# Patient Record
Sex: Female | Born: 1975 | Race: White | Hispanic: No | Marital: Single | Smoking: Current every day smoker
Health system: Southern US, Community
[De-identification: ages and names within clinical notes are randomized; demographics above are authoritative.]

## PROBLEM LIST (undated history)

## (undated) ENCOUNTER — Emergency Department (HOSPITAL_COMMUNITY): Admission: EM | Discharge status: Absent without leave | Payer: Medicaid Other

## (undated) DIAGNOSIS — F419 Anxiety disorder, unspecified: Secondary | ICD-10-CM

## (undated) DIAGNOSIS — R0600 Dyspnea, unspecified: Secondary | ICD-10-CM

## (undated) DIAGNOSIS — M479 Spondylosis, unspecified: Secondary | ICD-10-CM

## (undated) DIAGNOSIS — R51 Headache: Secondary | ICD-10-CM

## (undated) DIAGNOSIS — E782 Mixed hyperlipidemia: Secondary | ICD-10-CM

## (undated) DIAGNOSIS — G4733 Obstructive sleep apnea (adult) (pediatric): Secondary | ICD-10-CM

## (undated) DIAGNOSIS — D649 Anemia, unspecified: Secondary | ICD-10-CM

## (undated) DIAGNOSIS — F909 Attention-deficit hyperactivity disorder, unspecified type: Secondary | ICD-10-CM

## (undated) DIAGNOSIS — I10 Essential (primary) hypertension: Secondary | ICD-10-CM

## (undated) DIAGNOSIS — F1911 Other psychoactive substance abuse, in remission: Secondary | ICD-10-CM

## (undated) DIAGNOSIS — F4000 Agoraphobia, unspecified: Secondary | ICD-10-CM

## (undated) DIAGNOSIS — M797 Fibromyalgia: Secondary | ICD-10-CM

## (undated) DIAGNOSIS — E119 Type 2 diabetes mellitus without complications: Secondary | ICD-10-CM

## (undated) DIAGNOSIS — S060X9A Concussion with loss of consciousness of unspecified duration, initial encounter: Secondary | ICD-10-CM

## (undated) DIAGNOSIS — M1990 Unspecified osteoarthritis, unspecified site: Secondary | ICD-10-CM

## (undated) DIAGNOSIS — M51369 Other intervertebral disc degeneration, lumbar region without mention of lumbar back pain or lower extremity pain: Secondary | ICD-10-CM

## (undated) DIAGNOSIS — F32A Depression, unspecified: Secondary | ICD-10-CM

## (undated) DIAGNOSIS — M5136 Other intervertebral disc degeneration, lumbar region: Secondary | ICD-10-CM

## (undated) DIAGNOSIS — G4730 Sleep apnea, unspecified: Secondary | ICD-10-CM

## (undated) DIAGNOSIS — G629 Polyneuropathy, unspecified: Secondary | ICD-10-CM

## (undated) DIAGNOSIS — S060XAA Concussion with loss of consciousness status unknown, initial encounter: Secondary | ICD-10-CM

## (undated) DIAGNOSIS — F99 Mental disorder, not otherwise specified: Secondary | ICD-10-CM

## (undated) DIAGNOSIS — K219 Gastro-esophageal reflux disease without esophagitis: Secondary | ICD-10-CM

## (undated) DIAGNOSIS — J189 Pneumonia, unspecified organism: Secondary | ICD-10-CM

## (undated) DIAGNOSIS — J45909 Unspecified asthma, uncomplicated: Secondary | ICD-10-CM

## (undated) DIAGNOSIS — T7840XA Allergy, unspecified, initial encounter: Secondary | ICD-10-CM

## (undated) DIAGNOSIS — R6889 Other general symptoms and signs: Secondary | ICD-10-CM

## (undated) DIAGNOSIS — E785 Hyperlipidemia, unspecified: Secondary | ICD-10-CM

## (undated) DIAGNOSIS — J449 Chronic obstructive pulmonary disease, unspecified: Secondary | ICD-10-CM

## (undated) DIAGNOSIS — E559 Vitamin D deficiency, unspecified: Secondary | ICD-10-CM

## (undated) DIAGNOSIS — R7989 Other specified abnormal findings of blood chemistry: Secondary | ICD-10-CM

## (undated) DIAGNOSIS — G4739 Other sleep apnea: Secondary | ICD-10-CM

## (undated) DIAGNOSIS — G43909 Migraine, unspecified, not intractable, without status migrainosus: Secondary | ICD-10-CM

## (undated) DIAGNOSIS — F319 Bipolar disorder, unspecified: Secondary | ICD-10-CM

## (undated) DIAGNOSIS — M722 Plantar fascial fibromatosis: Secondary | ICD-10-CM

## (undated) HISTORY — DX: Unspecified osteoarthritis, unspecified site: M19.90

## (undated) HISTORY — PX: ESOPHAGEAL DILATION: SHX303

## (undated) HISTORY — DX: Mental disorder, not otherwise specified: F99

## (undated) HISTORY — DX: Depression, unspecified: F32.A

## (undated) HISTORY — DX: Attention-deficit hyperactivity disorder, unspecified type: F90.9

## (undated) HISTORY — DX: Agoraphobia, unspecified: F40.00

## (undated) HISTORY — PX: CYST REMOVAL HAND: SHX6279

## (undated) HISTORY — DX: Sleep apnea, unspecified: G47.30

## (undated) HISTORY — DX: Plantar fascial fibromatosis: M72.2

## (undated) HISTORY — DX: Obstructive sleep apnea (adult) (pediatric): G47.33

## (undated) HISTORY — DX: Mixed hyperlipidemia: E78.2

## (undated) HISTORY — PX: FOOT SURGERY: SHX648

## (undated) HISTORY — DX: Other psychoactive substance abuse, in remission: F19.11

## (undated) HISTORY — DX: Anxiety disorder, unspecified: F41.9

## (undated) HISTORY — DX: Essential (primary) hypertension: I10

## (undated) HISTORY — DX: Allergy, unspecified, initial encounter: T78.40XA

## (undated) HISTORY — DX: Vitamin D deficiency, unspecified: E55.9

## (undated) HISTORY — DX: Migraine, unspecified, not intractable, without status migrainosus: G43.909

## (undated) HISTORY — DX: Morbid (severe) obesity due to excess calories: E66.01

## (undated) HISTORY — DX: Hyperlipidemia, unspecified: E78.5

## (undated) HISTORY — DX: Chronic obstructive pulmonary disease, unspecified: J44.9

## (undated) HISTORY — PX: WISDOM TOOTH EXTRACTION: SHX21

## (undated) HISTORY — DX: Anemia, unspecified: D64.9

## (undated) HISTORY — DX: Other specified abnormal findings of blood chemistry: R79.89

---

## 2001-01-11 ENCOUNTER — Ambulatory Visit (HOSPITAL_COMMUNITY): Admission: RE | Admit: 2001-01-11 | Discharge: 2001-01-11 | Payer: Self-pay | Admitting: Pediatrics

## 2001-01-11 ENCOUNTER — Encounter: Payer: Self-pay | Admitting: Pediatrics

## 2001-03-11 ENCOUNTER — Encounter: Payer: Self-pay | Admitting: Pediatrics

## 2001-03-11 ENCOUNTER — Ambulatory Visit (HOSPITAL_COMMUNITY): Admission: RE | Admit: 2001-03-11 | Discharge: 2001-03-11 | Payer: Self-pay | Admitting: Pediatrics

## 2002-03-15 ENCOUNTER — Emergency Department (HOSPITAL_COMMUNITY): Admission: EM | Admit: 2002-03-15 | Discharge: 2002-03-15 | Payer: Self-pay | Admitting: *Deleted

## 2002-03-15 ENCOUNTER — Encounter: Payer: Self-pay | Admitting: Emergency Medicine

## 2002-12-10 ENCOUNTER — Emergency Department (HOSPITAL_COMMUNITY): Admission: EM | Admit: 2002-12-10 | Discharge: 2002-12-10 | Payer: Self-pay | Admitting: Emergency Medicine

## 2002-12-10 ENCOUNTER — Encounter: Payer: Self-pay | Admitting: Emergency Medicine

## 2002-12-12 ENCOUNTER — Ambulatory Visit (HOSPITAL_COMMUNITY): Admission: RE | Admit: 2002-12-12 | Discharge: 2002-12-12 | Payer: Self-pay | Admitting: Orthopaedic Surgery

## 2003-02-08 ENCOUNTER — Ambulatory Visit (HOSPITAL_COMMUNITY): Admission: RE | Admit: 2003-02-08 | Discharge: 2003-02-08 | Payer: Self-pay | Admitting: Pediatrics

## 2003-02-08 ENCOUNTER — Encounter: Payer: Self-pay | Admitting: Pediatrics

## 2003-02-26 ENCOUNTER — Ambulatory Visit: Admission: RE | Admit: 2003-02-26 | Discharge: 2003-02-26 | Payer: Self-pay | Admitting: Pediatrics

## 2004-01-18 ENCOUNTER — Ambulatory Visit (HOSPITAL_COMMUNITY): Admission: RE | Admit: 2004-01-18 | Discharge: 2004-01-18 | Payer: Self-pay | Admitting: Family Medicine

## 2004-03-21 ENCOUNTER — Ambulatory Visit (HOSPITAL_COMMUNITY): Admission: RE | Admit: 2004-03-21 | Discharge: 2004-03-21 | Payer: Self-pay | Admitting: Pediatrics

## 2004-04-25 ENCOUNTER — Ambulatory Visit (HOSPITAL_COMMUNITY): Admission: RE | Admit: 2004-04-25 | Discharge: 2004-04-25 | Payer: Self-pay | Admitting: Pulmonary Disease

## 2004-05-19 ENCOUNTER — Ambulatory Visit: Payer: Self-pay | Admitting: *Deleted

## 2004-05-20 ENCOUNTER — Ambulatory Visit (HOSPITAL_COMMUNITY): Admission: RE | Admit: 2004-05-20 | Discharge: 2004-05-20 | Payer: Self-pay | Admitting: Pulmonary Disease

## 2004-07-13 HISTORY — PX: ESOPHAGOGASTRODUODENOSCOPY: SHX1529

## 2004-09-29 ENCOUNTER — Ambulatory Visit (HOSPITAL_COMMUNITY): Admission: RE | Admit: 2004-09-29 | Discharge: 2004-09-29 | Payer: Self-pay | Admitting: Pediatrics

## 2004-12-17 ENCOUNTER — Ambulatory Visit: Payer: Self-pay | Admitting: Internal Medicine

## 2005-01-07 ENCOUNTER — Ambulatory Visit: Payer: Self-pay | Admitting: Internal Medicine

## 2005-01-07 ENCOUNTER — Ambulatory Visit (HOSPITAL_COMMUNITY): Admission: RE | Admit: 2005-01-07 | Discharge: 2005-01-07 | Payer: Self-pay | Admitting: Internal Medicine

## 2005-02-27 ENCOUNTER — Ambulatory Visit: Payer: Self-pay | Admitting: Internal Medicine

## 2005-09-08 ENCOUNTER — Encounter (HOSPITAL_COMMUNITY): Admission: RE | Admit: 2005-09-08 | Discharge: 2005-10-08 | Payer: Self-pay | Admitting: Pediatrics

## 2005-12-18 ENCOUNTER — Ambulatory Visit (HOSPITAL_COMMUNITY): Admission: RE | Admit: 2005-12-18 | Discharge: 2005-12-18 | Payer: Self-pay | Admitting: Pediatrics

## 2005-12-25 ENCOUNTER — Ambulatory Visit (HOSPITAL_COMMUNITY): Admission: RE | Admit: 2005-12-25 | Discharge: 2005-12-25 | Payer: Self-pay | Admitting: Pediatrics

## 2006-02-12 ENCOUNTER — Emergency Department (HOSPITAL_COMMUNITY): Admission: EM | Admit: 2006-02-12 | Discharge: 2006-02-12 | Payer: Self-pay | Admitting: Emergency Medicine

## 2006-10-07 ENCOUNTER — Emergency Department (HOSPITAL_COMMUNITY): Admission: EM | Admit: 2006-10-07 | Discharge: 2006-10-07 | Payer: Self-pay | Admitting: Emergency Medicine

## 2010-03-16 ENCOUNTER — Emergency Department (HOSPITAL_COMMUNITY): Admission: EM | Admit: 2010-03-16 | Discharge: 2010-03-16 | Payer: Self-pay | Admitting: Emergency Medicine

## 2010-09-25 LAB — URINALYSIS, ROUTINE W REFLEX MICROSCOPIC
Ketones, ur: NEGATIVE mg/dL
Leukocytes, UA: NEGATIVE
Protein, ur: NEGATIVE mg/dL
Specific Gravity, Urine: 1.02 (ref 1.005–1.030)
Urobilinogen, UA: 0.2 mg/dL (ref 0.0–1.0)

## 2010-09-25 LAB — DIFFERENTIAL
Basophils Absolute: 0 10*3/uL (ref 0.0–0.1)
Basophils Relative: 0 % (ref 0–1)
Eosinophils Absolute: 0.1 10*3/uL (ref 0.0–0.7)
Eosinophils Relative: 2 % (ref 0–5)
Lymphocytes Relative: 20 % (ref 12–46)
Monocytes Absolute: 0.5 10*3/uL (ref 0.1–1.0)
Neutro Abs: 4 10*3/uL (ref 1.7–7.7)
Neutrophils Relative %: 68 % (ref 43–77)

## 2010-09-25 LAB — CBC
Hemoglobin: 13.2 g/dL (ref 12.0–15.0)
MCHC: 34.3 g/dL (ref 30.0–36.0)
RBC: 4.25 MIL/uL (ref 3.87–5.11)

## 2010-09-25 LAB — PREGNANCY, URINE: Preg Test, Ur: POSITIVE

## 2010-09-25 LAB — BASIC METABOLIC PANEL
BUN: 8 mg/dL (ref 6–23)
CO2: 23 mEq/L (ref 19–32)
GFR calc Af Amer: >60 mL/min (ref >60)
GFR calc non Af Amer: >60 mL/min (ref >60)
Glucose, Bld: 110 mg/dL — ABNORMAL HIGH (ref 70–99)

## 2010-09-25 LAB — TYPE AND SCREEN: Antibody Screen: NEGATIVE

## 2010-11-28 NOTE — Op Note (Signed)
NAME:  [name], ALVERSON                          ACCOUNT NO.:  [id number]   MEDICAL RECORD NO.:  [id number]                   PATIENT TYPE:  AMB   LOCATION:  DAY                                  FACILITY:  [facility]   PHYSICIAN:  J. Darreld Mclean, M.D.              DATE OF BIRTH:  [birth year]   DATE OF PROCEDURE:  12/12/2002  DATE OF DISCHARGE:                                 OPERATIVE REPORT   PREOPERATIVE DIAGNOSIS:  Ganglion cyst, left hand long finger, volar base.   POSTOPERATIVE DIAGNOSIS:  Ganglion cyst, left hand long finger, volar base.   PROCEDURE:  Excision, ganglion cyst, off the flexor tendon to the left long  finger at the base.   ANESTHESIA:  Bier block.   TOURNIQUET TIME:  Please refer to anesthesia records for total tourniquet  time.   SURGEON:  J. Darreld Mclean, M.D.   ASSISTANT:  Candace Cruise, P.A.   DRAINS:  None.   CLINICAL HISTORY:  The patient is a 35 year old female with a history of  pain and tenderness, left long finger, because of a ganglion cyst the size  of a two small grape seeds.  It is painful when she squeezes or grabs  something.  No paresthesias.  No history of trauma.  It has not gotten any  better, and she desires to have excision.  Risks and imponderables of  excision of the procedure were explained, particularly that this thing could  recur over time.   DESCRIPTION OF PROCEDURE:  The patient was placed supine on the operating  room table and Bier block anesthesia was given.  We re-verified that we were  doing this patient and we were doing her left long finger.  A small incision  made directly over the lesion and the ganglion was identified.  The cyst was  exposed, the neurovascular bundle protected.  The cyst was right on top of  the tendon sheath and once this was brought forward, the vein was exposed.  The cyst came off very easily.  The neurovascular bundle was inspected with  no apparent injury.  The wound reapproximated using 3-0 nylon  interrupted  vertical mattress nylon.  A sterile dressing applied, bulky dressing  applied, tourniquet deflated in the usual fashion after Bier block.  The  patient tolerated the procedure well and went to recovery in good condition.  Appropriate analgesia given for pain.  Will see the patient in the office in  approximately 10 days.  If difficulty or any problems, contact me through  the office or the hospital beeper system.  She has the numbers.                                               Teola Bradley, M.D.    JWK/MEDQ  D:  12/12/2002  T:  12/12/2002  Job:  [id number]

## 2010-11-28 NOTE — Procedures (Signed)
[name], [name]                ACCOUNT NO.:  [id number]   MEDICAL RECORD NO.:  [id number]          PATIENT TYPE:  OUT   LOCATION:  RAD                           FACILITY:  [facility]   PHYSICIAN:  Edward L. Juanetta Gosling, M.D.DATE OF BIRTH:  09-03-1975   DATE OF PROCEDURE:  DATE OF DISCHARGE:  04/25/2004                              PULMONARY FUNCTION TEST   Spirometry is normal.     Edwa   ELH/MEDQ  D:  05/05/2004  T:  05/05/2004  Job:  [id number]   cc:   Jeoffrey Massed, MD  [address]  [place]  [place] [address]  Fax: [phone]

## 2010-11-28 NOTE — Op Note (Signed)
[name], [name]                ACCOUNT NO.:  [id number]   MEDICAL RECORD NO.:  [id number]          PATIENT TYPE:  AMB   LOCATION:  DAY                           FACILITY:  [facility]   PHYSICIAN:  Lionel December, M.D.    DATE OF BIRTH:  [birth year]   DATE OF PROCEDURE:  01/07/2005  DATE OF DISCHARGE:                                 OPERATIVE REPORT   PROCEDURE:  Esophagogastroduodenoscopy with esophageal dilation.   INDICATION:  [name] is a 35 year old Caucasian female with least a five-year  history of GERD, who is presently maintained on double-dose PPI.  Her  heartburn and regurgitation are well controlled, but she still has a feeling  of a lump in her throat. She also has intermittent dysphagia. She is  undergoing diagnostic/therapeutic EGD. The procedure risks were reviewed the  patient. Informed consent was obtained.   PREMEDICATION:  Cetacaine spray for pharyngeal topical anesthesia, Demerol  40 mg IV and Versed 12 mg IV.   FINDINGS:  The procedure was performed in the endoscopy suite. The patient's  vital signs and O2 saturation were monitored during the procedure and  remained stable. The patient was placed left lateral position. An Olympus  videoscope was passed via oropharynx without any difficulty into the  esophagus.   Esophagus:  The mucosa of the esophagus was normal throughout. The GE  junction was at 36 cm and hiatus at 38. She had a small sliding hiatal  hernia.   Stomach:  It was empty and distended very well with insufflation. Folds of  the proximal stomach were normal. Examination mucosa at the gastric body,  antrum, pyloric channel, as well as angularis, fundus and cardia was normal.   Duodenum:  Bulbar mucosa was normal. The scope was passed into the second  part of duodenum. The mucosa and folds were normal. The endoscope was  withdrawn.   The esophagus was dilated by passing a 56 [place] Maloney dilator to full  insertion. As the dilator was withdrawn, the  endoscope was passed again and  there was no mucosal injury noted to the esophagus. The endoscope was  withdrawn. The patient tolerated the procedure well.   FINAL DIAGNOSIS:  Small sliding hiatal hernia, otherwise normal  esophagogastroduodenoscopy.   The esophagus was dilated by passing a 56 [place] Maloney dilator.   RECOMMENDATIONS:  Antireflux measures reinforced. Will switch her to Xigris  40 mg b.i.d. She will pick up samples from the office. She will call the  office with a progress report. If she does not do any better with Xigris,  will try another PPI prior to considering pH study.       NR/MEDQ  D:  01/07/2005  T:  01/07/2005  Job:  [id number]   cc:   Jeoffrey Massed, MD  [address]  [place]  [place] [address]  Fax: [phone]

## 2010-11-28 NOTE — Procedures (Signed)
NAMECARRA, BRINDLEY                ACCOUNT NO.:  [id number]   MEDICAL RECORD NO.:  [id number]          PATIENT TYPE:  OUT   LOCATION:  RAD                           FACILITY:  [facility]   PHYSICIAN:  Vida Roller, M.D.   DATE OF BIRTH:  Apr 22, 1976   DATE OF PROCEDURE:  05/20/2004  DATE OF DISCHARGE:                                  ECHOCARDIOGRAM   Tape #[id number].  Tape count is [phone].   REASON FOR CONSULTATION:  This is a 35 year old woman with shortness of  breath and obesity   FINDINGS:  Technical quality of the study is difficult.   M-MODE TRACING:  Aorta - 30 mm.  Left atrium - 49 mm.  Septum - 12 mm.  Posterior wall - 12 mm.  Left ventricular diastolic dimension - 47 mm.  Left ventricular systolic dimension - 36 mm.   2-D AND DOPPLER IMAGING:  The left ventricle is normal size with mildly  depressed left ventricular systolic function estimated at 50-55% although  this is a difficult assessment due to the technical quality of the study.   The right ventricle appears to be mildly dilated with mildly depressed  systolic function.   Both atria are also dilated.   The aortic valve is morphologic unremarkable with no evidence of stenosis or  regurgitation.   The mitral valve is morphologic unremarkable with no stenosis or  regurgitation.   The tricuspid valve has no regurgitation.   The pulmonic valve is not well seen.   The inferior vena cava appears to be normal size.   There is no pericardial effusion.   The ascending aorta is not well seen.     Trey Paula   JH/MEDQ  D:  05/20/2004  T:  05/20/2004  Job:  [id number]

## 2010-11-28 NOTE — Consult Note (Signed)
[name]                ACCOUNT NO.:  [id number]   MEDICAL RECORD NO.:  [id number]           PATIENT TYPE:   LOCATION:                                 FACILITY:   PHYSICIAN:  Lionel December, M.D.    DATE OF BIRTH:  [birth year]   DATE OF CONSULTATION:  12/17/2004  DATE OF DISCHARGE:                                   CONSULTATION   CHIEF COMPLAINT:  Trouble swallowing, acid reflux.   REQUESTING PHYSICIAN:  Jeoffrey Massed, MD   HISTORY OF PRESENT ILLNESS:  Patient is a 35 year old Caucasian female with  history of chronic gastroesophageal reflux disease and chronic constipation  who presents today for further evaluation of dysphagia.  For the past three  months she has had a sensation of fullness in her throat.  She notes is more  and more throughout the day and especially at nighttime.  At times she feels  like it may be worse with postnasal drip.  When she swallows saliva she  feels like she has a lump in her throat.  When she eats sticky foods or  foods with nuts she really has the problem.  She denies any nausea or  vomiting, odynophagia.  Her heartburn symptoms are intermittently  controlled.  Six months ago her allergist increased her Prevacid to b.i.d.  Her nocturnal heartburn has been better.  She has chronic constipation,  generally has three bowel movements.  She takes Milk of Magnesia at least  two to three times a week with good results.  Denies melena or rectal  bleeding.  She does have abdominal cramps at times with bowel movements.  Denies any nausea or vomiting.  She never had an upper endoscopy.  She  reports having an ultrasound of her neck which was negative.   CURRENT MEDICATIONS:  1.  Prevacid 30 mg b.i.d.  2.  Advair b.i.d.  3.  Xanax 0.25 mg q.h.s.  4.  Remeron 30 mg q.h.s.  5.  Singulair 10 mg q.h.s.  6.  Allegra D b.i.d.  7.  Cranberry with vitamin C two b.i.d.  8.  Gingko daily.   ALLERGIES:  PENICILLIN causes rash.   PAST MEDICAL  HISTORY:  1.  Gastroesophageal reflux disease.  2.  Asthma.  3.  Allergies.  4.  Depression.  5.  Chronic constipation.  6.  Left hand ganglion cyst removal.   FAMILY HISTORY:  Negative for colorectal cancer.  Grandmother has history of  peptic ulcer disease and hiatal hernia and has had surgery for this.   SOCIAL HISTORY:  She is single.  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  She is a Physicist, medical in business administration.  She  occasionally smokes approximately four times a year.  She rarely consumes  alcohol.   REVIEW OF SYSTEMS:  See HPI for GI.  CONSTITUTIONAL:  Denies weight loss.  CARDIOPULMONARY:  She has had some difficulty with asthma during this last  month or two with her allergies, has some wheezing intermittently.  No chest  pain or productive cough.   PHYSICAL EXAMINATION:  VITAL SIGNS:  Weight 314, temperature 98.2, blood  pressure 124/70, pulse 72.  GENERAL:  Pleasant, morbidly obese Caucasian female in no acute distress.  SKIN:  Warm and dry, no jaundice.  HEENT:  Pupils are equal, round, and reactive to light.  Conjunctivae are  pink.  Sclerae nonicteric.  Oropharyngeal mucosa moist and pink.  No  lesions, erythema, or exudate.  No lymphadenopathy, thyromegaly.  CHEST:  Lungs are clear to auscultation.  CARDIAC:  Regular rate and rhythm.  Normal S1, S2.  No murmurs, rubs, or  gallops.  ABDOMEN:  Positive bowel sounds, obese, but symmetrical.  Soft, nontender.  No organomegaly or masses appreciated, but limited due to body habitus.  EXTREMITIES:  No edema.   IMPRESSION:  [name] is a 35 year old lady with chronic gastroesophageal  reflux disease of more than five years' duration who for the past three  months has had a feeling of fullness in her throat and difficulty swallowing  certain foods.  This may be globus hystericus; however, given history of  gastroesophageal reflux disease and new onset symptoms we do need to  consider  esophageal stricture, web, or ring.  She has never had an upper  endoscopy and needs to have one for surveillance purposes to rule out  complications such as Barrett's esophagus as well.  With regards to her  chronic constipation she is content with her current regimen of Milk of  Magnesia and is doing quite well.   PLAN:  1.  EGD plus/minus esophageal dilatation in the near future.  2.  She will continue Prevacid 30 mg b.i.d. for now.  3.  If she fails to have any response to esophageal dilatation and no      findings on EGD she may ultimately need to have ENT referral.       LL/MEDQ  D:  12/17/2004  T:  12/17/2004  Job:  [id number]

## 2010-11-28 NOTE — Procedures (Signed)
NAMESUSAN, [name]                ACCOUNT NO.:  [id number]   MEDICAL RECORD NO.:  [id number]          PATIENT TYPE:  OUT   LOCATION:  RESP                          FACILITY:  [facility]   PHYSICIAN:  Edward L. Juanetta Gosling, M.D.DATE OF BIRTH:  Aug 11, 1975   DATE OF PROCEDURE:  DATE OF DISCHARGE:                              PULMONARY FUNCTION TEST   1.  Spirometry shows no definite ventilatory defect with evidence of airflow      obstruction.  2.  Lung volumes are normal.  3.  DLCO is moderately reduced.  4.  Arterial blood gases are normal.      Edward L. Juanetta Gosling, M.D.  Electronically Signed     ELH/MEDQ  D:  12/26/2005  T:  12/26/2005  Job:  [id number]   cc:   Francoise Schaumann. Milford Cage DO, FAAP  Fax: [phone]

## 2012-01-09 ENCOUNTER — Other Ambulatory Visit: Payer: Self-pay | Admitting: Family Medicine

## 2012-12-09 ENCOUNTER — Encounter (HOSPITAL_COMMUNITY): Payer: Self-pay | Admitting: *Deleted

## 2012-12-09 ENCOUNTER — Emergency Department (HOSPITAL_COMMUNITY)
Admission: EM | Admit: 2012-12-09 | Discharge: 2012-12-09 | Disposition: A | Discharge status: Other Acute Inpatient Hospital | Payer: MEDICAID | Source: Home / Self Care

## 2012-12-09 ENCOUNTER — Inpatient Hospital Stay (HOSPITAL_COMMUNITY)
Admission: AD | Admit: 2012-12-09 | Discharge: 2012-12-13 | DRG: 885 | Disposition: A | Discharge status: Home / Self Care | Payer: MEDICAID | Source: Intra-hospital | Attending: Psychiatry | Admitting: Psychiatry

## 2012-12-09 DIAGNOSIS — F29 Unspecified psychosis not due to a substance or known physiological condition: Secondary | ICD-10-CM

## 2012-12-09 DIAGNOSIS — J45909 Unspecified asthma, uncomplicated: Secondary | ICD-10-CM | POA: Diagnosis present

## 2012-12-09 DIAGNOSIS — Z79899 Other long term (current) drug therapy: Secondary | ICD-10-CM

## 2012-12-09 DIAGNOSIS — E876 Hypokalemia: Secondary | ICD-10-CM | POA: Diagnosis present

## 2012-12-09 DIAGNOSIS — M797 Fibromyalgia: Secondary | ICD-10-CM

## 2012-12-09 DIAGNOSIS — F3160 Bipolar disorder, current episode mixed, unspecified: Principal | ICD-10-CM | POA: Diagnosis present

## 2012-12-09 DIAGNOSIS — R45851 Suicidal ideations: Secondary | ICD-10-CM

## 2012-12-09 DIAGNOSIS — F3162 Bipolar disorder, current episode mixed, moderate: Secondary | ICD-10-CM

## 2012-12-09 DIAGNOSIS — IMO0001 Reserved for inherently not codable concepts without codable children: Secondary | ICD-10-CM | POA: Diagnosis present

## 2012-12-09 HISTORY — DX: Unspecified asthma, uncomplicated: J45.909

## 2012-12-09 HISTORY — DX: Fibromyalgia: M79.7

## 2012-12-09 HISTORY — DX: Headache: R51

## 2012-12-09 LAB — RAPID URINE DRUG SCREEN, HOSP PERFORMED
Cocaine: NOT DETECTED
Opiates: POSITIVE — AB
Tetrahydrocannabinol: NOT DETECTED

## 2012-12-09 LAB — URINALYSIS, ROUTINE W REFLEX MICROSCOPIC
Leukocytes, UA: NEGATIVE
Nitrite: NEGATIVE
Specific Gravity, Urine: >1.03 — ABNORMAL HIGH (ref 1.005–1.030)
pH: 5.5 (ref 5.0–8.0)

## 2012-12-09 LAB — ETHANOL: Alcohol, Ethyl (B): <11 mg/dL (ref 0–11)

## 2012-12-09 LAB — COMPREHENSIVE METABOLIC PANEL
ALT: 12 U/L (ref 0–35)
AST: 17 U/L (ref 0–37)
Albumin: 4.4 g/dL (ref 3.5–5.2)
Alkaline Phosphatase: 92 U/L (ref 39–117)
Calcium: 9.2 mg/dL (ref 8.4–10.5)
GFR calc non Af Amer: 81 mL/min — ABNORMAL LOW (ref >90)
Glucose, Bld: 107 mg/dL — ABNORMAL HIGH (ref 70–99)
Sodium: 140 mEq/L (ref 135–145)

## 2012-12-09 LAB — URINE MICROSCOPIC-ADD ON

## 2012-12-09 LAB — CBC
HCT: 40.7 % (ref 36.0–46.0)
Hemoglobin: 14.3 g/dL (ref 12.0–15.0)
MCHC: 35.1 g/dL (ref 30.0–36.0)
MCV: 91.7 fL (ref 78.0–100.0)
RBC: 4.44 MIL/uL (ref 3.87–5.11)

## 2012-12-09 MED ORDER — LORAZEPAM 2 MG/ML IJ SOLN
1.0000 mg | Freq: Once | INTRAMUSCULAR | Status: AC
  Administered 2012-12-09: 1 mg via INTRAVENOUS
  Filled 2012-12-09: qty 1

## 2012-12-09 MED ORDER — SODIUM CHLORIDE 0.9 % IV SOLN
INTRAVENOUS | Status: DC
  Administered 2012-12-09: 03:00:00 via INTRAVENOUS

## 2012-12-09 MED ORDER — ZIPRASIDONE MESYLATE 20 MG IM SOLR
20.0000 mg | Freq: Once | INTRAMUSCULAR | Status: AC
  Administered 2012-12-09: 20 mg via INTRAMUSCULAR

## 2012-12-09 MED ORDER — DIPHENHYDRAMINE HCL 50 MG/ML IJ SOLN
25.0000 mg | Freq: Once | INTRAMUSCULAR | Status: AC
Start: 2012-12-09 — End: 2012-12-09
  Administered 2012-12-09: 25 mg via INTRAVENOUS
  Filled 2012-12-09: qty 1

## 2012-12-09 NOTE — ED Notes (Signed)
Pt sleeping at present, normal rise and fall of chest. Officer at bedside, pt/ shackle to right lower leg.

## 2012-12-09 NOTE — ED Notes (Addendum)
Pt here screaming that someone has taken her baby, she has been shot with a gun, she is going to hell, and just keeps talking on and on about how bad her life is. Brother states pt is schizophrenic but has not been diagnosed.

## 2012-12-09 NOTE — ED Notes (Signed)
[name] (ACT) will be coming to see the Pt.  Nurse aware.

## 2012-12-09 NOTE — ED Provider Notes (Signed)
History     CSN: [id number]  Arrival date & time 12/09/12  0024   First MD Initiated Contact with Patient 12/09/12 0037      Chief Complaint  Patient presents with  . V70.1    (Consider location/radiation/quality/duration/timing/severity/associated sxs/prior treatment) HPI Hx per EMS and patients brother - reportedly is prescribed psychiatric medications and he is unable to say if she has been taking them.  Tonight police were called for bizarre behavior at home. She has a 79 y/o daughter at home but began screaming that someone has taken her baby, that 32 men took her baby then shot her and that she is going to hell.  EMS called, provided ativan with no relief. On arrival to the ED, continues to have persistent outbursts, yelling, acting bizarre, unable to provide any reliable history or answer questions. Geodon ordered. Level 5 caveat applies.   History reviewed. No pertinent past medical history.  History reviewed. No pertinent past surgical history.  No family history on file.  History  Substance Use Topics  . Smoking status: Not on file  . Smokeless tobacco: Not on file  . Alcohol Use: Not on file    OB History   Grav Para Term Preterm Abortions TAB SAB Ect Mult Living                  Review of Systems  Unable to perform ROS level 5 caveat applies  Allergies  Review of patient's allergies indicates not on file.  Home Medications  No current outpatient prescriptions on file.  There were no vitals taken for this visit.  Physical Exam  Constitutional: She appears well-developed and well-nourished.  HENT:  Head: Normocephalic and atraumatic.  Eyes: EOM are normal. Pupils are equal, round, and reactive to light.  Neck: Neck supple.  Cardiovascular: Regular rhythm and intact distal pulses.   Pulmonary/Chest: Effort normal. No respiratory distress.  Musculoskeletal: Normal range of motion. She exhibits no edema.  Neurological:  Awake, ambulating, no gross  motor deficits   Skin: Skin is warm and dry.  Psychiatric:  Bizarre and uncooperative behavior    ED Course  Procedures (including critical care time)  Results for orders placed during the hospital encounter of 12/09/12  CBC      Result Value Range   WBC 9.9  4.0 - 10.5 K/uL   RBC 4.44  3.87 - 5.11 MIL/uL   Hemoglobin 14.3  12.0 - 15.0 g/dL   HCT 78.4  69.6 - 29.5 %   MCV 91.7  78.0 - 100.0 fL   MCH 32.2  26.0 - 34.0 pg   MCHC 35.1  30.0 - 36.0 g/dL   RDW 28.4  13.2 - 44.0 %   Platelets 206  150 - 400 K/uL  COMPREHENSIVE METABOLIC PANEL      Result Value Range   Sodium 140  135 - 145 mEq/L   Potassium 3.3 (*) 3.5 - 5.1 mEq/L   Chloride 104  96 - 112 mEq/L   CO2 24  19 - 32 mEq/L   Glucose, Bld 107 (*) 70 - 99 mg/dL   BUN 8  6 - 23 mg/dL   Creatinine, Ser 1.02  0.50 - 1.10 mg/dL   Calcium 9.2  8.4 - 72.5 mg/dL   Total Protein 7.5  6.0 - 8.3 g/dL   Albumin 4.4  3.5 - 5.2 g/dL   AST 17  0 - 37 U/L   ALT 12  0 - 35 U/L   Alkaline  Phosphatase 92  39 - 117 U/L   Total Bilirubin 0.4  0.3 - 1.2 mg/dL   GFR calc non Af Amer 81 (*) >90 mL/min   GFR calc Af Amer >90  >90 mL/min  URINALYSIS, ROUTINE W REFLEX MICROSCOPIC      Result Value Range   Color, Urine YELLOW  YELLOW   APPearance HAZY (*) CLEAR   Specific Gravity, Urine >1.030 (*) 1.005 - 1.030   pH 5.5  5.0 - 8.0   Glucose, UA NEGATIVE  NEGATIVE mg/dL   Hgb urine dipstick TRACE (*) NEGATIVE   Bilirubin Urine NEGATIVE  NEGATIVE   Ketones, ur TRACE (*) NEGATIVE mg/dL   Protein, ur 30 (*) NEGATIVE mg/dL   Urobilinogen, UA 0.2  0.0 - 1.0 mg/dL   Nitrite NEGATIVE  NEGATIVE   Leukocytes, UA NEGATIVE  NEGATIVE  URINE RAPID DRUG SCREEN (HOSP PERFORMED)      Result Value Range   Opiates POSITIVE (*) NONE DETECTED   Cocaine NONE DETECTED  NONE DETECTED   Benzodiazepines POSITIVE (*) NONE DETECTED   Amphetamines NONE DETECTED  NONE DETECTED   Tetrahydrocannabinol NONE DETECTED  NONE DETECTED   Barbiturates NONE DETECTED   NONE DETECTED  ETHANOL      Result Value Range   Alcohol, Ethyl (B) <11  0 - 11 mg/dL  ACETAMINOPHEN LEVEL      Result Value Range   Acetaminophen (Tylenol), Serum <15.0  10 - 30 ug/mL  SALICYLATE LEVEL      Result Value Range   Salicylate Lvl <2.0 (*) 2.8 - 20.0 mg/dL  URINE MICROSCOPIC-ADD ON      Result Value Range   Squamous Epithelial / LPF MANY (*) RARE   WBC, UA 0-2  <3 WBC/hpf   RBC / HPF 0-2  <3 RBC/hpf   Bacteria, UA MANY (*) RARE   Urine-Other AMORPHOUS URATES/PHOSPHATES      Date: 12/09/2012  Rate: 83  Rhythm: normal sinus rhythm  QRS Axis: normal  Intervals: PR prolonged  ST/T Wave abnormalities: nonspecific ST changes  Conduction Disutrbances:first-degree A-V block   Narrative Interpretation:   Old EKG Reviewed: none available  CRITICAL CARE Performed by: Chamika Cunanan Total critical care time: 30 Critical care time was exclusive of separately billable procedures and treating other patients. Critical care was necessary to treat or prevent imminent or life-threatening deterioration. Critical care was time spent personally by me on the following activities: development of treatment plan with patient and/or surrogate as well as nursing, discussions with consultants, evaluation of patient's response to treatment, examination of patient, obtaining history from patient or surrogate, ordering and performing treatments and interventions, ordering and review of laboratory studies, ordering and review of radiographic studies, pulse oximetry and re-evaluation of patient's condition.   Ativan in route, remains very psychotic in the ED, given geodon, ativan and benadryl on arrival. Serial attempted evaluations, bizarre behavior.  IVC  12:59 AM [employer] and security bedside, requiring more IV ativan  3:46 AM labs and UDS reviewed. Resting at this time. Old records reviewed - no apparent psych admits or psych history.  6:45 AM NAD, sheriff deputy bedside, remains in hand cuffs, IVF,  plan psych dispo  MDM  Acute psychosis Geodon, ativan, benadryl IVC ECG, labs, UA/ UDS Serial evaluations         Sunnie Nielsen, MD 12/09/12 [phone]

## 2012-12-09 NOTE — ED Notes (Signed)
Unable to obtain assessment on patient due to her being sleep from meds given to calm her down

## 2012-12-09 NOTE — ED Notes (Signed)
Pt calling rcsd names and started to spit in his face. Also making rude comments.

## 2012-12-09 NOTE — ED Notes (Signed)
Pt cont. To sleep w/ snoring resp, normal rise and fall of chest. Officer at bedside. Pt did not eat any of meal tray that was at bedside. Tray removed from exam room.
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
## 2012-12-09 NOTE — BH Assessment (Signed)
BHH Assessment Progress Note      Consulted with Dahlia Byes NP re admission for this patient that is currently at [facility]. She has accepted her for [id number] to Dr. Gloris Manchester service.

## 2012-12-09 NOTE — ED Notes (Signed)
Patient in room snoring. No distress noted or voiced

## 2012-12-09 NOTE — ED Notes (Signed)
[name] from act to see the pt,

## 2012-12-09 NOTE — ED Notes (Signed)
Meal tray placed at patients bedside, pt still remains w/ snoring resp. Will reheat tray when she awakens. Officer at bedside.

## 2012-12-10 ENCOUNTER — Encounter (HOSPITAL_COMMUNITY): Payer: Self-pay | Admitting: *Deleted

## 2012-12-10 DIAGNOSIS — F3160 Bipolar disorder, current episode mixed, unspecified: Principal | ICD-10-CM

## 2012-12-10 DIAGNOSIS — F332 Major depressive disorder, recurrent severe without psychotic features: Secondary | ICD-10-CM

## 2012-12-10 MED ORDER — ACETAMINOPHEN 325 MG PO TABS
650.0000 mg | ORAL_TABLET | Freq: Four times a day (QID) | ORAL | Status: DC | PRN
  Administered 2012-12-10: 650 mg via ORAL

## 2012-12-10 MED ORDER — RISPERIDONE 1 MG PO TABS
1.0000 mg | ORAL_TABLET | Freq: Every day | ORAL | Status: DC
  Administered 2012-12-10: 1 mg via ORAL
  Filled 2012-12-10 (×3): qty 1

## 2012-12-10 MED ORDER — LORAZEPAM 2 MG/ML IJ SOLN: 1.0000 mg | Freq: Four times a day (QID) | INTRAMUSCULAR | Status: DC | PRN

## 2012-12-10 MED ORDER — CIPROFLOXACIN HCL 500 MG PO TABS
500.0000 mg | ORAL_TABLET | Freq: Two times a day (BID) | ORAL | Status: AC
  Administered 2012-12-10 – 2012-12-12 (×6): 500 mg via ORAL
  Filled 2012-12-10 (×6): qty 1

## 2012-12-10 MED ORDER — ALUM & MAG HYDROXIDE-SIMETH 200-200-20 MG/5ML PO SUSP: 30.0000 mL | ORAL | Status: DC | PRN

## 2012-12-10 MED ORDER — LORAZEPAM 1 MG PO TABS
1.0000 mg | ORAL_TABLET | Freq: Four times a day (QID) | ORAL | Status: DC | PRN
  Administered 2012-12-10 – 2012-12-11 (×2): 1 mg via ORAL
  Filled 2012-12-10 (×2): qty 1

## 2012-12-10 MED ORDER — MAGNESIUM HYDROXIDE 400 MG/5ML PO SUSP: 30.0000 mL | Freq: Every day | ORAL | Status: DC | PRN

## 2012-12-10 MED ORDER — BUPROPION HCL ER (XL) 150 MG PO TB24
150.0000 mg | ORAL_TABLET | Freq: Every day | ORAL | Status: DC
  Administered 2012-12-10 – 2012-12-12 (×3): 150 mg via ORAL
  Filled 2012-12-10 (×4): qty 1

## 2012-12-10 MED ORDER — TRAZODONE HCL 50 MG PO TABS: 50.0000 mg | ORAL_TABLET | Freq: Every evening | ORAL | Status: DC | PRN

## 2012-12-10 NOTE — BHH Suicide Risk Assessment (Signed)
Suicide Risk Assessment  Admission Assessment     Nursing information obtained from:  Patient Demographic factors:  Adolescent or young adult;Caucasian;Gay, lesbian, or bisexual orientation Current Mental Status:  NA Loss Factors:  NA Historical Factors:  Family history of mental illness or substance abuse Risk Reduction Factors:  Responsible for children under 36 years of age  CLINICAL FACTORS:   Bipolar Disorder:   Mixed State Depression:   Anhedonia Comorbid alcohol abuse/dependence Impulsivity Currently Psychotic  COGNITIVE FEATURES THAT CONTRIBUTE TO RISK:  Closed-mindedness Loss of executive function Polarized thinking Thought constriction (tunnel vision)    SUICIDE RISK:   Moderate:  Frequent suicidal ideation with limited intensity, and duration, some specificity in terms of plans, no associated intent, good self-control, limited dysphoria/symptomatology, some risk factors present, and identifiable protective factors, including available and accessible social support.  PLAN OF CARE:  I certify that inpatient services furnished can reasonably be expected to improve the patient's condition.  Judith Cheese T. 12/10/2012, 10:52 AM

## 2012-12-10 NOTE — BHH Group Notes (Signed)
BHH Group Notes:  (Clinical Social Work)  12/10/2012  11:15-11:45AM  Summary of Progress/Problems:   The main focus of today's process group was for the patient to identify ways in which they have in the past sabotaged their own recovery and reasons they may have done this/what they received from doing it.  We then worked to identify a specific plan to avoid doing this when discharged from the hospital for this admission.  The patient expressed that she drinks alcohol to cover her feelings and to make her numb from those feelings.  She is afraid of losing people in her life so she pushes them away.  She stated she needs to stay on her medications to stay well in the future.  Type of Therapy:  Group Therapy - Process  Participation Level:  Active  Participation Quality:  Attentive, Sharing and Supportive  Affect:  Blunted and Depressed  Cognitive:  Appropriate and Oriented  Insight:  Engaged  Engagement in Therapy:  Engaged  Modes of Intervention:  Clarification, Education, Exploration, Discussion  Ambrose Mantle, LCSW 12/10/2012, 1:18 PM

## 2012-12-10 NOTE — Progress Notes (Signed)
Patient ID: [name], female   DOB: 02/04/1976, 37 y.o.   MRN: [id number] This is an involuntary admission of a 37 y.o. S/W/F with a Dx of Psychotic D/O NOS. The patient presents as guarded and is forwarding little information. She claims she has no recollection of the events that led to her admission last night. She stated that when she woke up this morning she was in the hospital with no memory of what led up to her admission. She volunteered very little information regarding medical, social or prior psychiatric history. She gave conflicting stories regarding prior and present substance abuse, but claims she has not had any etoh for over three weeks. Reports she drinks once or twice a week. Denies any prior psych history, but has old scars on her left arm that she reports are from self mutilating. Denies any suicidal/homicidal ideation. Verbally contracts for safety. Denies any A/V hallucinations. States that she lives with her 28 y.o. daughter, brother and her female partner. She identifies all of them as her support system. Reports that she has Fibromyalgia and has weakness in both of her legs and arms. Placed as a moderate fall risk. The fall risk plan was reviewed with her.

## 2012-12-10 NOTE — Progress Notes (Signed)
D: Patient denies SI/HI/AVH.  Patient affect and mood are depressed. .  Pt states that she slept poorly and her appetite is poor.  Pt states that her energy level is low and her ability to pay attention is poor.  Pt isolative.  She remained in her room and slept most of the day.  Pt stated "Things are beginning to become more clear."  Patient did NOT attend group. No distress noted. A: Support and encouragement offered. Scheduled medications given to pt. Q 15 min checks continued for patient safety. R: Patient receptive. Patient remains safe on the unit.

## 2012-12-10 NOTE — Tx Team (Signed)
Initial Interdisciplinary Treatment Plan  PATIENT STRENGTHS: (choose at least two) Average or above average intelligence Supportive family/friends  PATIENT STRESSORS: Marital or family conflict   PROBLEM LIST: Problem List/Patient Goals Date to be addressed Date deferred Reason deferred Estimated date of resolution  Psychotic episode 12/09/12                                                      DISCHARGE CRITERIA:  Ability to meet basic life and health needs Improved stabilization in mood, thinking, and/or behavior Need for constant or close observation no longer present Verbal commitment to aftercare and medication compliance  PRELIMINARY DISCHARGE PLAN: Outpatient therapy Return to previous living arrangement  PATIENT/FAMIILY INVOLVEMENT: This treatment plan has been presented to and reviewed with the patient, Judith, [name] 12/10/2012, 1:43 AM
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
## 2012-12-10 NOTE — Progress Notes (Signed)
Patient ID: [name], female   DOB: 11-05-1975, 37 y.o.   MRN: [id number] Psychoeducational Group Note  Date:  12/10/2012 Time:1000am  Group Topic/Focus:  Identifying Needs:   The focus of this group is to help patients identify their personal needs that have been historically problematic and identify healthy behaviors to address their needs.  Participation Level:  Active  Participation Quality:  Appropriate  Affect:  Appropriate  Cognitive:  Appropriate  Insight:  Supportive  Engagement in Group:  Supportive  Additional Comments:  Inventory and Psychoeducational group   Valente David 12/10/2012,10:21 AM

## 2012-12-11 MED ORDER — RISPERIDONE 2 MG PO TABS
2.0000 mg | ORAL_TABLET | Freq: Every day | ORAL | Status: DC
  Administered 2012-12-11 – 2012-12-12 (×2): 2 mg via ORAL
  Filled 2012-12-11 (×3): qty 1
  Filled 2012-12-11: qty 3

## 2012-12-11 NOTE — Progress Notes (Signed)
[facility] MD Progress Note  12/11/2012 11:00 AM [name]  MRN:  [id number] Subjective:   I did not slept last night.  Objective; Patient seen chart reviewed.  Patient complaining of insomnia.  She continued to endorse visual hallucination and passive suicidal thinking.  She's compliant with the medication.  Despite taking Risperdal 1 mg and trazodone 50 mg she did not have a good night sleep.  She continues to think about her family members.  She admitted hopeless and helpless.  She contracts for safety however she continued to endorse passive suicidal thinking.  She's taking antibiotic for her UTI.  Her symptoms are better from the past.  Diagnosis:   Axis I: Bipolar, mixed Axis II: Deferred Axis III:  Past Medical History  Diagnosis Date  . Asthma   . Fibromyalgia   . Headache(784.0)    Axis IV: other psychosocial or environmental problems and problems related to social environment  ADL's:  Intact  Sleep: Poor  Appetite:  Fair  Suicidal Ideation:  Plan:  none Intent:    none Means:  None Homicidal Ideation:  Plan:  None AEB (as evidenced by):  Psychiatric Specialty Exam: Review of Systems  Constitutional: Positive for malaise/fatigue.  Genitourinary: Positive for urgency.  Skin: Negative.   Neurological: Positive for headaches. Negative for dizziness, sensory change, speech change, focal weakness, seizures and loss of consciousness.  Psychiatric/Behavioral: Positive for depression and hallucinations. The patient has insomnia.     Blood pressure 119/87, pulse 80, temperature 98.4 F (36.9 C), temperature source Oral, resp. rate 20, height 5' 5.5" (1.664 m), weight 125.335 kg (276 lb 5 oz), last menstrual period 11/24/2012.Body mass index is 45.27 kg/(m^2).  General Appearance: Casual  Eye Contact::  Fair  Speech:  Normal Rate  Volume:  Decreased  Mood:  Anxious, Depressed and Hopeless  Affect:  Depressed and Restricted  Thought Process:  Circumstantial   Orientation:  Full (Time, Place, and Person)  Thought Content:  Rumination  Suicidal Thoughts:  Yes.  without intent/plan  Homicidal Thoughts:  No  Memory:  Alert and oriented x3  Judgement:  Impaired  Insight:  Shallow  Psychomotor Activity:  Decreased  Concentration:  Fair  Recall:  Fair  Akathisia:  No  Handed:  Right  AIMS (if indicated):     Assets:  Housing Social Support  Sleep:  Number of Hours: 6.75   Current Medications: Current Facility-Administered Medications  Medication Dose Route Frequency Provider Last Rate Last Dose  . acetaminophen (TYLENOL) tablet 650 mg  650 mg Oral Q6H PRN Earney Navy, NP   650 mg at 12/10/12 0804  . alum & mag hydroxide-simeth (MAALOX/MYLANTA) 200-200-20 MG/5ML suspension 30 mL  30 mL Oral Q4H PRN Earney Navy, NP      . buPROPion (WELLBUTRIN XL) 24 hr tablet 150 mg  150 mg Oral Daily Cleotis Nipper, MD   150 mg at 12/11/12 0804  . ciprofloxacin (CIPRO) tablet 500 mg  500 mg Oral BID Sanjuana Kava, NP   500 mg at 12/11/12 0805  . LORazepam (ATIVAN) tablet 1 mg  1 mg Oral Q6H PRN Earney Navy, NP   1 mg at 12/11/12 0805   Or  . LORazepam (ATIVAN) injection 1 mg  1 mg Intramuscular Q6H PRN Earney Navy, NP      . magnesium hydroxide (MILK OF MAGNESIA) suspension 30 mL  30 mL Oral Daily PRN Earney Navy, NP      . risperiDONE (RISPERDAL) tablet  1 mg  1 mg Oral QHS Cleotis Nipper, MD   1 mg at 12/10/12 2101  . traZODone (DESYREL) tablet 50 mg  50 mg Oral QHS PRN,MR X 1 Earney Navy, NP        Lab Results: No results found for this or any previous visit (from the past 48 hour(s)).  Physical Findings: AIMS: Facial and Oral Movements Muscles of Facial Expression: None, normal Lips and Perioral Area: None, normal Jaw: None, normal Tongue: None, normal,Extremity Movements Upper (arms, wrists, hands, fingers): None, normal Lower (legs, knees, ankles, toes): None, normal,  , Overall Severity Severity of  abnormal movements (highest score from questions above): None, normal Incapacitation due to abnormal movements: None, normal Patient's awareness of abnormal movements (rate only patient's report): No Awareness, Dental Status Current problems with teeth and/or dentures?: No Does patient usually wear dentures?: Yes (top dentures)  CIWA:  CIWA-Ar Total: 0 COWS:     Treatment Plan Summary: Daily contact with patient to assess and evaluate symptoms and progress in treatment Medication management I will discontinue trazodone.  Increase Risperdal to milligram at bedtime  Plan:  Medical Decision Making Problem Points:  Established problem, stable/improving (1), Established problem, worsening (2), New problem, with additional work-up planned (4), Review of last therapy session (1) and Review of psycho-social stressors (1) Data Points:  Review or order clinical lab tests (1) Review of medication regiment & side effects (2) Review of new medications or change in dosage (2)  I certify that inpatient services furnished can reasonably be expected to improve the patient's condition.   [name] T. 12/11/2012, 11:00 AM

## 2012-12-11 NOTE — Progress Notes (Signed)
Patient ID: [name], female   DOB: [birth year], 37 y.o.   MRN: [id number] Psychoeducational Group Note  Date:  12/11/2012 Time:  1000am  Group Topic/Focus:  Making Healthy Choices:   The focus of this group is to help patients identify negative/unhealthy choices they were using prior to admission and identify positive/healthier coping strategies to replace them upon discharge.  Participation Level:  Active  Participation Quality:  Appropriate  Affect:  Anxious  Cognitive:  Appropriate  Insight:  Supportive  Engagement in Group:  Supportive  Additional Comments:  Inventory and Psychoeducational group   Valente David 12/11/2012,10:15 AM

## 2012-12-11 NOTE — [facility] Group Notes (Signed)
[facility] Group Notes:  (Clinical Social Work)  12/11/2012   11:15-11:45AM  Summary of Progress/Problems:  The main focus of today's process group was to listen to a variety of genres of music and to identify that different types of music provoke different responses.  The patient then was able to identify personally what was soothing for them, as well as energizing.  Handouts were used to record feelings evoked, as well as how patient can personally use this knowledge in sleep habits, with depression, and with other symptoms.  The patient expressed complete understanding of the activity and how to use the gathered knowledge.  Type of Therapy:  Music Therapy   Participation Level:  Active  Participation Quality:  Appropriate, Attentive and Sharing  Affect:  Appropriate  Cognitive:  Alert, Appropriate and Oriented  Insight:  Engaged  Engagement in Therapy:  Engaged  Modes of Intervention:   Activity, Exploration  Ambrose Mantle, LCSW 12/11/2012, 1:19 PM

## 2012-12-11 NOTE — Progress Notes (Signed)
Patient ID: [name], female   DOB: 04-29-1976, 37 y.o.   MRN: [id number] D. The patient spent the evening in her room resting in bed. Minimal interaction with others. Denies a/v hallucinations. Denies suicidal ideation. A. Encouraged to attend evening group. Administered HS medication. Encouraged to increase fluids and given a pitcher of water. R. Did not attend evening group. Compliant with medication.

## 2012-12-11 NOTE — Tx Team (Signed)
  Interdisciplinary Treatment Plan Update   Date Reviewed:  12/11/2012  Time Reviewed:  2:29 PM  Progress in Treatment:   Attending groups: Yes Participating in groups: Yes Taking medication as prescribed: Yes  Tolerating medication: Yes Family/Significant other contact made: Yes  Patient understands diagnosis: Yes As evidenced by asking for help with psychosis abd paranoid thoughts Discussing patient identified problems/goals with staff: Yes Medical problems stabilized or resolved: Yes Denies suicidal/homicidal ideation: Yes  In tx team Patient has not harmed self or others: Yes  For review of initial/current patient goals, please see plan of care.  Estimated Length of Stay:  2-3 days  Reason for Continuation of Hospitalization: Medication stabilization  New Problems/Goals identified:  N/A  Discharge Plan or Barriers:   return home, follow up outpt  Additional Comments:  " I am doing a little better today, I came to the hospital because I was not taking my medication the right way."  [name] admits to overusing ritalin, and states that her experience prior to coming into the hospital was so scary that she no longer wishes to be prescribed Ritalin.     Attendees:  Signature: Thedore Mins, MD 12/11/2012 2:29 PM   Signature: Richelle Ito, LCSW 12/11/2012 2:29 PM  Signature: Verne Spurr, PA 12/11/2012 2:29 PM  Signature:  12/11/2012 2:29 PM  Signature: Waynetta Sandy, RN 12/11/2012 2:29 PM  Signature:  12/11/2012 2:29 PM  Signature:   12/11/2012 2:29 PM  Signature:    Signature:    Signature:    Signature:    Signature:    Signature:      Scribe for Treatment Team:   Richelle Ito, LCSW  12/11/2012 2:29 PM

## 2012-12-11 NOTE — Progress Notes (Addendum)
Patient ID: [name], female   DOB: 1976/02/10, 37 y.o.   MRN: [id number] D: "I'm OK."  A: Pt. Denies lethality and A/V/H's or other problems.  Pt. States she realizes she was acting "crazy" at the time of admission and doesn't know what made her feel that way.  Pt. states her brother is an EMT and "he thought maybe it was due to some medication I took."  Pt. is alert and oriented X4 and is appropriate with staff and peers.  Pt. Looked embarrassed as she described the situation surrounding her need for admission. Pt. attended groups.  R: Will continue to monitor for changes.

## 2012-12-11 NOTE — Progress Notes (Signed)
Patient ID: [name], female   DOB: [birth year], 37 y.o.   MRN: [id number] Psychoeducational Group Note  Date:  12/11/2012 Time:  1000am  Group Topic/Focus:  Making Healthy Choices:   The focus of this group is to help patients identify negative/unhealthy choices they were using prior to admission and identify positive/healthier coping strategies to replace them upon discharge.  Participation Level:  Active  Participation Quality:  Appropriate  Affect:  Appropriate  Cognitive:  Appropriate  Insight:  Supportive  Engagement in Group:  Supportive  Additional Comments:  Spiritual group   Valente David 12/11/2012,10:59 AM

## 2012-12-12 DIAGNOSIS — F3164 Bipolar disorder, current episode mixed, severe, with psychotic features: Secondary | ICD-10-CM

## 2012-12-12 MED ORDER — GABAPENTIN 300 MG PO CAPS
300.0000 mg | ORAL_CAPSULE | Freq: Two times a day (BID) | ORAL | Status: DC
  Administered 2012-12-12 – 2012-12-13 (×2): 300 mg via ORAL
  Filled 2012-12-12: qty 6
  Filled 2012-12-12 (×4): qty 1
  Filled 2012-12-12: qty 6

## 2012-12-12 MED ORDER — GABAPENTIN 600 MG PO TABS
300.0000 mg | ORAL_TABLET | Freq: Two times a day (BID) | ORAL | Status: DC
  Filled 2012-12-12 (×2): qty 0.5

## 2012-12-12 MED ORDER — BUPROPION HCL ER (XL) 300 MG PO TB24
300.0000 mg | ORAL_TABLET | Freq: Every day | ORAL | Status: DC
  Administered 2012-12-13: 300 mg via ORAL
  Filled 2012-12-12 (×2): qty 1
  Filled 2012-12-12: qty 3

## 2012-12-12 NOTE — Progress Notes (Signed)
D. Pt has been up and has been active in milieu and participating in various milieu activities. Pt reports feeling better but also states that she continues to have some anxiety but denies any SI this afternoon and did not endorse any depression today or hopeless feelings. Pt did speak about taking her medications like she is suppose to and to continue to take them even when she is feeling good. A. Support and encouragement provided, medication education completed. R. Pt verbalized understanding, will continue to monitor.

## 2012-12-12 NOTE — Progress Notes (Signed)
Patient ID: [name], female   DOB: 1976/04/12, 37 y.o.   MRN: [id number] D. The Patient was out of her room more this evening. Pleasant and interacting some in the milieu. Stated she really enjoyed the music therapy group she attended and that she learned how to utilize music to either soothe or energize herself. A. Met with patient. Encouraged to attend evening group. Administered HS medications. R. Denies a/v hallucinations. Denies suicidal ideation. Attended and actively participated in evening group.Compliant with medications.

## 2012-12-12 NOTE — Progress Notes (Signed)
Patient ID: [name], female   DOB: June 26, 1976, 37 y.o.   MRN: [id number] [facility] MD Progress Note  12/12/2012 11:11 AM ARRA CONNAUGHTON  MRN:  [id number] Subjective:   " I am doing a little better today, I came to the hospital because I was not taking my medication the right way."  Objective; Patient reports some body pain due to [facility], says she slept much better last night. She continues to report passive suicidal thoughts, anxiety and intermittent auditory hallucination. She also reports decreased racing thoughts,  mood swings, depressive symptoms and agitation. She is compliant with her medications and has not endorsed any adverse reactions to them. Diagnosis:   Axis I: Bipolar 1 disorder  Mixed episode with psychosis Axis II: Deferred Axis III:  Past Medical History  Diagnosis   . Asthma   . Fibromyalgia   . Headache(784.0)    Axis IV: other psychosocial or environmental problems and problems related to social environment  ADL's:  Intact  Sleep: fair  Appetite:  Fair  Suicidal Ideation:  Plan:  none Intent:    none Means:  None Homicidal Ideation:  Plan:  None AEB (as evidenced by):  Psychiatric Specialty Exam: Review of Systems  Constitutional: Positive for malaise/fatigue.  Gastrointestinal: Negative.   Genitourinary: Negative.   Musculoskeletal: Positive for myalgias.  Skin: Negative.   Neurological: Negative.  Negative for dizziness, sensory change, speech change, focal weakness, seizures and loss of consciousness.  Endo/Heme/Allergies: Negative.   Psychiatric/Behavioral: Positive for depression and hallucinations.    Blood pressure 139/79, pulse 118, temperature 98.1 F (36.7 C), temperature source Oral, resp. rate 20, height 5' 5.5" (1.664 m), weight 125.335 kg (276 lb 5 oz), last menstrual period 11/24/2012.Body mass index is 45.27 kg/(m^2).  General Appearance: Casual  Eye Contact::  Fair  Speech:  Normal Rate  Volume:  Decreased  Mood:  Anxious,  Depressed   Affect:  Depressed and Restricted  Thought Process:  Circumstantial  Orientation:  Full (Time, Place, and Person)  Thought Content:  Rumination  Suicidal Thoughts:  Yes.  without intent/plan  Homicidal Thoughts:  No  Memory:  Alert and oriented x3  Judgement:  Impaired  Insight:  Shallow  Psychomotor Activity:  Decreased  Concentration:  Fair  Recall:  Fair  Akathisia:  No  Handed:  Right  AIMS (if indicated):     Assets:  Housing Social Support  Sleep:  Number of Hours: 6.75   Current Medications: Current Facility-Administered Medications  Medication Dose Route Frequency Provider Last Rate Last Dose  . acetaminophen (TYLENOL) tablet 650 mg  650 mg Oral Q6H PRN Earney Navy, NP   650 mg at 12/10/12 0804  . alum & mag hydroxide-simeth (MAALOX/MYLANTA) 200-200-20 MG/5ML suspension 30 mL  30 mL Oral Q4H PRN Earney Navy, NP      . buPROPion (WELLBUTRIN XL) 24 hr tablet 150 mg  150 mg Oral Daily Cleotis Nipper, MD   150 mg at 12/12/12 0821  . ciprofloxacin (CIPRO) tablet 500 mg  500 mg Oral BID Sanjuana Kava, NP   500 mg at 12/12/12 6962  . LORazepam (ATIVAN) tablet 1 mg  1 mg Oral Q6H PRN Earney Navy, NP   1 mg at 12/11/12 0805   Or  . LORazepam (ATIVAN) injection 1 mg  1 mg Intramuscular Q6H PRN Earney Navy, NP      . magnesium hydroxide (MILK OF MAGNESIA) suspension 30 mL  30 mL Oral Daily PRN Julieanne Cotton C  [place], NP      . risperiDONE (RISPERDAL) tablet 2 mg  2 mg Oral QHS Cleotis Nipper, MD   2 mg at 12/11/12 2108    Lab Results: No results found for this or any previous visit (from the past 48 hour(s)).  Physical Findings: AIMS: Facial and Oral Movements Muscles of Facial Expression: None, normal Lips and Perioral Area: None, normal Jaw: None, normal Tongue: None, normal,Extremity Movements Upper (arms, wrists, hands, fingers): None, normal Lower (legs, knees, ankles, toes): None, normal,  , Overall Severity Severity of abnormal  movements (highest score from questions above): None, normal Incapacitation due to abnormal movements: None, normal Patient's awareness of abnormal movements (rate only patient's report): No Awareness, Dental Status Current problems with teeth and/or dentures?: No Does patient usually wear dentures?: Yes (top dentures)  CIWA:  CIWA-Ar Total: 0 COWS:     Treatment Plan Summary: Daily contact with patient to assess and evaluate symptoms and progress in treatment Medication management I will discontinue trazodone.  Increase Risperdal to milligram at bedtime  Plan:1. Admit for crisis management and stabilization. 2. Medication management to reduce current symptoms to base line and improve the     patient's overall level of functioning 3. Treat health problems as indicated. 4. Develop treatment plan to decrease risk of relapse upon discharge and the need for     readmission. 5. Psycho-social education regarding relapse prevention and self care. 6. Health care follow up as needed for medical problems. 7. Restart home medications where appropriate. 8. Add Neurontin 300mg  po BID to address  pain and agitation 9  ELOS 3-5days  Medical Decision Making Problem Points:  Established problem, stable/improving (1), Established problem, worsening (2), New problem, with additional work-up planned (4), Review of last therapy session (1) and Review of psycho-social stressors (1) Data Points:  Review or order clinical lab tests (1) Review of medication regiment & side effects (2) Review of new medications or change in dosage (2)  I certify that inpatient services furnished can reasonably be expected to improve the patient's condition.   Tahj Njoku,MD 12/12/2012, 11:11 AM

## 2012-12-12 NOTE — [facility] Group Notes (Signed)
[facility] LCSW Aftercare Discharge Planning Group Note   12/12/2012 8:12 AM  Participation Quality:  Engaged  Mood/Affect:  Appropriate  Depression Rating:  0  Anxiety Rating:  0  Thoughts of Suicide:  No Will you contract for safety?   NA  Current AVH:  No  Plan for Discharge/Comments:  [name] states it was her brother's idea for her to come in.  He stays with her, along with her girlfriend and 36 YO daughter. He was concerned about her paranoia and and AVHV.  She denies all symptoms today.  Lives in [place], has no psychiatrist.  Does not work; is financially supported by brother and girlfriend.    Transportation Means: family  Supports: family  [name], [name]

## 2012-12-12 NOTE — Progress Notes (Signed)
Recreation Therapy Notes  Date: 06.02.2014  Time: 9:30am  Location: [address] Dayroom   Group Topic/Focus: Goal Setting   Participation Level:  Active   Participation Quality:  Appropriate, Sharing  Affect:  Euthymic   Cognitive:  Oriented   Additional Comments: Activity: Bucket List ; Explanation: Patients were asked to make a list of things they can do in the next year to keep themselves well. Meditation music was played in the background to enhance therapeutic environment.   Patient actively participated in group activity. Patient wrote down realistic and achievable goals for herself, for example: read my bible, exercise three times a week, go to the beach or carowinds and spend time with loved ones. Patient shared list with group. Patient discussed the importance of each goal to her wellness.   Marykay Lex Keirstan Iannello, LRT/CTRS  Jearl Klinefelter 12/12/2012 12:13 PM

## 2012-12-12 NOTE — Progress Notes (Signed)
D: Patient sitting in the dayroom on approach.  Patient states she ahd a great day.  Patient states her family visited today and she states it was a successful visit.  Patient denies SI/HI and denies AVH. A: Staff to monitor Q 15 mins for safety.  Encouragement and support offered.  Scheduled medications administered per orders. R: Patient remains safe on the unit.  Patient attended group tonight.  Patient calm, cooperative and appears bright.  Patient visible on the unit and interacting with peers.  Patient taking administered medications.

## 2012-12-13 MED ORDER — GABAPENTIN 300 MG PO CAPS: 300.0000 mg | ORAL_CAPSULE | Freq: Two times a day (BID) | ORAL | Status: DC

## 2012-12-13 MED ORDER — BUPROPION HCL ER (XL) 300 MG PO TB24: 300.0000 mg | ORAL_TABLET | Freq: Every day | ORAL | Status: DC

## 2012-12-13 MED ORDER — RISPERIDONE 2 MG PO TABS: 2.0000 mg | ORAL_TABLET | Freq: Every day | ORAL | Status: DC

## 2012-12-13 NOTE — Progress Notes (Signed)
Adult Psychoeducational Group Note  Date:  12/13/2012 Time:  10:34 AM  Group Topic/Focus:  Recovery Goals:   The focus of this group is to identify appropriate goals for recovery and establish a plan to achieve them.  Participation Level:  Active  Participation Quality:  Attentive  Affect:  Appropriate  Cognitive:  Alert  Insight: Appropriate  Engagement in Group:  Engaged  Modes of Intervention:  Discussion, Education and Support  Additional Comments:  Lakynn was active in group. She talked about learning to say no and choosing to stay away from certain people in order to have a successful recovery.   Nichola Sizer 12/13/2012, 10:34 AM

## 2012-12-13 NOTE — [facility] Suicide Risk Assessment (Signed)
[facility] INPATIENT:  Family/Significant Other Suicide Prevention Education  Suicide Prevention Education:  Education Completed; No one has been identified by the patient as the family member/significant other with whom the patient will be residing, and identified as the person(s) who will aid the patient in the event of a mental health crisis (suicidal ideations/suicide attempt).  With written consent from the patient, the family member/significant other has been provided the following suicide prevention education, prior to the and/or following the discharge of the patient.  The suicide prevention education provided includes the following:  Suicide risk factors  Suicide prevention and interventions  National Suicide Hotline telephone number  [facility] assessment telephone number  [place] Emergency Assistance 911  [facility] and/or Residential Mobile Crisis Unit telephone number  Request made of family/significant other to:  Remove weapons (e.g., guns, rifles, knives), all items previously/currently identified as safety concern.    Remove drugs/medications (over-the-counter, prescriptions, illicit drugs), all items previously/currently identified as a safety concern.  The family member/significant other verbalizes understanding of the suicide prevention education information provided.  The family member/significant other agrees to remove the items of safety concern listed above.  Tychelle did not endorse SI at the time of admission, nor did she complain of SI during her stay here.  SPE not required.  Daryel Gerald B 12/13/2012, 2:13 PM

## 2012-12-13 NOTE — Tx Team (Signed)
  Interdisciplinary Treatment Plan Update   Date Reviewed:  12/13/2012  Time Reviewed:  2:28 PM  Progress in Treatment:   Attending groups: Yes Participating in groups: Yes Taking medication as prescribed: Yes  Tolerating medication: Yes Family/Significant other contact made: Yes  Patient understands diagnosis: Yes  Discussing patient identified problems/goals with staff: Yes Medical problems stabilized or resolved: Yes Denies suicidal/homicidal ideation: Yes  In tx team Patient has not harmed self or others: Yes  For review of initial/current patient goals, please see plan of care.  Estimated Length of Stay:  D/C today  Reason for Continuation of Hospitalization:   New Problems/Goals identified:  N/A  Discharge Plan or Barriers:   return home, follow up outpt  Additional Comments:  Attendees:  Signature: Thedore Mins, MD 12/13/2012 2:28 PM   Signature: Richelle Ito, LCSW 12/13/2012 2:28 PM  Signature: Verne Spurr, PA 12/13/2012 2:28 PM  Signature:  12/13/2012 2:28 PM  Signature: Liborio Nixon, RN 12/13/2012 2:28 PM  Signature:  12/13/2012 2:28 PM  Signature:   12/13/2012 2:28 PM  Signature:    Signature:    Signature:    Signature:    Signature:    Signature:      Scribe for Treatment Team:   Richelle Ito, LCSW  12/13/2012 2:28 PM

## 2012-12-13 NOTE — [facility] Suicide Risk Assessment (Signed)
Suicide Risk Assessment  Discharge Assessment     Demographic Factors:  Low socioeconomic status, Unemployed and female  Mental Status Per Nursing Assessment::   On Admission:  NA  Current Mental Status by Physician: patient denies suicidal ideation, intent or plan  Loss Factors: Financial problems/change in socioeconomic status  Historical Factors: Impulsivity  Risk Reduction Factors:   Living with another person, especially a relative, Positive social support and Positive therapeutic relationship  Continued Clinical Symptoms:  Alcohol/Substance Abuse/Dependencies  Cognitive Features That Contribute To Risk:  Closed-mindedness Polarized thinking    Suicide Risk:  Minimal: No identifiable suicidal ideation.  Patients presenting with no risk factors but with morbid ruminations; may be classified as minimal risk based on the severity of the depressive symptoms  Discharge Diagnoses:   AXIS I:  Bipolar 1 Disorder recent episode mixed  AXIS II:  Cluster B Traits AXIS III:   Past Medical History  Diagnosis Date  . Asthma   . Fibromyalgia   . Headache(784.0)    AXIS IV:  other psychosocial or environmental problems and problems related to social environment AXIS V:  61-70 mild symptoms  Plan Of Care/Follow-up recommendations:  Activity:  as tolerated Diet:  healthy Tests:  routine  Other:  patient to keep her after care appointment  Is patient on multiple antipsychotic therapies at discharge:  No   Has Patient had three or more failed trials of antipsychotic monotherapy by history:  No  Recommended Plan for Multiple Antipsychotic Therapies: N/A  Thedore Mins  ,MD  12/13/2012, 9:03 AM

## 2012-12-13 NOTE — Progress Notes (Signed)
Discharged note: Pt discharged to self. Pt denies SI/HI/AVH. Pt denies pain and show no s/s of distress. Pt received both written and verbal discharge instructions. Pt agreed to f/u appt and med regimen. Pt received all belongings from locker and room. Pt receptive to aftercare treatment and thanked the staff here at [facility] for their support. Pt safety left the facility.

## 2012-12-13 NOTE — Progress Notes (Signed)
[facility] Adult Case Management Discharge Plan :  Will you be returning to the same living situation after discharge: Yes,  home At discharge, do you have transportation home?:Yes,  family Do you have the ability to pay for your medications:Yes,  mental health  Release of information consent forms completed and in the chart;  Patient's signature needed at discharge.  Patient to Follow up at: Follow-up Information   Follow up with [facility] On 12/15/2012. (Thursday AM at 10:00 with Keya Wynes [facility])    Contact information:   [phone]  Zada Finders from the [employer]]  [phone]      Follow up with [facility] On 12/16/2012. (Go to the walk in clinic between 8 and 9AM for your hospital follow up appointment.  This is where you will see the psychiatrist for your medication)    Contact information:   [address]  [place]  [phone]      Patient denies SI/HI:   Yes,  yes    Safety Planning and Suicide Prevention discussed:  Yes,  yes  Daryel Gerald B 12/13/2012, 2:16 PM

## 2012-12-13 NOTE — Discharge Summary (Signed)
Physician Discharge Summary Note  Patient:  [name] is an 37 y.o., female MRN:  [id number] DOB:  09-24-1975 Patient phone:  [phone] (home)  Patient address:   [address] [place] [address],   Date of Admission:  12/09/2012 Date of Discharge: 12/13/2012  Reason for Admission: Suicide attempt  Discharge Diagnoses: Active Problems:   * No active hospital problems. *  Review of Systems  Constitutional: Negative.  Negative for fever, chills, weight loss, malaise/fatigue and diaphoresis.  HENT: Negative for congestion and sore throat.   Eyes: Negative for blurred vision, double vision and photophobia.  Respiratory: Negative for cough, shortness of breath and wheezing.   Cardiovascular: Negative for chest pain, palpitations and PND.  Gastrointestinal: Negative for heartburn, nausea, vomiting, abdominal pain, diarrhea and constipation.  Musculoskeletal: Negative for myalgias, joint pain and falls.  Neurological: Negative for dizziness, tingling, tremors, sensory change, speech change, focal weakness, seizures, loss of consciousness, weakness and headaches.  Endo/Heme/Allergies: Negative for polydipsia. Does not bruise/bleed easily.  Psychiatric/Behavioral: Negative for depression, suicidal ideas, hallucinations, memory loss and substance abuse. The patient is not nervous/anxious and does not have insomnia.   Discharge Diagnoses:  AXIS I: Bipolar 1 Disorder recent episode mixed  AXIS II: Cluster B Traits  AXIS III:  Past Medical History   Diagnosis  Date   .  Asthma    .  Fibromyalgia    .  Headache(784.0)     AXIS IV: other psychosocial or environmental problems and problems related to social environment  AXIS V: 61-70 mild symptoms  Level of Care:  OP  Hospital Course:  Mireille was admitted after presenting to the [facility] by EMS which was called due to her bizarre behavior. The patient began screaming and yelling that someone had taken her baby and had tried to shoot her.  She was a poor historian and most of the history was provided by her brother.  She was given medical clearance and transferred to [facility] for stabilization and treatment. Her labs were unremarkable with the exception of mild hypokalemia and her UDS was positive for opiates and benzodiazepines.      Upon admission to the unit, Anjeanette was evaluated and noted that she had been having increasing depression, increased anxiety with auditory hallucinations. She admitted that she was having difficulty with her partner who lives with her, and that she had taken an overdose of her medications including Ritalin and Klonopin in a suicide attempt.      Dua was given medication for her symptoms as noted and encouraged to participate in the unit programming. Initially Torunn was isolative keeping to her room, stating she could not remember the events leading to her admission. She was guarded and forwarded little information to the staff early on other than poor sleep. Her medications were adjusted and she did endorse better sleep and was able to think clearer. She began to attend groups on the unit and felt that she had learned to use music as well as other methods to help her cope with stress.      By her discharge Rosalynd had been visited by her family and felt supported and cared for. She denied AVH, and denied SI/HI. She was alert and oriented and in much improved condition than upon arrival. She was felt to be stable for discharge and agreed to follow up as noted below.  Consults:  None  Significant Diagnostic Studies:  None  Discharge Vitals:   Blood pressure 136/97, pulse 114, temperature 98  F (36.7 C), temperature source Oral, resp. rate 20, height 5' 5.5" (1.664 m), weight 125.335 kg (276 lb 5 oz), last menstrual period 11/24/2012. Body mass index is 45.27 kg/(m^2). Lab Results:   No results found for this or any previous visit (from the past 72 hour(s)).  Physical Findings: AIMS: Facial and Oral  Movements Muscles of Facial Expression: None, normal Lips and Perioral Area: None, normal Jaw: None, normal Tongue: None, normal,Extremity Movements Upper (arms, wrists, hands, fingers): None, normal Lower (legs, knees, ankles, toes): None, normal,  , Overall Severity Severity of abnormal movements (highest score from questions above): None, normal Incapacitation due to abnormal movements: None, normal Patient's awareness of abnormal movements (rate only patient's report): No Awareness, Dental Status Current problems with teeth and/or dentures?: No Does patient usually wear dentures?: Yes (top dentures)  CIWA:  CIWA-Ar Total: 0 COWS:     Psychiatric Specialty Exam: See Psychiatric Specialty Exam and Suicide Risk Assessment completed by Attending Physician prior to discharge.  Discharge destination:  Home  Is patient on multiple antipsychotic therapies at discharge:  No   Has Patient had three or more failed trials of antipsychotic monotherapy by history:  No  Recommended Plan for Multiple Antipsychotic Therapies: No   Discharge Orders   Future Orders Complete By Expires     Diet - low sodium heart healthy  As directed     Discharge instructions  As directed     Comments:      Take all of your medications as directed. Be sure to keep all of your follow up appointments.  If you are unable to keep your follow up appointment, call your Doctor's office to let them know, and reschedule.  Make sure that you have enough medication to last until your appointment. Be sure to get plenty of rest. Going to bed at the same time each night will help. Try to avoid sleeping during the day.  Increase your activity as tolerated. Regular exercise will help you to sleep better and improve your mental health. Eating a heart healthy diet is recommended. Try to avoid salty or fried foods. Be sure to avoid all alcohol and illegal drugs.    Increase activity slowly  As directed         Medication List     STOP taking these medications       carisoprodol 350 MG tablet  Commonly known as:  SOMA     HYDROcodone-acetaminophen 5-325 MG per tablet  Commonly known as:  NORCO/VICODIN     methylphenidate 20 MG tablet  Commonly known as:  RITALIN     omeprazole 40 MG capsule  Commonly known as:  PRILOSEC      TAKE these medications     Indication   buPROPion 300 MG 24 hr tablet  Commonly known as:  WELLBUTRIN XL  Take 1 tablet (300 mg total) by mouth daily. For depression and anxiety.   Indication:  Major Depressive Disorder     gabapentin 300 MG capsule  Commonly known as:  NEURONTIN  Take 1 capsule (300 mg total) by mouth 2 (two) times daily. For anxiety and neuropathic pain.   Indication:  Agitation, Neuropathic Pain     risperiDONE 2 MG tablet  Commonly known as:  RISPERDAL  Take 1 tablet (2 mg total) by mouth at bedtime. For anxiety and psychosis.   Indication:  Manic-Depression           Follow-up Information   Follow up with [facility] On  12/15/2012. (Thursday AM at 10:00)       Follow-up recommendations:   Activities: Resume activity as tolerated. Diet: Heart healthy low sodium diet Tests: Follow up testing will be determined by your out patient provider.  Comments:    Total Discharge Time:  Greater than 30 minutes.  Signed: Taino Maertens 12/13/2012, 9:22 AM

## 2012-12-15 NOTE — Progress Notes (Addendum)
Patient Discharge Instructions:  After Visit Summary (AVS):   Faxed to:  12/15/12 Psychiatric Admission Assessment Note:   Faxed to:  12/15/12 Suicide Risk Assessment - Discharge Assessment:   Faxed to:  12/15/12 Faxed/Sent to the Next Level Care provider:  12/15/12 Faxed to [facility] @ [phone] Faxed to [facility] @ [phone]  Jerelene Redden, 12/15/2012, 2:40 PM

## 2012-12-20 NOTE — Discharge Summary (Signed)
Seen and agreed. Kendal Raffo, MD 

## 2013-07-13 DIAGNOSIS — Z8701 Personal history of pneumonia (recurrent): Secondary | ICD-10-CM

## 2013-07-13 DIAGNOSIS — J189 Pneumonia, unspecified organism: Secondary | ICD-10-CM

## 2013-07-13 HISTORY — DX: Pneumonia, unspecified organism: J18.9

## 2013-07-13 HISTORY — DX: Personal history of pneumonia (recurrent): Z87.01

## 2013-08-14 ENCOUNTER — Emergency Department (HOSPITAL_COMMUNITY)
Admission: EM | Admit: 2013-08-14 | Discharge: 2013-08-14 | Disposition: A | Discharge status: Home / Self Care | Payer: Medicaid Other | Attending: Emergency Medicine | Admitting: Emergency Medicine

## 2013-08-14 ENCOUNTER — Encounter (HOSPITAL_COMMUNITY): Payer: Self-pay | Admitting: Emergency Medicine

## 2013-08-14 DIAGNOSIS — Z23 Encounter for immunization: Secondary | ICD-10-CM | POA: Insufficient documentation

## 2013-08-14 DIAGNOSIS — J45909 Unspecified asthma, uncomplicated: Secondary | ICD-10-CM | POA: Insufficient documentation

## 2013-08-14 DIAGNOSIS — Z91040 Latex allergy status: Secondary | ICD-10-CM | POA: Insufficient documentation

## 2013-08-14 DIAGNOSIS — Y929 Unspecified place or not applicable: Secondary | ICD-10-CM | POA: Insufficient documentation

## 2013-08-14 DIAGNOSIS — F17200 Nicotine dependence, unspecified, uncomplicated: Secondary | ICD-10-CM | POA: Insufficient documentation

## 2013-08-14 DIAGNOSIS — W1809XA Striking against other object with subsequent fall, initial encounter: Secondary | ICD-10-CM | POA: Insufficient documentation

## 2013-08-14 DIAGNOSIS — IMO0002 Reserved for concepts with insufficient information to code with codable children: Secondary | ICD-10-CM | POA: Insufficient documentation

## 2013-08-14 DIAGNOSIS — S59909A Unspecified injury of unspecified elbow, initial encounter: Secondary | ICD-10-CM | POA: Insufficient documentation

## 2013-08-14 DIAGNOSIS — S76011A Strain of muscle, fascia and tendon of right hip, initial encounter: Secondary | ICD-10-CM

## 2013-08-14 DIAGNOSIS — IMO0001 Reserved for inherently not codable concepts without codable children: Secondary | ICD-10-CM | POA: Insufficient documentation

## 2013-08-14 DIAGNOSIS — S59919A Unspecified injury of unspecified forearm, initial encounter: Secondary | ICD-10-CM

## 2013-08-14 DIAGNOSIS — T07XXXA Unspecified multiple injuries, initial encounter: Secondary | ICD-10-CM | POA: Insufficient documentation

## 2013-08-14 DIAGNOSIS — S4980XA Other specified injuries of shoulder and upper arm, unspecified arm, initial encounter: Secondary | ICD-10-CM | POA: Insufficient documentation

## 2013-08-14 DIAGNOSIS — Z880 Allergy status to penicillin: Secondary | ICD-10-CM | POA: Insufficient documentation

## 2013-08-14 DIAGNOSIS — S46909A Unspecified injury of unspecified muscle, fascia and tendon at shoulder and upper arm level, unspecified arm, initial encounter: Secondary | ICD-10-CM | POA: Insufficient documentation

## 2013-08-14 DIAGNOSIS — W010XXA Fall on same level from slipping, tripping and stumbling without subsequent striking against object, initial encounter: Secondary | ICD-10-CM | POA: Insufficient documentation

## 2013-08-14 DIAGNOSIS — Y9389 Activity, other specified: Secondary | ICD-10-CM | POA: Insufficient documentation

## 2013-08-14 DIAGNOSIS — S6990XA Unspecified injury of unspecified wrist, hand and finger(s), initial encounter: Secondary | ICD-10-CM | POA: Insufficient documentation

## 2013-08-14 MED ORDER — TETANUS-DIPHTH-ACELL PERTUSSIS 5-2.5-18.5 LF-MCG/0.5 IM SUSP
0.5000 mL | Freq: Once | INTRAMUSCULAR | Status: AC
  Administered 2013-08-14: 0.5 mL via INTRAMUSCULAR
  Filled 2013-08-14: qty 0.5

## 2013-08-14 MED ORDER — DICLOFENAC SODIUM 75 MG PO TBEC: 75.0000 mg | DELAYED_RELEASE_TABLET | Freq: Two times a day (BID) | ORAL | Status: DC

## 2013-08-14 MED ORDER — DIAZEPAM 5 MG PO TABS: 5.0000 mg | ORAL_TABLET | Freq: Three times a day (TID) | ORAL | Status: DC

## 2013-08-14 NOTE — ED Notes (Signed)
Pt alert, NAD color good.  Pain from fall on Saturday.   Pain rt arm and rt leg

## 2013-08-14 NOTE — ED Notes (Signed)
Per patient c/o right arm and right leg. Per patient tripped over rug chasing dog Saturday. Per patient piece of wood hit leg. Patient reports falling again last night and landing on back. Denies hitting head or LOC. Patient also c/o cough and sore throat.

## 2013-08-14 NOTE — Discharge Instructions (Signed)
Muscle Strain YOUR EXAM SUGGEST MULTIPLE AREAS OF BRUISING, AND MUSCLE STRAIN. PLEASE REST YOUR ARM , HIP, AND LEG AS MUCH AS POSSIBLE. PLEASE SEE DR Hilda Lias FOR FOLLOW UP AND MANAGEMENT IF NOT IMPROVING.  VALIUM MAY CAUSE DROWSINESS, USE WITH CAUTION. PLEASE MAKE A NOT IN YOUR PERSONAL RECORDS THAT YOUR TETANUS WAS UPDATED TODAY.                                                                                                                 A muscle strain (pulled muscle) happens when a muscle is stretched beyond normal length. It happens when a sudden, violent force stretches your muscle too far. Usually, a few of the fibers in your muscle are torn. Muscle strain is common in athletes. Recovery usually takes 1 2 weeks. Complete healing takes 5 6 weeks.  HOME CARE   Follow the PRICE method of treatment to help your injury get better. Do this the first 2 3 days after the injury:  Protect. Protect the muscle to keep it from getting injured again.  Rest. Limit your activity and rest the injured body part.  Ice. Put ice in a plastic bag. Place a towel between your skin and the bag. Then, apply the ice and leave it on from 15 20 minutes each hour. After the third day, switch to moist heat packs.  Compression. Use a splint or elastic bandage on the injured area for comfort. Do not put it on too tightly.  Elevate. Keep the injured body part above the level of your heart.  Only take medicine as told by your doctor.  Warm up before doing exercise to prevent future muscle strains. GET HELP IF:   You have more pain or puffiness (swelling) in the injured area.  You feel numbness, tingling, or notice a loss of strength in the injured area. MAKE SURE YOU:   Understand these instructions.  Will watch your condition.  Will get help right away if you are not doing well or get worse. Document Released: 04/07/2008 Document Revised: 04/19/2013 Document Reviewed: 01/26/2013 [facility] Patient Information  2014 [place], [place].  Contusion A contusion is a deep bruise. Contusions happen when an injury causes bleeding under the skin. Signs of bruising include pain, puffiness (swelling), and discolored skin. The contusion may turn blue, purple, or yellow. HOME CARE   Put ice on the injured area.  Put ice in a plastic bag.  Place a towel between your skin and the bag.  Leave the ice on for 15-20 minutes, 03-04 times a day.  Only take medicine as told by your doctor.  Rest the injured area.  If possible, raise (elevate) the injured area to lessen puffiness. GET HELP RIGHT AWAY IF:   You have more bruising or puffiness.  You have pain that is getting worse.  Your puffiness or pain is not helped by medicine. MAKE SURE YOU:   Understand these instructions.  Will watch your condition.  Will get help right away if you are not doing well  or get worse. Document Released: 12/16/2007 Document Revised: 09/21/2011 Document Reviewed: 05/04/2011 [facility] Patient Information 2014 [employer].

## 2013-08-14 NOTE — ED Provider Notes (Signed)
CSN: [id number]     Arrival date & time 08/14/13  1707 History   First MD Initiated Contact with Patient 08/14/13 1826     Chief Complaint  Patient presents with  . Fall  . Arm Pain  . Leg Pain   (Consider location/radiation/quality/duration/timing/severity/associated sxs/prior Treatment) Patient is a 38 y.o. female presenting with fall, arm pain, and leg pain. The history is provided by the patient.  Fall The current episode started in the past 7 days. The problem occurs constantly. The problem has been gradually worsening. Associated symptoms include arthralgias. Pertinent negatives include no change in bowel habit, chills, coughing, diaphoresis, headaches, visual change or vomiting. Associated symptoms comments: Back pain, right arm and right leg pain..  Arm Pain Associated symptoms include arthralgias. Pertinent negatives include no change in bowel habit, chills, coughing, diaphoresis, headaches, visual change or vomiting.  Leg Pain   Past Medical History  Diagnosis Date  . Asthma   . Fibromyalgia   . NUUVOZDG(644.0Headache(784.0)    Past Surgical History  Procedure Laterality Date  . Cyst removal hand    . Esophageal dilation     Family History  Problem Relation Age of Onset  . Cancer Other    History  Substance Use Topics  . Smoking status: Current Every Day Smoker -- 1.50 packs/day for 10 years    Types: Cigarettes  . Smokeless tobacco: Never Used  . Alcohol Use: Yes     Comment: occasional   OB History   Grav Para Term Preterm Abortions TAB SAB Ect Mult Living   3 1 1  2  2   1      Review of Systems  Constitutional: Negative for chills and diaphoresis.  Respiratory: Negative for cough.   Gastrointestinal: Negative for vomiting and change in bowel habit.  Musculoskeletal: Positive for arthralgias.  Neurological: Negative for headaches.    Allergies  Latex; Sulfa antibiotics; and Penicillins  Home Medications   Current Outpatient Rx  Name  Route  Sig  Dispense   Refill  . buPROPion (WELLBUTRIN XL) 300 MG 24 hr tablet   Oral   Take 1 tablet (300 mg total) by mouth daily. For depression and anxiety.   30 tablet   0   . gabapentin (NEURONTIN) 300 MG capsule   Oral   Take 1 capsule (300 mg total) by mouth 2 (two) times daily. For anxiety and neuropathic pain.   60 capsule   0   . risperiDONE (RISPERDAL) 2 MG tablet   Oral   Take 1 tablet (2 mg total) by mouth at bedtime. For anxiety and psychosis.   30 tablet   0    BP 107/71  Pulse 87  Temp(Src) 98 F (36.7 C) (Oral)  Resp 18  Ht 5\' 7"  (1.702 m)  Wt 360 lb (163.295 kg)  BMI 56.37 kg/m2  SpO2 100%  LMP 07/15/2013 Physical Exam  Nursing note and vitals reviewed. Constitutional: She is oriented to person, place, and time. She appears well-developed and well-nourished.  Non-toxic appearance.  HENT:  Head: Normocephalic.  Right Ear: Tympanic membrane and external ear normal.  Left Ear: Tympanic membrane and external ear normal.  Eyes: EOM and lids are normal. Pupils are equal, round, and reactive to light.  Neck: Normal range of motion. Neck supple. Carotid bruit is not present.  Cardiovascular: Normal rate, regular rhythm, normal heart sounds, intact distal pulses and normal pulses.   Pulmonary/Chest: Breath sounds normal. No respiratory distress.  No chest wall tenderness, no rib  area tenderness.  Abdominal: Soft. Bowel sounds are normal. There is no tenderness. There is no guarding.  Musculoskeletal: Normal range of motion.  There is some soreness with range of motion of the right hip, but no evidence of dislocation or deformity. There is no shortening of the right lower extremity. There is full range of motion of the right knee. There is abrasions noted of the anterior right lower leg. There is soreness with range of motion of the right ankle, but no swelling or deformity. There is full range of motion of the toes of the right foot.  There is also some soreness of the right shoulder  and arm. No evidence for dislocation. There is some anterior shoulder discomfort appreciated.  Lymphadenopathy:       Head (right side): No submandibular adenopathy present.       Head (left side): No submandibular adenopathy present.    She has no cervical adenopathy.  Neurological: She is alert and oriented to person, place, and time. She has normal strength. No cranial nerve deficit or sensory deficit.  Skin: Skin is warm and dry.  Psychiatric: She has a normal mood and affect. Her speech is normal.    ED Course  Procedures (including critical care time) Labs Review Labs Reviewed - No data to display Imaging Review No results found.  EKG Interpretation   None       MDM  No diagnosis found. **I have reviewed nursing notes, vital signs, and all appropriate lab and imaging results for this patient.*  Patient's tetanus is was updated. Prescription for diazepam and diclofenac given to the patient. Patient is to followup with orthopedics if not improving.  Kathie Dike, PA-C 08/15/13 1642

## 2013-08-16 NOTE — ED Provider Notes (Signed)
Medical screening examination/treatment/procedure(s) were performed by non-physician practitioner and as supervising physician I was immediately available for consultation/collaboration.  EKG Interpretation   None         Conn Trombetta L Bibi Economos, MD 08/16/13 1528 

## 2014-05-14 ENCOUNTER — Encounter (HOSPITAL_COMMUNITY): Payer: Self-pay | Admitting: Emergency Medicine

## 2014-12-03 DIAGNOSIS — Z0289 Encounter for other administrative examinations: Secondary | ICD-10-CM

## 2014-12-20 ENCOUNTER — Encounter: Payer: Self-pay | Admitting: Neurology

## 2014-12-20 ENCOUNTER — Ambulatory Visit (INDEPENDENT_AMBULATORY_CARE_PROVIDER_SITE_OTHER): Payer: Medicaid Other | Admitting: Neurology

## 2014-12-20 ENCOUNTER — Ambulatory Visit (INDEPENDENT_AMBULATORY_CARE_PROVIDER_SITE_OTHER): Payer: Self-pay | Admitting: Neurology

## 2014-12-20 DIAGNOSIS — R202 Paresthesia of skin: Secondary | ICD-10-CM | POA: Diagnosis not present

## 2014-12-20 DIAGNOSIS — M5010 Cervical disc disorder with radiculopathy, unspecified cervical region: Secondary | ICD-10-CM

## 2014-12-20 NOTE — Progress Notes (Signed)
Please refer to EMG and nerve conduction study procedure note. 

## 2014-12-20 NOTE — Procedures (Signed)
     HISTORY:  [name] is a 39 year old patient with a ten-year history of numbness in the hands, predominantly in the fourth and fifth fingers, worse on the left. The patient has some neck and shoulder discomfort as well. She is being evaluated for the chronic paresthesias.  NERVE CONDUCTION STUDIES:  Nerve conduction studies were performed on both upper extremities. The distal motor latencies and motor amplitudes for the median and ulnar nerves were within normal limits. The F wave latencies and nerve conduction velocities for these nerves were also normal. The sensory latencies for the median and ulnar nerves were normal.   EMG STUDIES:  EMG study was performed on the left upper extremity:  The first dorsal interosseous muscle reveals 2 to 4 K units with full recruitment. 2+ positive waves were noted. The abductor pollicis brevis muscle reveals 2 to 4 K units with full recruitment. 1+ positive waves were noted. The extensor indicis proprius muscle reveals 1 to 3 K units with full recruitment. No fibrillations or positive waves were noted. The pronator teres muscle reveals 2 to 3 K units with full recruitment. No fibrillations or positive waves were noted. The biceps muscle reveals 1 to 2 K units with full recruitment. No fibrillations or positive waves were noted. The triceps muscle reveals 2 to 4 K units with full recruitment. No fibrillations or positive waves were noted. The anterior deltoid muscle reveals 2 to 3 K units with full recruitment. No fibrillations or positive waves were noted. The cervical paraspinal muscles were tested at 2 levels. No abnormalities of insertional activity were seen at the lower level tested. 2+ positive waves were seen at the upper level. There was good relaxation.  EMG study was performed on the right upper extremity:  The first dorsal interosseous muscle reveals 2 to 4 K units with full recruitment. No fibrillations or positive waves were  noted. The abductor pollicis brevis muscle reveals 2 to 4 K units with full recruitment. 1+ positive waves were noted. The extensor indicis proprius muscle reveals 1 to 3 K units with full recruitment. No fibrillations or positive waves were noted. The pronator teres muscle reveals 2 to 3 K units with full recruitment. No fibrillations or positive waves were noted. The biceps muscle reveals 1 to 2 K units with full recruitment. No fibrillations or positive waves were noted. The triceps muscle reveals 2 to 4 K units with full recruitment. No fibrillations or positive waves were noted. The anterior deltoid muscle reveals 2 to 3 K units with full recruitment. No fibrillations or positive waves were noted. The cervical paraspinal muscles were tested at 2 levels. No abnormalities of insertional activity were seen at the lower level tested. 2+ positive waves were seen at the upper level. There was good relaxation.   IMPRESSION:  Nerve conduction studies done on both upper extremities were unremarkable, no evidence of a neuropathy is seen. EMG evaluation of the left upper extremity shows mild acute denervation in a pattern most consistent with a low-grade C8 or T1 radiculopathy. Similar findings were noted on the right upper extremity. Clinical correlation is required.  Marlan Palau MD 12/20/2014 2:55 PM  [facility] [address] [place], [place] [address]  Phone [phone] Fax [phone]

## 2015-01-28 DIAGNOSIS — Z0289 Encounter for other administrative examinations: Secondary | ICD-10-CM

## 2015-06-07 ENCOUNTER — Encounter (HOSPITAL_COMMUNITY): Payer: Self-pay

## 2015-06-07 DIAGNOSIS — J45909 Unspecified asthma, uncomplicated: Secondary | ICD-10-CM | POA: Diagnosis not present

## 2015-06-07 DIAGNOSIS — Z79899 Other long term (current) drug therapy: Secondary | ICD-10-CM | POA: Insufficient documentation

## 2015-06-07 DIAGNOSIS — M797 Fibromyalgia: Secondary | ICD-10-CM | POA: Diagnosis not present

## 2015-06-07 DIAGNOSIS — Z91040 Latex allergy status: Secondary | ICD-10-CM | POA: Insufficient documentation

## 2015-06-07 DIAGNOSIS — N390 Urinary tract infection, site not specified: Secondary | ICD-10-CM | POA: Diagnosis not present

## 2015-06-07 DIAGNOSIS — F17210 Nicotine dependence, cigarettes, uncomplicated: Secondary | ICD-10-CM | POA: Diagnosis not present

## 2015-06-07 DIAGNOSIS — Z880 Allergy status to penicillin: Secondary | ICD-10-CM | POA: Insufficient documentation

## 2015-06-07 DIAGNOSIS — R109 Unspecified abdominal pain: Secondary | ICD-10-CM | POA: Diagnosis present

## 2015-06-07 NOTE — ED Notes (Signed)
Pt states she awoke approx 3 am and has been hurting in her right flank and has not been able to get comfortable since.  Pt states she is treating or yeast infection, and also having some burning with urination

## 2015-06-08 ENCOUNTER — Emergency Department (HOSPITAL_COMMUNITY): Payer: Medicaid Other

## 2015-06-08 ENCOUNTER — Emergency Department (HOSPITAL_COMMUNITY)
Admission: EM | Admit: 2015-06-08 | Discharge: 2015-06-08 | Disposition: A | Discharge status: Home / Self Care | Payer: Medicaid Other | Attending: Emergency Medicine | Admitting: Emergency Medicine

## 2015-06-08 DIAGNOSIS — N390 Urinary tract infection, site not specified: Secondary | ICD-10-CM

## 2015-06-08 DIAGNOSIS — R109 Unspecified abdominal pain: Secondary | ICD-10-CM

## 2015-06-08 LAB — COMPREHENSIVE METABOLIC PANEL
ALK PHOS: 95 U/L (ref 38–126)
ALT: 16 U/L (ref 14–54)
AST: 17 U/L (ref 15–41)
Albumin: 3.8 g/dL (ref 3.5–5.0)
Anion gap: 9 (ref 5–15)
BILIRUBIN TOTAL: 0.4 mg/dL (ref 0.3–1.2)
BUN: 13 mg/dL (ref 6–20)
CALCIUM: 9 mg/dL (ref 8.9–10.3)
CHLORIDE: 108 mmol/L (ref 101–111)
CO2: 23 mmol/L (ref 22–32)
CREATININE: 1.09 mg/dL — AB (ref 0.44–1.00)
Glucose, Bld: 117 mg/dL — ABNORMAL HIGH (ref 65–99)
Potassium: 3.8 mmol/L (ref 3.5–5.1)
Sodium: 140 mmol/L (ref 135–145)
TOTAL PROTEIN: 6.6 g/dL (ref 6.5–8.1)

## 2015-06-08 LAB — URINALYSIS, ROUTINE W REFLEX MICROSCOPIC
BILIRUBIN URINE: NEGATIVE
Glucose, UA: NEGATIVE mg/dL
Ketones, ur: NEGATIVE mg/dL
NITRITE: NEGATIVE
PH: 5.5 (ref 5.0–8.0)
SPECIFIC GRAVITY, URINE: 1.025 (ref 1.005–1.030)

## 2015-06-08 LAB — URINE MICROSCOPIC-ADD ON

## 2015-06-08 LAB — CBC WITH DIFFERENTIAL/PLATELET
Basophils Absolute: 0 10*3/uL (ref 0.0–0.1)
Basophils Relative: 0 %
EOS ABS: 0.5 10*3/uL (ref 0.0–0.7)
Eosinophils Relative: 6 %
HCT: 40.1 % (ref 36.0–46.0)
HEMOGLOBIN: 13.7 g/dL (ref 12.0–15.0)
LYMPHS ABS: 2.7 10*3/uL (ref 0.7–4.0)
Lymphocytes Relative: 35 %
MCH: 31.8 pg (ref 26.0–34.0)
MCHC: 34.2 g/dL (ref 30.0–36.0)
MCV: 93 fL (ref 78.0–100.0)
MONOS PCT: 7 %
Monocytes Absolute: 0.6 10*3/uL (ref 0.1–1.0)
NEUTROS PCT: 52 %
Neutro Abs: 4 10*3/uL (ref 1.7–7.7)
Platelets: 217 10*3/uL (ref 150–400)
RBC: 4.31 MIL/uL (ref 3.87–5.11)
RDW: 13.7 % (ref 11.5–15.5)
WBC: 7.8 10*3/uL (ref 4.0–10.5)

## 2015-06-08 LAB — LIPASE, BLOOD: LIPASE: 35 U/L (ref 11–51)

## 2015-06-08 MED ORDER — MORPHINE SULFATE (PF) 4 MG/ML IV SOLN
4.0000 mg | Freq: Once | INTRAVENOUS | Status: AC
  Administered 2015-06-08: 4 mg via INTRAVENOUS
  Filled 2015-06-08: qty 1

## 2015-06-08 MED ORDER — ONDANSETRON HCL 4 MG/2ML IJ SOLN
4.0000 mg | Freq: Once | INTRAMUSCULAR | Status: AC
  Administered 2015-06-08: 4 mg via INTRAVENOUS
  Filled 2015-06-08: qty 2

## 2015-06-08 MED ORDER — DEXTROSE 5 % IV SOLN
1.0000 g | Freq: Once | INTRAVENOUS | Status: AC
  Administered 2015-06-08: 1 g via INTRAVENOUS
  Filled 2015-06-08: qty 10

## 2015-06-08 MED ORDER — ONDANSETRON HCL 4 MG PO TABS: 4.0000 mg | ORAL_TABLET | Freq: Four times a day (QID) | ORAL | Status: DC | PRN

## 2015-06-08 MED ORDER — OXYCODONE-ACETAMINOPHEN 5-325 MG PO TABS: 1.0000 | ORAL_TABLET | ORAL | Status: DC | PRN

## 2015-06-08 MED ORDER — SODIUM CHLORIDE 0.9 % IV SOLN
1000.0000 mL | INTRAVENOUS | Status: DC
  Administered 2015-06-08: 1000 mL via INTRAVENOUS

## 2015-06-08 MED ORDER — SODIUM CHLORIDE 0.9 % IV SOLN
1000.0000 mL | Freq: Once | INTRAVENOUS | Status: AC
  Administered 2015-06-08: 1000 mL via INTRAVENOUS

## 2015-06-08 MED ORDER — CEPHALEXIN 500 MG PO CAPS: 500.0000 mg | ORAL_CAPSULE | Freq: Four times a day (QID) | ORAL | Status: DC

## 2015-06-08 NOTE — ED Provider Notes (Signed)
CSN: [id number]     Arrival date & time 06/07/15  2331 History  By signing my name below, I, Doreatha Martin, attest that this documentation has been prepared under the direction and in the presence of Dione Booze, MD. Electronically Signed: Doreatha Martin, ED Scribe. 06/08/2015. 12:21 AM.    Chief Complaint  Patient presents with  . Flank Pain   The history is provided by the patient. No language interpreter was used.    HPI Comments: [name] is a 39 y.o. female who presents to the Emergency Department complaining of moderate, constant, dull, 9/10 right flank pain onset this morning at 3AM. Pt states associated dysuria, frequency and vaginal itching for the past week. Pt states no exacerbating factors of flank pain. She notes moderate relief with pressure application. Pt states that she thought she had a yeast infection and has been treating her urinary symptoms and vaginal itching with Monistat with moderate relief. She has taken 600 mg Ibuprofen and a muscle relaxer with mild to moderate relief of flank pain. PCP is Dr. Sudie Bailey. She denies emesis.   Past Medical History  Diagnosis Date  . Asthma   . Fibromyalgia   . [id number](478.2)    Past Surgical History  Procedure Laterality Date  . Cyst removal hand    . Esophageal dilation     Family History  Problem Relation Age of Onset  . Cancer Other    Social History  Substance Use Topics  . Smoking status: Current Every Day Smoker -- 1.50 packs/day for 10 years    Types: Cigarettes  . Smokeless tobacco: Never Used  . Alcohol Use: Yes     Comment: occasional   OB History    Gravida Para Term Preterm AB TAB SAB Ectopic Multiple Living   Review of Systems  Gastrointestinal: Positive for nausea. Negative for vomiting.  Genitourinary: Positive for dysuria, frequency and flank pain.  All other systems reviewed and are negative.  Allergies  Latex; Sulfa antibiotics; and Penicillins  Home Medications    Prior to Admission medications   Medication Sig Start Date End Date Taking? Authorizing Provider  albuterol (PROVENTIL HFA;VENTOLIN HFA) 108 (90 BASE) MCG/ACT inhaler Inhale 2 puffs into the lungs every 6 (six) hours as needed for wheezing or shortness of breath.   Yes Historical Provider, MD  albuterol-ipratropium (COMBIVENT) 18-103 MCG/ACT inhaler Inhale into the lungs every 4 (four) hours.   Yes Historical Provider, MD  hydrochlorothiazide (HYDRODIURIL) 25 MG tablet Take 25 mg by mouth daily.   Yes Historical Provider, MD  methocarbamol (ROBAXIN) 750 MG tablet Take 750 mg by mouth 3 (three) times daily.   Yes Historical Provider, MD  omeprazole (PRILOSEC) 40 MG capsule Take 40 mg by mouth daily.   Yes Historical Provider, MD  QUEtiapine (SEROQUEL) 25 MG tablet Take 25 mg by mouth at bedtime.   Yes Historical Provider, MD  topiramate (TOPAMAX) 50 MG tablet Take 50 mg by mouth 2 (two) times daily.   Yes Historical Provider, MD  buPROPion (WELLBUTRIN XL) 300 MG 24 hr tablet Take 1 tablet (300 mg total) by mouth daily. For depression and anxiety. 12/13/12   Tamala Julian, PA-C  diazepam (VALIUM) 5 MG tablet Take 1 tablet (5 mg total) by mouth 3 (three) times daily. 08/14/13   Ivery Quale, PA-C  diclofenac (VOLTAREN) 75 MG EC tablet Take 1 tablet (75 mg total) by mouth 2 (two) times  daily. 08/14/13   Ivery Quale, PA-C  gabapentin (NEURONTIN) 300 MG capsule Take 1 capsule (300 mg total) by mouth 2 (two) times daily. For anxiety and neuropathic pain. 12/13/12   Tamala Julian, PA-C  risperiDONE (RISPERDAL) 2 MG tablet Take 1 tablet (2 mg total) by mouth at bedtime. For anxiety and psychosis. 12/13/12   Tamala Julian, PA-C   BP 138/89 mmHg  Pulse 81  Temp(Src) 98 F (36.7 C) (Oral)  Resp 18  Ht  (1.676 m)  Wt 304 lb (137.893 kg)  BMI 49.09 kg/m2  SpO2 100%  LMP 05/27/2015 Physical Exam  Constitutional: She is oriented to person, place, and time. She appears well-developed and  well-nourished.  HENT:  Head: Normocephalic and atraumatic.  Eyes: Conjunctivae and EOM are normal. Pupils are equal, round, and reactive to light.  Neck: Normal range of motion. Neck supple. No JVD present.  Cardiovascular: Normal rate, regular rhythm and normal heart sounds.   No murmur heard. Pulmonary/Chest: Effort normal and breath sounds normal. She has no wheezes. She has no rales. She exhibits no tenderness.  Lungs CTA bilaterally.   Abdominal: Soft. She exhibits no distension and no mass. There is tenderness.  Moderate right CVA tenderness. Bowel sounds decreased.   Musculoskeletal: Normal range of motion. She exhibits no edema.  Lymphadenopathy:    She has no cervical adenopathy.  Neurological: She is alert and oriented to person, place, and time. No cranial nerve deficit. She exhibits normal muscle tone. Coordination normal.  Skin: Skin is warm and dry. No rash noted.  Psychiatric: She has a normal mood and affect. Her behavior is normal. Judgment and thought content normal.  Nursing note and vitals reviewed.  ED Course  Procedures (including critical care time) DIAGNOSTIC STUDIES: Oxygen Saturation is 100% on RA, normal by my interpretation.    COORDINATION OF CARE: 12:16 AM Discussed treatment plan with pt at bedside and pt agreed to plan.   Labs Review Results for orders placed or performed during the hospital encounter of 06/08/15  Urinalysis, Routine w reflex microscopic  Result Value Ref Range   Color, Urine YELLOW YELLOW   APPearance HAZY (A) CLEAR   Specific Gravity, Urine 1.025 1.005 - 1.030   pH 5.5 5.0 - 8.0   Glucose, UA NEGATIVE NEGATIVE mg/dL   Hgb urine dipstick SMALL (A) NEGATIVE   Bilirubin Urine NEGATIVE NEGATIVE   Ketones, ur NEGATIVE NEGATIVE mg/dL   Protein, ur TRACE (A) NEGATIVE mg/dL   Nitrite NEGATIVE NEGATIVE   Leukocytes, UA MODERATE (A) NEGATIVE  CBC with Differential  Result Value Ref Range   WBC 7.8 4.0 - 10.5 K/uL   RBC 4.31 3.87 -  5.11 MIL/uL   Hemoglobin 13.7 12.0 - 15.0 g/dL   HCT 16.1 09.6 - 04.5 %   MCV 93.0 78.0 - 100.0 fL   MCH 31.8 26.0 - 34.0 pg   MCHC 34.2 30.0 - 36.0 g/dL   RDW 40.9 81.1 - 91.4 %   Platelets 217 150 - 400 K/uL   Neutrophils Relative % 52 %   Neutro Abs 4.0 1.7 - 7.7 K/uL   Lymphocytes Relative 35 %   Lymphs Abs 2.7 0.7 - 4.0 K/uL   Monocytes Relative 7 %   Monocytes Absolute 0.6 0.1 - 1.0 K/uL   Eosinophils Relative 6 %   Eosinophils Absolute 0.5 0.0 - 0.7 K/uL   Basophils Relative 0 %   Basophils Absolute 0.0 0.0 - 0.1 K/uL  Lipase, blood  Result  Value Ref Range   Lipase 35 11 - 51 U/L  Comprehensive metabolic panel  Result Value Ref Range   Sodium 140 135 - 145 mmol/L   Potassium 3.8 3.5 - 5.1 mmol/L   Chloride 108 101 - 111 mmol/L   CO2 23 22 - 32 mmol/L   Glucose, Bld 117 (H) 65 - 99 mg/dL   BUN 13 6 - 20 mg/dL   Creatinine, Ser 4.091.09 (H) 0.44 - 1.00 mg/dL   Calcium 9.0 8.9 - 81.110.3 mg/dL   Total Protein 6.6 6.5 - 8.1 g/dL   Albumin 3.8 3.5 - 5.0 g/dL   AST 17 15 - 41 U/L   ALT 16 14 - 54 U/L   Alkaline Phosphatase 95 38 - 126 U/L   Total Bilirubin 0.4 0.3 - 1.2 mg/dL   GFR calc non Af Amer >60 >60 mL/min   GFR calc Af Amer >60 >60 mL/min   Anion gap 9 5 - 15  Urine microscopic-add on  Result Value Ref Range   Squamous Epithelial / LPF 6-30 (A) NONE SEEN   WBC, UA 6-30 0 - 5 WBC/hpf   RBC / HPF 0-5 0 - 5 RBC/hpf   Bacteria, UA MANY (A) NONE SEEN   Imaging Review Ct Renal Stone Study  06/08/2015  CLINICAL DATA:  Acute onset of right constant flank pain. Initial encounter. EXAM: CT ABDOMEN AND PELVIS WITHOUT CONTRAST TECHNIQUE: Multidetector CT imaging of the abdomen and pelvis was performed following the standard protocol without IV contrast. COMPARISON:  Pelvic ultrasound performed 03/16/2010 FINDINGS: The visualized lung bases are clear. The liver and spleen are unremarkable in appearance. The gallbladder is within normal limits. The pancreas and adrenal glands are  unremarkable. The kidneys are unremarkable in appearance. There is no evidence of hydronephrosis. No renal or ureteral stones are seen. No perinephric stranding is appreciated. No free fluid is identified. The small bowel is unremarkable in appearance. The stomach is within normal limits. No acute vascular abnormalities are seen. Minimal calcification is noted along the common iliac arteries bilaterally. The appendix is normal in caliber, without evidence of appendicitis. The colon is unremarkable in appearance. The bladder is mildly distended and grossly unremarkable. The uterus is unremarkable in appearance. The ovaries are relatively symmetric. No suspicious adnexal masses are seen. No inguinal lymphadenopathy is seen. No acute osseous abnormalities are identified. IMPRESSION: Unremarkable noncontrast CT of the abdomen and pelvis. Electronically Signed   By: Roanna RaiderJeffery  Chang M.D.   On: 06/08/2015 01:13   I have personally reviewed and evaluated these images and lab results as part of my medical decision-making.  MDM   Final diagnoses:  Right flank pain    Right flank pain worrisome for possible kidney stone. IV started with normal saline and she is given morphine for pain with good relief of pain. She is sent for CT renal stone protocol showing no evidence of nephrolithiasis or urolithiasis. Urinalysis does show many bacteria although it is perhaps in contaminated specimen. Specimen was sent for culture. She's given a dose of ceftriaxone in the ED. She is discharged with prescriptions for cephalexin, ondansetron, and oxycodone-acetaminophen. Follow-up with PCP in 10 days to repeat urinalysis. Return precautions given.   I personally performed the services described in this documentation, which was scribed in my presence. The recorded information has been reviewed and is accurate.      Dione Boozeavid Fuller Makin, MD 06/08/15 [phone]

## 2015-06-08 NOTE — Discharge Instructions (Signed)
Urinary Tract Infection °Urinary tract infections (UTIs) can develop anywhere along your urinary tract. Your urinary tract is your body's drainage system for removing wastes and extra water. Your urinary tract includes two kidneys, two ureters, a bladder, and a urethra. Your kidneys are a pair of bean-shaped organs. Each kidney is about the size of your fist. They are located below your ribs, one on each side of your spine. °CAUSES °Infections are caused by microbes, which are microscopic organisms, including fungi, viruses, and bacteria. These organisms are so small that they can only be seen through a microscope. Bacteria are the microbes that most commonly cause UTIs. °SYMPTOMS  °Symptoms of UTIs may vary by age and gender of the patient and by the location of the infection. Symptoms in young women typically include a frequent and intense urge to urinate and a painful, burning feeling in the bladder or urethra during urination. Older women and men are more likely to be tired, shaky, and weak and have muscle aches and abdominal pain. A fever may mean the infection is in your kidneys. Other symptoms of a kidney infection include pain in your back or sides below the ribs, nausea, and vomiting. °DIAGNOSIS °To diagnose a UTI, your caregiver will ask you about your symptoms. Your caregiver will also ask you to provide a urine sample. The urine sample will be tested for bacteria and white blood cells. White blood cells are made by your body to help fight infection. °TREATMENT  °Typically, UTIs can be treated with medication. Because most UTIs are caused by a bacterial infection, they usually can be treated with the use of antibiotics. The choice of antibiotic and length of treatment depend on your symptoms and the type of bacteria causing your infection. °HOME CARE INSTRUCTIONS °· If you were prescribed antibiotics, take them exactly as your caregiver instructs you. Finish the medication even if you feel better after  you have only taken some of the medication. °· Drink enough water and fluids to keep your urine clear or pale yellow. °· Avoid caffeine, tea, and carbonated beverages. They tend to irritate your bladder. °· Empty your bladder often. Avoid holding urine for long periods of time. °· Empty your bladder before and after sexual intercourse. °· After a bowel movement, women should cleanse from front to back. Use each tissue only once. °SEEK MEDICAL CARE IF:  °· You have back pain. °· You develop a fever. °· Your symptoms do not begin to resolve within 3 days. °SEEK IMMEDIATE MEDICAL CARE IF:  °· You have severe back pain or lower abdominal pain. °· You develop chills. °· You have nausea or vomiting. °· You have continued burning or discomfort with urination. °MAKE SURE YOU:  °· Understand these instructions. °· Will watch your condition. °· Will get help right away if you are not doing well or get worse. °  °This information is not intended to replace advice given to you by your health care provider. Make sure you discuss any questions you have with your health care provider. °  °Document Released: 04/08/2005 Document Revised: 03/20/2015 Document Reviewed: 08/07/2011 °Elsevier Interactive Patient Education ©2016 Else[employer] Inc. ° °Cephalexin tablets or capsules °What is this medicine? °CEPHALEXIN (sef a LEX in) is a cephalosporin antibiotic. It is used to treat certain kinds of bacterial infections It will not work for colds, flu, or other viral infections. °This medicine may be used for other purposes; ask your health care provider or pharmacist if you have questions. °What should I   tell my health care provider before I take this medicine? They need to know if you have any of these conditions: -kidney disease -stomach or intestine problems, especially colitis -an unusual or allergic reaction to cephalexin, other cephalosporins, penicillins, other antibiotics, medicines, foods, dyes or preservatives -pregnant or trying  to get pregnant -breast-feeding How should I use this medicine? Take this medicine by mouth with a full glass of water. Follow the directions on the prescription label. This medicine can be taken with or without food. Take your medicine at regular intervals. Do not take your medicine more often than directed. Take all of your medicine as directed even if you think you are better. Do not skip doses or stop your medicine early. Talk to your pediatrician regarding the use of this medicine in children. While this drug may be prescribed for selected conditions, precautions do apply. Overdosage: If you think you have taken too much of this medicine contact a poison control center or emergency room at once. NOTE: This medicine is only for you. Do not share this medicine with others. What if I miss a dose? If you miss a dose, take it as soon as you can. If it is almost time for your next dose, take only that dose. Do not take double or extra doses. There should be at least 4 to 6 hours between doses. What may interact with this medicine? -probenecid -some other antibiotics This list may not describe all possible interactions. Give your health care provider a list of all the medicines, herbs, non-prescription drugs, or dietary supplements you use. Also tell them if you smoke, drink alcohol, or use illegal drugs. Some items may interact with your medicine. What should I watch for while using this medicine? Tell your doctor or health care professional if your symptoms do not begin to improve in a few days. Do not treat diarrhea with over the counter products. Contact your doctor if you have diarrhea that lasts more than 2 days or if it is severe and watery. If you have diabetes, you may get a false-positive result for sugar in your urine. Check with your doctor or health care professional. What side effects may I notice from receiving this medicine? Side effects that you should report to your doctor or health  care professional as soon as possible: -allergic reactions like skin rash, itching or hives, swelling of the face, lips, or tongue -breathing problems -pain or trouble passing urine -redness, blistering, peeling or loosening of the skin, including inside the mouth -severe or watery diarrhea -unusually weak or tired -yellowing of the eyes, skin Side effects that usually do not require medical attention (report to your doctor or health care professional if they continue or are bothersome): -gas or heartburn -genital or anal irritation -headache -joint or muscle pain -nausea, vomiting This list may not describe all possible side effects. Call your doctor for medical advice about side effects. You may report side effects to FDA at 1-800-FDA-1088. Where should I keep my medicine? Keep out of the reach of children. Store at room temperature between 59 and 86 degrees F (15 and 30 degrees C). Throw away any unused medicine after the expiration date. NOTE: This sheet is a summary. It may not cover all possible information. If you have questions about this medicine, talk to your doctor, pharmacist, or health care provider.    2016, Elsevier/Gold Standard. (2007-10-03 17:09:13)  Acetaminophen; Oxycodone tablets What is this medicine? ACETAMINOPHEN; OXYCODONE (a set a MEE noe fen; ox  i KOE done) is a pain reliever. It is used to treat moderate to severe pain. This medicine may be used for other purposes; ask your health care provider or pharmacist if you have questions. What should I tell my health care provider before I take this medicine? They need to know if you have any of these conditions: -brain tumor -Crohn's disease, inflammatory bowel disease, or ulcerative colitis -drug abuse or addiction -head injury -heart or circulation problems -if you often drink alcohol -kidney disease or problems going to the bathroom -liver disease -lung disease, asthma, or breathing problems -an unusual  or allergic reaction to acetaminophen, oxycodone, other opioid analgesics, other medicines, foods, dyes, or preservatives -pregnant or trying to get pregnant -breast-feeding How should I use this medicine? Take this medicine by mouth with a full glass of water. Follow the directions on the prescription label. You can take it with or without food. If it upsets your stomach, take it with food. Take your medicine at regular intervals. Do not take it more often than directed. Talk to your pediatrician regarding the use of this medicine in children. Special care may be needed. Patients over 44 years old may have a stronger reaction and need a smaller dose. Overdosage: If you think you have taken too much of this medicine contact a poison control center or emergency room at once. NOTE: This medicine is only for you. Do not share this medicine with others. What if I miss a dose? If you miss a dose, take it as soon as you can. If it is almost time for your next dose, take only that dose. Do not take double or extra doses. What may interact with this medicine? -alcohol -antihistamines -barbiturates like amobarbital, butalbital, butabarbital, methohexital, pentobarbital, phenobarbital, thiopental, and secobarbital -benztropine -drugs for bladder problems like solifenacin, trospium, oxybutynin, tolterodine, hyoscyamine, and methscopolamine -drugs for breathing problems like ipratropium and tiotropium -drugs for certain stomach or intestine problems like propantheline, homatropine methylbromide, glycopyrrolate, atropine, belladonna, and dicyclomine -general anesthetics like etomidate, ketamine, nitrous oxide, propofol, desflurane, enflurane, halothane, isoflurane, and sevoflurane -medicines for depression, anxiety, or psychotic disturbances -medicines for sleep -muscle relaxants -naltrexone -narcotic medicines (opiates) for pain -phenothiazines like perphenazine, thioridazine, chlorpromazine,  mesoridazine, fluphenazine, prochlorperazine, promazine, and trifluoperazine -scopolamine -tramadol -trihexyphenidyl This list may not describe all possible interactions. Give your health care provider a list of all the medicines, herbs, non-prescription drugs, or dietary supplements you use. Also tell them if you smoke, drink alcohol, or use illegal drugs. Some items may interact with your medicine. What should I watch for while using this medicine? Tell your doctor or health care professional if your pain does not go away, if it gets worse, or if you have new or a different type of pain. You may develop tolerance to the medicine. Tolerance means that you will need a higher dose of the medication for pain relief. Tolerance is normal and is expected if you take this medicine for a long time. Do not suddenly stop taking your medicine because you may develop a severe reaction. Your body becomes used to the medicine. This does NOT mean you are addicted. Addiction is a behavior related to getting and using a drug for a non-medical reason. If you have pain, you have a medical reason to take pain medicine. Your doctor will tell you how much medicine to take. If your doctor wants you to stop the medicine, the dose will be slowly lowered over time to avoid any side effects. You may  get drowsy or dizzy. Do not drive, use machinery, or do anything that needs mental alertness until you know how this medicine affects you. Do not stand or sit up quickly, especially if you are an older patient. This reduces the risk of dizzy or fainting spells. Alcohol may interfere with the effect of this medicine. Avoid alcoholic drinks. There are different types of narcotic medicines (opiates) for pain. If you take more than one type at the same time, you may have more side effects. Give your health care provider a list of all medicines you use. Your doctor will tell you how much medicine to take. Do not take more medicine than  directed. Call emergency for help if you have problems breathing. The medicine will cause constipation. Try to have a bowel movement at least every 2 to 3 days. If you do not have a bowel movement for 3 days, call your doctor or health care professional. Do not take Tylenol (acetaminophen) or medicines that have acetaminophen with this medicine. Too much acetaminophen can be very dangerous. Many nonprescription medicines contain acetaminophen. Always read the labels carefully to avoid taking more acetaminophen. What side effects may I notice from receiving this medicine? Side effects that you should report to your doctor or health care professional as soon as possible: -allergic reactions like skin rash, itching or hives, swelling of the face, lips, or tongue -breathing difficulties, wheezing -confusion -light headedness or fainting spells -severe stomach pain -unusually weak or tired -yellowing of the skin or the whites of the eyes Side effects that usually do not require medical attention (report to your doctor or health care professional if they continue or are bothersome): -dizziness -drowsiness -nausea -vomiting This list may not describe all possible side effects. Call your doctor for medical advice about side effects. You may report side effects to FDA at 1-800-FDA-1088. Where should I keep my medicine? Keep out of the reach of children. This medicine can be abused. Keep your medicine in a safe place to protect it from theft. Do not share this medicine with anyone. Selling or giving away this medicine is dangerous and against the law. This medicine may cause accidental overdose and death if it taken by other adults, children, or pets. Mix any unused medicine with a substance like cat litter or coffee grounds. Then throw the medicine away in a sealed container like a sealed bag or a coffee can with a lid. Do not use the medicine after the expiration date. Store at room temperature between  20 and 25 degrees C (68 and 77 degrees F). NOTE: This sheet is a summary. It may not cover all possible information. If you have questions about this medicine, talk to your doctor, pharmacist, or health care provider.    2016, Elsevier/Gold Standard. (2014-05-30 15:18:46)  Ondansetron tablets What is this medicine? ONDANSETRON (on DAN se tron) is used to treat nausea and vomiting caused by chemotherapy. It is also used to prevent or treat nausea and vomiting after surgery. This medicine may be used for other purposes; ask your health care provider or pharmacist if you have questions. What should I tell my health care provider before I take this medicine? They need to know if you have any of these conditions: -heart disease -history of irregular heartbeat -liver disease -low levels of magnesium or potassium in the blood -an unusual or allergic reaction to ondansetron, granisetron, other medicines, foods, dyes, or preservatives -pregnant or trying to get pregnant -breast-feeding How should I use this  medicine? Take this medicine by mouth with a glass of water. Follow the directions on your prescription label. Take your doses at regular intervals. Do not take your medicine more often than directed. Talk to your pediatrician regarding the use of this medicine in children. Special care may be needed. Overdosage: If you think you have taken too much of this medicine contact a poison control center or emergency room at once. NOTE: This medicine is only for you. Do not share this medicine with others. What if I miss a dose? If you miss a dose, take it as soon as you can. If it is almost time for your next dose, take only that dose. Do not take double or extra doses. What may interact with this medicine? Do not take this medicine with any of the following medications: -apomorphine -certain medicines for fungal infections like fluconazole, itraconazole, ketoconazole, posaconazole,  voriconazole -cisapride -dofetilide -dronedarone -pimozide -thioridazine -ziprasidone This medicine may also interact with the following medications: -carbamazepine -certain medicines for depression, anxiety, or psychotic disturbances -fentanyl -linezolid -MAOIs like Carbex, Eldepryl, Marplan, Nardil, and Parnate -methylene blue (injected into a vein) -other medicines that prolong the QT interval (cause an abnormal heart rhythm) -phenytoin -rifampicin -tramadol This list may not describe all possible interactions. Give your health care provider a list of all the medicines, herbs, non-prescription drugs, or dietary supplements you use. Also tell them if you smoke, drink alcohol, or use illegal drugs. Some items may interact with your medicine. What should I watch for while using this medicine? Check with your doctor or health care professional right away if you have any sign of an allergic reaction. What side effects may I notice from receiving this medicine? Side effects that you should report to your doctor or health care professional as soon as possible: -allergic reactions like skin rash, itching or hives, swelling of the face, lips or tongue -breathing problems -confusion -dizziness -fast or irregular heartbeat -feeling faint or lightheaded, falls -fever and chills -loss of balance or coordination -seizures -sweating -swelling of the hands or feet -tightness in the chest -tremors -unusually weak or tired Side effects that usually do not require medical attention (report to your doctor or health care professional if they continue or are bothersome): -constipation or diarrhea -headache This list may not describe all possible side effects. Call your doctor for medical advice about side effects. You may report side effects to FDA at 1-800-FDA-1088. Where should I keep my medicine? Keep out of the reach of children. Store between 2 and 30 degrees C (36 and 86 degrees F).  Throw away any unused medicine after the expiration date. NOTE: This sheet is a summary. It may not cover all possible information. If you have questions about this medicine, talk to your doctor, pharmacist, or health care provider.    2016, Elsevier/Gold Standard. (2013-04-05 16:27:45)

## 2015-06-08 NOTE — ED Notes (Signed)
MD at bedside. 

## 2015-06-10 LAB — URINE CULTURE

## 2015-06-10 MED FILL — Oxycodone w/ Acetaminophen Tab 5-325 MG: ORAL | Qty: 6 | Status: AC

## 2015-06-12 ENCOUNTER — Telehealth (HOSPITAL_BASED_OUTPATIENT_CLINIC_OR_DEPARTMENT_OTHER): Payer: Self-pay | Admitting: Emergency Medicine

## 2015-06-12 NOTE — Telephone Encounter (Signed)
Post ED Visit - Positive Culture Follow-up  Culture report reviewed by antimicrobial stewardship pharmacist:  []  [name], Pharm.D. []  [name], Pharm.D., BCPS []  [name], Pharm.D. []  [name], Pharm.D., BCPS []  [name], [address] BCPS, AAHIVP []  [name], Pharm.D., BCPS, AAHIVP []  [name], Pharm.D. [x]  [name], [address]  Positive urine culture  Treated with cephalexin, organism sensitive to the same and no further patient follow-up is required at this time.  [name], [name] 06/12/2015, 4:44 PM

## 2016-02-27 ENCOUNTER — Other Ambulatory Visit (INDEPENDENT_AMBULATORY_CARE_PROVIDER_SITE_OTHER): Payer: Medicaid Other | Admitting: Adult Health

## 2016-02-27 ENCOUNTER — Ambulatory Visit (INDEPENDENT_AMBULATORY_CARE_PROVIDER_SITE_OTHER): Payer: Medicaid Other | Admitting: Adult Health

## 2016-02-27 ENCOUNTER — Encounter: Payer: Self-pay | Admitting: Adult Health

## 2016-02-27 VITALS — BP 144/92 | HR 78 | Ht 66.0 in | Wt 319.0 lb

## 2016-02-27 DIAGNOSIS — B379 Candidiasis, unspecified: Secondary | ICD-10-CM | POA: Diagnosis not present

## 2016-02-27 DIAGNOSIS — N898 Other specified noninflammatory disorders of vagina: Secondary | ICD-10-CM | POA: Diagnosis not present

## 2016-02-27 MED ORDER — FLUCONAZOLE 100 MG PO TABS: ORAL_TABLET | ORAL | 0 refills | Status: DC

## 2016-02-27 NOTE — Progress Notes (Signed)
Subjective:     Patient ID: [name], female   DOB: March 12, 1976, 40 y.o.   MRN: [id number]  HPI [name] is a 40 year old white female in complaining of vaginal irritation and itching for several days now, had yeast infection in past after taking antibiotics for teeth, and feels like never truly got over it. She has had an abscess recently and has follow up in am with dentist and may have all her teeth pulled on top.  Review of Systems + vaginal irritation and itching  + oral abscess recently (bad teeth)  Reviewed past medical,surgical, social and family history. Reviewed medications and allergies.     Objective:   Physical Exam BP (!) 144/92   Pulse 78   Ht 5\' 6"  (1.676 m)   Wt (!) 319 lb (144.7 kg)   LMP 02/24/2016   BMI 51.49 kg/m    Skin warm and dry.She has multiple caries in top front teeth.  Pelvic: external genitalia is red and has areas of excoriation, vagina: period like blood, no odor,urethra has no lesions or masses noted, cervix:smooth and bulbous, uterus: normal size, shape and contour, non tender, no masses felt, adnexa: no masses or tenderness noted. Bladder is non tender and no masses felt.  Painted labia and vagina with gentian violet and will give diflucan.Pat dry, do not rub.  Assessment:     Vaginal irritation Yeast after antibiotics     Plan:       Rx diflucan 100 mg #10 take 1 daily for 10 days Pat area dry, do not rub Return in 3 weeks for pap and physical

## 2016-02-27 NOTE — Patient Instructions (Signed)
Take diflucan 1 daily x 10 days  Return in 3 weeks for pap and physical

## 2016-03-19 ENCOUNTER — Encounter: Payer: Self-pay | Admitting: Adult Health

## 2016-03-19 ENCOUNTER — Other Ambulatory Visit (HOSPITAL_COMMUNITY)
Admission: RE | Admit: 2016-03-19 | Discharge: 2016-03-19 | Disposition: A | Discharge status: Home / Self Care | Payer: Medicaid Other | Source: Ambulatory Visit | Attending: Adult Health | Admitting: Adult Health

## 2016-03-19 ENCOUNTER — Ambulatory Visit (INDEPENDENT_AMBULATORY_CARE_PROVIDER_SITE_OTHER): Payer: Medicaid Other | Admitting: Adult Health

## 2016-03-19 VITALS — BP 130/70 | HR 102 | Ht 66.0 in | Wt 317.0 lb

## 2016-03-19 DIAGNOSIS — Z01411 Encounter for gynecological examination (general) (routine) with abnormal findings: Secondary | ICD-10-CM | POA: Diagnosis present

## 2016-03-19 DIAGNOSIS — Z124 Encounter for screening for malignant neoplasm of cervix: Secondary | ICD-10-CM

## 2016-03-19 DIAGNOSIS — N926 Irregular menstruation, unspecified: Secondary | ICD-10-CM

## 2016-03-19 DIAGNOSIS — Z01419 Encounter for gynecological examination (general) (routine) without abnormal findings: Secondary | ICD-10-CM | POA: Diagnosis not present

## 2016-03-19 DIAGNOSIS — Z1151 Encounter for screening for human papillomavirus (HPV): Secondary | ICD-10-CM | POA: Insufficient documentation

## 2016-03-19 DIAGNOSIS — N898 Other specified noninflammatory disorders of vagina: Secondary | ICD-10-CM | POA: Diagnosis not present

## 2016-03-19 DIAGNOSIS — Z113 Encounter for screening for infections with a predominantly sexual mode of transmission: Secondary | ICD-10-CM | POA: Diagnosis present

## 2016-03-19 DIAGNOSIS — R1032 Left lower quadrant pain: Secondary | ICD-10-CM

## 2016-03-19 DIAGNOSIS — Z0000 Encounter for general adult medical examination without abnormal findings: Secondary | ICD-10-CM

## 2016-03-19 LAB — POCT WET PREP (WET MOUNT): CLUE CELLS WET PREP WHIFF POC: NEGATIVE

## 2016-03-19 NOTE — Patient Instructions (Signed)
Return in 1 week for US  Physical in 1 year, pap in 3 if normal Mammogram at 41

## 2016-03-19 NOTE — Progress Notes (Signed)
Patient ID: Judith RankinsStacey N Favero, female   DOB: March 05, 1976, 40 y.o.   MRN: [id number] History of Present Illness: Judith StanleyStacey is a 40 year old white female in for a well woman gyn exam and pap.She is complaining of vaginal discharge with odor at times.She has had to take more antibiotics for tooth abscess.Has poor oral care.Has pain on low abdomen at times and periods more irregular and cramps. She was treated 02/27/16 for yeast and is better.  PCP is Dr Sudie BaileyKnowlton.   Current Medications, Allergies, Past Medical History, Past Surgical History, Family History and Social History were [facility].     Review of Systems: Patient denies any headaches, hearing loss, fatigue, blurred vision, shortness of breath, chest pain, problems with bowel movements, urination, or intercourse(none in over a year). No joint pain(has body aches) or mood swings.Has no desire for sex on seroquel and sometimes has discharge right nipple if squeezed. See HPI for positives.  Physical Exam:BP 130/70 (BP Location: Left Arm, Patient Position: Sitting, Cuff Size: Large)   Pulse (!) 102   Ht 5\' 6"  (1.676 m)   Wt (!) 317 lb (143.8 kg)   LMP 02/24/2016   BMI 51.17 kg/m  General:  Well developed, well nourished, no acute distress Skin:  Warm and dry, has areas that are scared from healed boils Neck:  Midline trachea, normal thyroid, good ROM, no lymphadenopathy Lungs; Clear to auscultation bilaterally Breast:  No dominant palpable mass, retraction, or nipple discharge Cardiovascular: Regular rate and rhythm Abdomen:  Soft, non tender, no hepatosplenomegaly Pelvic:  External genitalia is normal in appearance, no lesions.  The vagina is normal in appearance. Urethra has no lesions or masses. The cervix is smooth, pap with GC/CHL and HPV performed. Uterus is felt to be normal size, shape, and contour.  No adnexal masses, LLQ tenderness noted.Bladder is non tender, no masses felt.Wet prep:few  WBCs Extremities/musculoskeletal:  No swelling or varicosities noted, no clubbing or cyanosis Psych:  No mood changes, alert and cooperative,seems happy, but concerned over grandpa, he is in ER, and she helps care for him    Impression: 1. Encounter for gynecological examination with Papanicolaou smear of cervix   2. Routine cervical smear   3. LLQ pain   4. Vaginal discharge   5. Irregular periods   6. Screening examination for STD (sexually transmitted disease)       Plan: Check CBC,CMP,TSH and lipids,A1c,vitamin D, HIV,RPR and HSV 2 Return in 1 week for GYN US Physical in 1 year, pap in 3 if normal Mammogram at 41

## 2016-03-20 LAB — COMPREHENSIVE METABOLIC PANEL
A/G RATIO: 1.6 (ref 1.2–2.2)
ALT: 18 IU/L (ref 0–32)
AST: 16 IU/L (ref 0–40)
Albumin: 4.6 g/dL (ref 3.5–5.5)
Alkaline Phosphatase: 86 IU/L (ref 39–117)
BUN / CREAT RATIO: 10 (ref 9–23)
BUN: 12 mg/dL (ref 6–20)
Bilirubin Total: 0.4 mg/dL (ref 0.0–1.2)
CALCIUM: 9.6 mg/dL (ref 8.7–10.2)
CO2: 23 mmol/L (ref 18–29)
Chloride: 100 mmol/L (ref 96–106)
Creatinine, Ser: 1.16 mg/dL — ABNORMAL HIGH (ref 0.57–1.00)
GFR calc Af Amer: 69 mL/min/{1.73_m2} (ref >59)
GFR, EST NON AFRICAN AMERICAN: 59 mL/min/{1.73_m2} — AB (ref >59)
GLOBULIN, TOTAL: 2.8 g/dL (ref 1.5–4.5)
Glucose: 100 mg/dL — ABNORMAL HIGH (ref 65–99)
POTASSIUM: 4.2 mmol/L (ref 3.5–5.2)
SODIUM: 139 mmol/L (ref 134–144)
Total Protein: 7.4 g/dL (ref 6.0–8.5)

## 2016-03-20 LAB — LIPID PANEL
CHOL/HDL RATIO: 7.6 ratio — AB (ref 0.0–4.4)
CHOLESTEROL TOTAL: 205 mg/dL — AB (ref 100–199)
HDL: 27 mg/dL — ABNORMAL LOW (ref >39)
LDL CALC: 153 mg/dL — AB (ref 0–99)
Triglycerides: 123 mg/dL (ref 0–149)
VLDL Cholesterol Cal: 25 mg/dL (ref 5–40)

## 2016-03-20 LAB — CBC
HEMOGLOBIN: 13.5 g/dL (ref 11.1–15.9)
Hematocrit: 38.9 % (ref 34.0–46.6)
MCH: 30.1 pg (ref 26.6–33.0)
MCHC: 34.7 g/dL (ref 31.5–35.7)
MCV: 87 fL (ref 79–97)
Platelets: 231 10*3/uL (ref 150–379)
RBC: 4.49 x10E6/uL (ref 3.77–5.28)
RDW: 13.9 % (ref 12.3–15.4)
WBC: 8.9 10*3/uL (ref 3.4–10.8)

## 2016-03-20 LAB — HEMOGLOBIN A1C
ESTIMATED AVERAGE GLUCOSE: 108 mg/dL
HEMOGLOBIN A1C: 5.4 % (ref 4.8–5.6)

## 2016-03-20 LAB — HIV ANTIBODY (ROUTINE TESTING W REFLEX): HIV Screen 4th Generation wRfx: NONREACTIVE

## 2016-03-20 LAB — VITAMIN D 25 HYDROXY (VIT D DEFICIENCY, FRACTURES): VIT D 25 HYDROXY: 9.1 ng/mL — AB (ref 30.0–100.0)

## 2016-03-20 LAB — RPR: RPR Ser Ql: NONREACTIVE

## 2016-03-20 LAB — TSH: TSH: 1.4 u[IU]/mL (ref 0.450–4.500)

## 2016-03-20 LAB — HSV 2 ANTIBODY, IGG: HSV 2 Glycoprotein G Ab, IgG: 22.5 index — ABNORMAL HIGH (ref 0.00–0.90)

## 2016-03-23 LAB — CYTOLOGY - PAP

## 2016-03-26 ENCOUNTER — Ambulatory Visit (INDEPENDENT_AMBULATORY_CARE_PROVIDER_SITE_OTHER): Payer: Medicaid Other

## 2016-03-26 DIAGNOSIS — N854 Malposition of uterus: Secondary | ICD-10-CM | POA: Diagnosis not present

## 2016-03-26 DIAGNOSIS — R1032 Left lower quadrant pain: Secondary | ICD-10-CM

## 2016-03-26 NOTE — Progress Notes (Signed)
PELVIC US TA/TV: Homogeneous anteverted uterus wnl,normal ov's bilat (mobile),EEC 6.1 mm,no free fluid,no pain during ultrasound

## 2016-03-27 ENCOUNTER — Telehealth: Payer: Self-pay | Admitting: Adult Health

## 2016-03-27 ENCOUNTER — Encounter: Payer: Self-pay | Admitting: Adult Health

## 2016-03-27 DIAGNOSIS — E559 Vitamin D deficiency, unspecified: Secondary | ICD-10-CM

## 2016-03-27 DIAGNOSIS — E785 Hyperlipidemia, unspecified: Secondary | ICD-10-CM

## 2016-03-27 DIAGNOSIS — R7989 Other specified abnormal findings of blood chemistry: Secondary | ICD-10-CM

## 2016-03-27 HISTORY — DX: Hyperlipidemia, unspecified: E78.5

## 2016-03-27 HISTORY — DX: Other specified abnormal findings of blood chemistry: R79.89

## 2016-03-27 HISTORY — DX: Vitamin D deficiency, unspecified: E55.9

## 2016-03-27 NOTE — Telephone Encounter (Signed)
Pt aware US normal, and pap negative with negative HPV and GC/CHL, on labs vitamin D low 9.1 take vitamin D3 5000 IU daily and lipids elevated and creatanine 1.16, needs to follow up with PCP about labs, and she voices understanding and says she will call me will route labs to Dr Sudie BaileyKnowlton

## 2016-04-30 ENCOUNTER — Encounter (HOSPITAL_COMMUNITY): Payer: Self-pay | Admitting: Anesthesiology

## 2016-04-30 ENCOUNTER — Encounter (HOSPITAL_COMMUNITY): Payer: Self-pay | Admitting: *Deleted

## 2016-04-30 NOTE — Anesthesia Preprocedure Evaluation (Deleted)
Anesthesia Evaluation  Patient identified by MRN, date of birth, ID band Patient awake    Reviewed: Allergy & Precautions, NPO status , Patient's Chart, lab work & pertinent test results  Airway        Dental  (+) Poor Dentition, Dental Advisory Given   Pulmonary asthma , Current Smoker,           Cardiovascular hypertension, Pt. on medications + DOE  + dysrhythmias (1st degree AV block)   HLD   Neuro/Psych  Headaches, PSYCHIATRIC DISORDERS Anxiety Bipolar Disorder H/o concussion, cervical disc disorder with radiculopathy    GI/Hepatic GERD  Medicated,  Endo/Other  Morbid obesity  Renal/GU      Musculoskeletal  (+) Fibromyalgia -  Abdominal (+) + obese,   Peds  Hematology   Anesthesia Other Findings Vitamin D deficiency  Reproductive/Obstetrics                             Anesthesia Physical Anesthesia Plan  ASA: III  Anesthesia Plan: General   Post-op Pain Management:    Induction: Intravenous  Airway Management Planned: Video Laryngoscope Planned and Nasal ETT  Additional Equipment:   Intra-op Plan:   Post-operative Plan: Extubation in OR  Informed Consent:   Dental advisory given  Plan Discussed with: CRNA  Anesthesia Plan Comments: (Risks of general anesthesia discussed including, but not limited to, sore throat, hoarse voice, chipped/damaged teeth, injury to vocal cords, nausea and vomiting, allergic reactions, lung infection, heart attack, stroke, and death. All questions answered. )        Anesthesia Quick Evaluation

## 2016-05-01 ENCOUNTER — Ambulatory Visit (HOSPITAL_COMMUNITY): Admission: RE | Admit: 2016-05-01 | Payer: Medicaid Other | Source: Ambulatory Visit | Admitting: Oral Surgery

## 2016-05-01 HISTORY — DX: Bipolar disorder, unspecified: F31.9

## 2016-05-01 HISTORY — DX: Concussion with loss of consciousness of unspecified duration, initial encounter: S06.0X9A

## 2016-05-01 HISTORY — DX: Pneumonia, unspecified organism: J18.9

## 2016-05-01 HISTORY — DX: Gastro-esophageal reflux disease without esophagitis: K21.9

## 2016-05-01 HISTORY — DX: Dyspnea, unspecified: R06.00

## 2016-05-01 HISTORY — DX: Concussion with loss of consciousness status unknown, initial encounter: S06.0XAA

## 2016-05-01 SURGERY — MULTIPLE EXTRACTION WITH ALVEOLOPLASTY
Anesthesia: General

## 2016-05-01 MED ORDER — LIDOCAINE-EPINEPHRINE 2 %-1:100000 IJ SOLN
INTRAMUSCULAR | Status: AC
  Filled 2016-05-01: qty 1

## 2016-05-27 ENCOUNTER — Encounter (HOSPITAL_COMMUNITY): Payer: Self-pay

## 2016-05-27 ENCOUNTER — Other Ambulatory Visit: Payer: Self-pay

## 2016-05-27 ENCOUNTER — Encounter (HOSPITAL_COMMUNITY)
Admission: RE | Admit: 2016-05-27 | Discharge: 2016-05-27 | Disposition: A | Discharge status: Home / Self Care | Payer: Medicaid Other | Source: Ambulatory Visit | Attending: Oral Surgery | Admitting: Oral Surgery

## 2016-05-27 DIAGNOSIS — K0889 Other specified disorders of teeth and supporting structures: Secondary | ICD-10-CM | POA: Insufficient documentation

## 2016-05-27 DIAGNOSIS — Z01818 Encounter for other preprocedural examination: Secondary | ICD-10-CM | POA: Insufficient documentation

## 2016-05-27 DIAGNOSIS — F40232 Fear of other medical care: Secondary | ICD-10-CM | POA: Diagnosis not present

## 2016-05-27 DIAGNOSIS — Z01810 Encounter for preprocedural cardiovascular examination: Secondary | ICD-10-CM | POA: Insufficient documentation

## 2016-05-27 LAB — BASIC METABOLIC PANEL
ANION GAP: 8 (ref 5–15)
BUN: 9 mg/dL (ref 6–20)
CALCIUM: 8.9 mg/dL (ref 8.9–10.3)
CO2: 23 mmol/L (ref 22–32)
CREATININE: 0.96 mg/dL (ref 0.44–1.00)
Chloride: 109 mmol/L (ref 101–111)
Glucose, Bld: 118 mg/dL — ABNORMAL HIGH (ref 65–99)
Potassium: 3.4 mmol/L — ABNORMAL LOW (ref 3.5–5.1)
SODIUM: 140 mmol/L (ref 135–145)

## 2016-05-27 LAB — CBC
HCT: 39.9 % (ref 36.0–46.0)
Hemoglobin: 13.1 g/dL (ref 12.0–15.0)
MCH: 30 pg (ref 26.0–34.0)
MCHC: 32.8 g/dL (ref 30.0–36.0)
MCV: 91.3 fL (ref 78.0–100.0)
PLATELETS: 181 10*3/uL (ref 150–400)
RBC: 4.37 MIL/uL (ref 3.87–5.11)
RDW: 13.6 % (ref 11.5–15.5)
WBC: 5.8 10*3/uL (ref 4.0–10.5)

## 2016-05-27 LAB — HCG, SERUM, QUALITATIVE: PREG SERUM: NEGATIVE

## 2016-05-27 NOTE — Progress Notes (Signed)
PCP - Gareth MorganSteve Knowlton Cardiologist - denies  Chest x-ray - not needed EKG - 05/27/16 Stress Test - denies ECHO - denies Cardiac Cath - denies    Patient denies shortness of breath, fever, cough and chest pain at PAT appointment

## 2016-05-27 NOTE — H&P (Signed)
HISTORY AND PHYSICAL  Judith RankinsStacey N [name] is a 40 y.o. female patient with CC: painful teeth. Severe dental phobia  No diagnosis found.  Past Medical History:  Diagnosis Date  . Asthma   . Bipolar disorder (HCC)   . Concussion   . Dyslipidemia (high LDL; low HDL) 03/27/2016  . Dyspnea    with exertion  . Elevated serum creatinine 03/27/2016  . Fibromyalgia   . GERD (gastroesophageal reflux disease)   . Headache(784.0)   . Hypertension   . Mental disorder    anxiety  . Pneumonia 2015  . Vitamin D deficiency 03/27/2016    No current facility-administered medications for this encounter.    Current Outpatient Prescriptions  Medication Sig Dispense Refill  . acetaminophen-codeine (TYLENOL #3) 300-30 MG tablet Take 1 tablet by mouth every 8 (eight) hours as needed for moderate pain.    Marland Kitchen. albuterol (PROVENTIL HFA;VENTOLIN HFA) 108 (90 BASE) MCG/ACT inhaler Inhale 2 puffs into the lungs every 6 (six) hours as needed for wheezing or shortness of breath.    Marland Kitchen. albuterol-ipratropium (COMBIVENT) 18-103 MCG/ACT inhaler Inhale 2 puffs into the lungs every 6 (six) hours as needed (for asthmatic symptoms).     Marland Kitchen. atorvastatin (LIPITOR) 20 MG tablet Take 20 mg by mouth every evening.    . diphenhydrAMINE (BENADRYL) 25 mg capsule Take 25-50 mg by mouth every 6 (six) hours as needed (for allergies/sleep (50 mg for sleep)).    . fluticasone (FLONASE) 50 MCG/ACT nasal spray Place 2 sprays into both nostrils at bedtime as needed (for allergies/cold symptoms).    . hydrochlorothiazide (HYDRODIURIL) 25 MG tablet Take 25 mg by mouth daily.    Marland Kitchen. ibuprofen (ADVIL,MOTRIN) 800 MG tablet Take 800 mg by mouth every 8 (eight) hours as needed.     . Menthol, Topical Analgesic, (BIOFREEZE) 4 % GEL Apply 1 application topically 4 (four) times daily as needed (for back pain.).    Marland Kitchen. Menthol-Camphor (ICY HOT ADVANCED RELIEF) 16-11 % CREA Apply 1 application topically 4 (four) times daily as needed (for pain).    .  methocarbamol (ROBAXIN) 750 MG tablet Take 750 mg by mouth every 6 (six) hours as needed for muscle spasms. Typically take 3 times daily    . omeprazole (PRILOSEC) 40 MG capsule Take 40 mg by mouth 2 (two) times daily.     . phenazopyridine (URISTAT) 95 MG tablet Take 95 mg by mouth 3 (three) times daily as needed for pain.    Marland Kitchen. QUEtiapine (SEROQUEL) 25 MG tablet Take 25 mg by mouth at bedtime.    . topiramate (TOPAMAX) 50 MG tablet Take 50 mg by mouth every evening.     . Vitamin D, Ergocalciferol, (DRISDOL) 50000 units CAPS capsule Take 50,000 Units by mouth every Wednesday. Wednesdays      Allergies  Allergen Reactions  . Latex Other (See Comments)    Red at site of latex bandage  . Sulfa Antibiotics Hives and Swelling    Swelling in hands/legs/difficulty swallowing  . Other Other (See Comments)    Pt says pain meds make her nauseous   . Penicillins Rash    Has patient had a PCN reaction causing immediate rash, facial/tongue/throat swelling, SOB or lightheadedness with hypotension: Yes Has patient had a PCN reaction causing severe rash involving mucus membranes or skin necrosis: Yes Has patient had a PCN reaction that required hospitalization No Has patient had a PCN reaction occurring within the last 10 years: No If all of the above answers are "  NO", then may proceed with Cephalosporin use.    Active Problems:   * No active hospital problems. *  Vitals: Last menstrual period 04/21/2016. Lab results:No results found for this or any previous visit (from the past 24 hour(s)). Radiology Results: No results found. General appearance: alert, cooperative and morbidly obese Head: Normocephalic, without obvious abnormality, atraumatic Eyes: negative Nose: Nares normal. Septum midline. Mucosa normal. No drainage or sinus tenderness. Throat: lips, mucosa, and tongue normal; teeth and gums normal and deep dental  caries teeth #'s 4, 8, 9, 12. Pharynx clear. Neck: no adenopathy, supple,  symmetrical, trachea midline and thyroid not enlarged, symmetric, no tenderness/mass/nodules Resp: clear to auscultation bilaterally Cardio: regular rate and rhythm, S1, S2 normal, no murmur, click, rub or gallop  Assessment: Nonrestorable teeth 4, 8, 9, 12.   Plan: Dental extractions with GA. Day surgery.   Macaila Tahir M 05/27/2016

## 2016-05-27 NOTE — Pre-Procedure Instructions (Signed)
Judith RankinsStacey N [name]  05/27/2016      [facility] - [place], [place] - [id number] [place] [id number] Phone: [phone] Fax: [phone]    Your procedure is scheduled on November 17  [facility] Ad[facility] at [employer] A.M.  Call this number if you have problems the morning of surgery:  [phone]   Remember:  Do not eat food or drink liquids after midnight.   Take these medicines the morning of surgery with A SIP OF WATER albuterol (PROVENTIL HFA;VENTOLIN HFA)if needed bring with you the day of surgery, albuterol-ipratropium (COMBIVENTif needed bring with you the day of surgery, methocarbamol (ROBAXIN) if needed, omeprazole (PRILOSEC)  7 days prior to surgery STOP taking any Aspirin, Aleve, Naproxen, Ibuprofen, Motrin, Advil, Goody's, BC's, all herbal medications, fish oil, and all vitamins    Do not wear jewelry, make-up or nail polish.  Do not wear lotions, powders, or perfumes, or deoderant.  Do not shave 48 hours prior to surgery.    Do not bring valuables to the hospital.  [facility] is not responsible for any belongings or valuables.  Contacts, dentures or bridgework may not be worn into surgery.  Leave your suitcase in the car.  After surgery it may be brought to your room.  For patients admitted to the hospital, discharge time will be determined by your treatment team.  Patients discharged the day of surgery will not be allowed to drive home.    Special instructions:   [facility]- Preparing For Surgery  Before surgery, you can play an important role. Because skin is not sterile, your skin needs to be as free of germs as possible. You can reduce the number of germs on your skin by washing with CHG (chlorahexidine gluconate) Soap before surgery.  CHG is an antiseptic cleaner which kills germs and bonds with the skin to continue killing germs even after washing.  Please do not use if you have an allergy  to CHG or antibacterial soaps. If your skin becomes reddened/irritated stop using the CHG.  Do not shave (including legs and underarms) for at least 48 hours prior to first CHG shower. It is OK to shave your face.  Please follow these instructions carefully.   1. Shower the NIGHT BEFORE SURGERY and the MORNING OF SURGERY with CHG.   2. If you chose to wash your hair, wash your hair first as usual with your normal shampoo.  3. After you shampoo, rinse your hair and body thoroughly to remove the shampoo.  4. Use CHG as you would any other liquid soap. You can apply CHG directly to the skin and wash gently with a scrungie or a clean washcloth.   5. Apply the CHG Soap to your body ONLY FROM THE NECK DOWN.  Do not use on open wounds or open sores. Avoid contact with your eyes, ears, mouth and genitals (private parts). Wash genitals (private parts) with your normal soap.  6. Wash thoroughly, paying special attention to the area where your surgery will be performed.  7. Thoroughly rinse your body with warm water from the neck down.  8. DO NOT shower/wash with your normal soap after using and rinsing off the CHG Soap.  9. Pat yourself dry with a CLEAN TOWEL.   10. Wear CLEAN PAJAMAS   11. Place CLEAN SHEETS on your bed the night of your first shower and DO NOT SLEEP WITH PETS.    Day of  Surgery: Do not apply any deodorants/lotions. Please wear clean clothes to the hospital/surgery center.      Please read over the following fact sheets that you were given. Pain Booklet, Coughing and Deep Breathing and Surgical Site Infection Prevention

## 2016-05-29 ENCOUNTER — Encounter (HOSPITAL_COMMUNITY): Payer: Self-pay | Admitting: Certified Registered Nurse Anesthetist

## 2016-05-29 ENCOUNTER — Encounter (HOSPITAL_COMMUNITY)
Admission: RE | Disposition: A | Discharge status: Home / Self Care | Payer: Self-pay | Source: Ambulatory Visit | Attending: Oral Surgery

## 2016-05-29 ENCOUNTER — Ambulatory Visit (HOSPITAL_COMMUNITY): Payer: Medicaid Other | Admitting: Certified Registered Nurse Anesthetist

## 2016-05-29 ENCOUNTER — Ambulatory Visit (HOSPITAL_COMMUNITY)
Admission: RE | Admit: 2016-05-29 | Discharge: 2016-05-29 | Disposition: A | Discharge status: Home / Self Care | Payer: Medicaid Other | Source: Ambulatory Visit | Attending: Oral Surgery | Admitting: Oral Surgery

## 2016-05-29 DIAGNOSIS — K029 Dental caries, unspecified: Secondary | ICD-10-CM | POA: Insufficient documentation

## 2016-05-29 DIAGNOSIS — Z880 Allergy status to penicillin: Secondary | ICD-10-CM | POA: Insufficient documentation

## 2016-05-29 DIAGNOSIS — K219 Gastro-esophageal reflux disease without esophagitis: Secondary | ICD-10-CM | POA: Insufficient documentation

## 2016-05-29 DIAGNOSIS — E785 Hyperlipidemia, unspecified: Secondary | ICD-10-CM | POA: Diagnosis not present

## 2016-05-29 DIAGNOSIS — F40232 Fear of other medical care: Secondary | ICD-10-CM | POA: Diagnosis not present

## 2016-05-29 DIAGNOSIS — J45909 Unspecified asthma, uncomplicated: Secondary | ICD-10-CM | POA: Insufficient documentation

## 2016-05-29 DIAGNOSIS — M797 Fibromyalgia: Secondary | ICD-10-CM | POA: Insufficient documentation

## 2016-05-29 DIAGNOSIS — I10 Essential (primary) hypertension: Secondary | ICD-10-CM | POA: Diagnosis not present

## 2016-05-29 DIAGNOSIS — Z6841 Body Mass Index (BMI) 40.0 and over, adult: Secondary | ICD-10-CM | POA: Insufficient documentation

## 2016-05-29 DIAGNOSIS — F319 Bipolar disorder, unspecified: Secondary | ICD-10-CM | POA: Diagnosis not present

## 2016-05-29 HISTORY — PX: MULTIPLE EXTRACTIONS WITH ALVEOLOPLASTY: SHX5342

## 2016-05-29 SURGERY — MULTIPLE EXTRACTION WITH ALVEOLOPLASTY
Anesthesia: General | Site: Mouth

## 2016-05-29 MED ORDER — ONDANSETRON HCL 4 MG PO TABS: 4.0000 mg | ORAL_TABLET | Freq: Three times a day (TID) | ORAL | 0 refills | Status: DC | PRN

## 2016-05-29 MED ORDER — LIDOCAINE-EPINEPHRINE 2 %-1:100000 IJ SOLN
INTRAMUSCULAR | Status: AC
  Filled 2016-05-29: qty 1

## 2016-05-29 MED ORDER — 0.9 % SODIUM CHLORIDE (POUR BTL) OPTIME
TOPICAL | Status: DC | PRN
  Administered 2016-05-29: 1000 mL

## 2016-05-29 MED ORDER — LIDOCAINE-EPINEPHRINE 2 %-1:100000 IJ SOLN
INTRAMUSCULAR | Status: DC | PRN
  Administered 2016-05-29: 10 mL

## 2016-05-29 MED ORDER — PHENYLEPHRINE 40 MCG/ML (10ML) SYRINGE FOR IV PUSH (FOR BLOOD PRESSURE SUPPORT)
PREFILLED_SYRINGE | INTRAVENOUS | Status: AC
  Filled 2016-05-29: qty 10

## 2016-05-29 MED ORDER — MIDAZOLAM HCL 5 MG/5ML IJ SOLN
INTRAMUSCULAR | Status: DC | PRN
  Administered 2016-05-29: 2 mg via INTRAVENOUS

## 2016-05-29 MED ORDER — LACTATED RINGERS IV SOLN
INTRAVENOUS | Status: DC | PRN
  Administered 2016-05-29: 09:00:00 via INTRAVENOUS

## 2016-05-29 MED ORDER — FENTANYL CITRATE (PF) 100 MCG/2ML IJ SOLN
25.0000 ug | INTRAMUSCULAR | Status: DC | PRN
  Administered 2016-05-29 (×2): 50 ug via INTRAVENOUS

## 2016-05-29 MED ORDER — LIDOCAINE HCL (CARDIAC) 20 MG/ML IV SOLN
INTRAVENOUS | Status: DC | PRN
  Administered 2016-05-29: 100 mg via INTRAVENOUS

## 2016-05-29 MED ORDER — SUCCINYLCHOLINE CHLORIDE 20 MG/ML IJ SOLN
INTRAMUSCULAR | Status: DC | PRN
  Administered 2016-05-29: 120 mg via INTRAVENOUS

## 2016-05-29 MED ORDER — MEPERIDINE HCL 25 MG/ML IJ SOLN: 6.2500 mg | INTRAMUSCULAR | Status: DC | PRN

## 2016-05-29 MED ORDER — FENTANYL CITRATE (PF) 100 MCG/2ML IJ SOLN
INTRAMUSCULAR | Status: DC | PRN
  Administered 2016-05-29 (×4): 50 ug via INTRAVENOUS

## 2016-05-29 MED ORDER — METOCLOPRAMIDE HCL 5 MG/ML IJ SOLN: 10.0000 mg | Freq: Once | INTRAMUSCULAR | Status: DC | PRN

## 2016-05-29 MED ORDER — ONDANSETRON HCL 4 MG/2ML IJ SOLN
INTRAMUSCULAR | Status: DC | PRN
  Administered 2016-05-29: 4 mg via INTRAVENOUS

## 2016-05-29 MED ORDER — PROPOFOL 10 MG/ML IV BOLUS
INTRAVENOUS | Status: AC
  Filled 2016-05-29: qty 40

## 2016-05-29 MED ORDER — HYDROCODONE-ACETAMINOPHEN 5-325 MG PO TABS: 1.0000 | ORAL_TABLET | Freq: Four times a day (QID) | ORAL | 0 refills | Status: DC | PRN

## 2016-05-29 MED ORDER — MIDAZOLAM HCL 2 MG/2ML IJ SOLN
INTRAMUSCULAR | Status: AC
  Filled 2016-05-29: qty 2

## 2016-05-29 MED ORDER — LACTATED RINGERS IV SOLN
INTRAVENOUS | Status: DC
  Administered 2016-05-29: 07:00:00 via INTRAVENOUS

## 2016-05-29 MED ORDER — LACTATED RINGERS IV SOLN: INTRAVENOUS | Status: DC

## 2016-05-29 MED ORDER — FENTANYL CITRATE (PF) 100 MCG/2ML IJ SOLN
INTRAMUSCULAR | Status: AC
  Filled 2016-05-29: qty 2

## 2016-05-29 MED ORDER — PROPOFOL 10 MG/ML IV BOLUS
INTRAVENOUS | Status: AC
  Filled 2016-05-29: qty 20

## 2016-05-29 MED ORDER — PROPOFOL 10 MG/ML IV BOLUS
INTRAVENOUS | Status: DC | PRN
  Administered 2016-05-29: 30 mg via INTRAVENOUS
  Administered 2016-05-29: 200 mg via INTRAVENOUS
  Administered 2016-05-29: 30 mg via INTRAVENOUS

## 2016-05-29 MED ORDER — FENTANYL CITRATE (PF) 100 MCG/2ML IJ SOLN
INTRAMUSCULAR | Status: AC
  Filled 2016-05-29: qty 4

## 2016-05-29 SURGICAL SUPPLY — 26 items
BUR CROSS CUT FISSURE 1.6 (BURR) IMPLANT
BUR CROSS CUT FISSURE 1.6MM (BURR)
BUR EGG ELITE 4.0 (BURR) IMPLANT
BUR EGG ELITE 4.0MM (BURR)
CANISTER SUCTION 2500CC (MISCELLANEOUS) ×3 IMPLANT
COVER SURGICAL LIGHT HANDLE (MISCELLANEOUS) ×3 IMPLANT
CRADLE DONUT ADULT HEAD (MISCELLANEOUS) ×3 IMPLANT
DECANTER SPIKE VIAL GLASS SM (MISCELLANEOUS) ×3 IMPLANT
GAUZE PACKING FOLDED 2  STR (GAUZE/BANDAGES/DRESSINGS) ×2
GAUZE PACKING FOLDED 2 STR (GAUZE/BANDAGES/DRESSINGS) ×1 IMPLANT
GLOVE SURG SS PI 6.5 STRL IVOR (GLOVE) ×3 IMPLANT
GLOVE SURG SS PI 7.5 STRL IVOR (GLOVE) ×3 IMPLANT
GOWN STRL REUS W/ TWL LRG LVL3 (GOWN DISPOSABLE) ×1 IMPLANT
GOWN STRL REUS W/ TWL XL LVL3 (GOWN DISPOSABLE) ×1 IMPLANT
GOWN STRL REUS W/TWL LRG LVL3 (GOWN DISPOSABLE) ×2
GOWN STRL REUS W/TWL XL LVL3 (GOWN DISPOSABLE) ×2
KIT BASIN OR (CUSTOM PROCEDURE TRAY) ×3 IMPLANT
KIT ROOM TURNOVER OR (KITS) ×3 IMPLANT
NEEDLE 22X1 1/2 (OR ONLY) (NEEDLE) ×6 IMPLANT
NS IRRIG 1000ML POUR BTL (IV SOLUTION) ×3 IMPLANT
PAD ARMBOARD 7.5X6 YLW CONV (MISCELLANEOUS) ×3 IMPLANT
SUT CHROMIC 3 0 PS 2 (SUTURE) ×6 IMPLANT
SYR CONTROL 10ML LL (SYRINGE) ×3 IMPLANT
TOWEL OR 17X26 10 PK STRL BLUE (TOWEL DISPOSABLE) ×3 IMPLANT
TRAY ENT MC OR (CUSTOM PROCEDURE TRAY) ×3 IMPLANT
YANKAUER SUCT BULB TIP NO VENT (SUCTIONS) ×3 IMPLANT

## 2016-05-29 NOTE — Op Note (Signed)
05/29/2016  9:39 AM  PATIENT:  [name]  40 y.o. female  PRE-OPERATIVE DIAGNOSIS:  NON RESTORABLE TEETH # 4, 8, 9, 12  POST-OPERATIVE DIAGNOSIS:  SAME  PROCEDURE:  Procedure(s): MULTIPLE EXTRACTIONS Teeth # 4, 8, 9, 12 SURGEON:  Surgeon(s): Ocie DoyneScott Ahliya Glatt, DDS  ANESTHESIA:   local and general  EBL:  minimal  DRAINS: none   SPECIMEN:  No Specimen  COUNTS:  YES  PLAN OF CARE: Discharge to home after PACU  PATIENT DISPOSITION:  PACU - hemodynamically stable.   PROCEDURE DETAILS: Dictation # No confirmation number given. Dictation complete at 9:42  Georgia LopesScott M. Jamerion Cabello, DMD 05/29/2016 9:39 AM

## 2016-05-29 NOTE — Transfer of Care (Signed)
Immediate Anesthesia Transfer of Care Note  Patient: Judith RankinsStacey N Derasmo  Procedure(s) Performed: Procedure(s): MULTIPLE EXTRACTIONS (N/A)  Patient Location: PACU  Anesthesia Type:General  Level of Consciousness: awake, alert  and oriented  Airway & Oxygen Therapy: Patient Spontanous Breathing and Patient connected to nasal cannula oxygen  Post-op Assessment: Report given to RN and Post -op Vital signs reviewed and stable  Post vital signs: Reviewed and stable  Last Vitals:  Vitals:   05/29/16 0648  BP: (!) 155/71  Pulse: 89  Resp: 20  Temp: 36.7 C    Last Pain:  Vitals:   05/29/16 0713  TempSrc:   PainSc: 0-No pain         Complications: No apparent anesthesia complications

## 2016-05-29 NOTE — H&P (Signed)
H&P documentation  -History and Physical Reviewed  -Patient has been re-examined  -No change in the plan of care  Cherylyn Sundby M  

## 2016-05-29 NOTE — Anesthesia Postprocedure Evaluation (Signed)
Anesthesia Post Note  Patient: [name]  Procedure(s) Performed: Procedure(s) (LRB): MULTIPLE EXTRACTIONS (N/A)  Patient location during evaluation: PACU Anesthesia Type: General Level of consciousness: awake and alert Pain management: pain level controlled Vital Signs Assessment: post-procedure vital signs reviewed and stable Respiratory status: spontaneous breathing, nonlabored ventilation, respiratory function stable and patient connected to nasal cannula oxygen Cardiovascular status: blood pressure returned to baseline and stable Postop Assessment: no signs of nausea or vomiting Anesthetic complications: no    Last Vitals:  Vitals:   05/29/16 1030 05/29/16 1036  BP: 106/65 133/85  Pulse: 82 77  Resp: 15 16  Temp: 37.1 C     Last Pain:  Vitals:   05/29/16 1036  TempSrc:   PainSc: 0-No pain                 Phillips Groutarignan, Debroh Sieloff

## 2016-05-29 NOTE — Anesthesia Preprocedure Evaluation (Signed)
Anesthesia Evaluation  Patient identified by MRN, date of birth, ID band Patient awake    Reviewed: Allergy & Precautions, NPO status , Patient's Chart, lab work & pertinent test results  Airway Mallampati: II  TM Distance: >3 FB Neck ROM: Full    Dental no notable dental hx. (+) Poor Dentition   Pulmonary asthma , Current Smoker,    Pulmonary exam normal breath sounds clear to auscultation       Cardiovascular hypertension, Pt. on medications Normal cardiovascular exam Rhythm:Regular Rate:Normal     Neuro/Psych negative neurological ROS  negative psych ROS   GI/Hepatic Neg liver ROS, GERD  Medicated and Controlled,  Endo/Other  Morbid obesity  Renal/GU negative Renal ROS  negative genitourinary   Musculoskeletal negative musculoskeletal ROS (+)   Abdominal   Peds negative pediatric ROS (+)  Hematology negative hematology ROS (+)   Anesthesia Other Findings   Reproductive/Obstetrics negative OB ROS                             Anesthesia Physical Anesthesia Plan  ASA: II  Anesthesia Plan: General   Post-op Pain Management:    Induction: Intravenous  Airway Management Planned: Nasal ETT  Additional Equipment:   Intra-op Plan:   Post-operative Plan: Extubation in OR  Informed Consent: I have reviewed the patients History and Physical, chart, labs and discussed the procedure including the risks, benefits and alternatives for the proposed anesthesia with the patient or authorized representative who has indicated his/her understanding and acceptance.   Dental advisory given  Plan Discussed with: CRNA  Anesthesia Plan Comments:         Anesthesia Quick Evaluation

## 2016-05-29 NOTE — Anesthesia Procedure Notes (Signed)
Procedure Name: Intubation Date/Time: 05/29/2016 9:21 AM Performed by: Jed LimerickHARDER, Nakhia Levitan S Pre-anesthesia Checklist: Patient identified, Emergency Drugs available, Suction available, Patient being monitored and Timeout performed Patient Re-evaluated:Patient Re-evaluated prior to inductionOxygen Delivery Method: Circle system utilized Preoxygenation: Pre-oxygenation with 100% oxygen Intubation Type: IV induction and Rapid sequence Ventilation: Mask ventilation without difficulty Laryngoscope Size: Glidescope and 3 Grade View: Grade I Tube type: Oral Tube size: 7.5 mm Number of attempts: 1 Airway Equipment and Method: Stylet and Video-laryngoscopy Secured at: 21 cm Tube secured with: Tape Dental Injury: Teeth and Oropharynx as per pre-operative assessment

## 2016-05-30 ENCOUNTER — Encounter (HOSPITAL_COMMUNITY): Payer: Self-pay | Admitting: Oral Surgery

## 2016-05-30 NOTE — Op Note (Signed)
NAMLurline Hare:  Provencal, Zawadi                ACCOUNT NO.:  [id number]  MEDICAL RECORD NO.:  [id number]  LOCATION:  [facility]                         FACILITY:  [facility]  PHYSICIAN:  Georgia LopesScott M. Renie Stelmach, M.D.  DATE OF BIRTH:  09-Jan-1976  DATE OF PROCEDURE:  05/29/2016 DATE OF DISCHARGE:  05/29/2016                              OPERATIVE REPORT   PREOPERATIVE DIAGNOSIS:  Nonrestorable teeth numbers 4, 8, 9, 12 secondary to dental caries.  POSTOPERATIVE DIAGNOSIS:  Nonrestorable teeth numbers 4, 8, 9, 12 secondary to dental caries.  PROCEDURE:  Extraction of teeth numbers 4, 8, 9, 12.  SURGEON:  Georgia LopesScott M. Maysen Bonsignore, M.D.  ANESTHESIA:  General, oral intubation.  DESCRIPTION OF PROCEDURE:  The patient was taken to the operating room and placed on the table in a supine position.  General anesthesia was administered intravenously and an oral endotracheal tube was placed and secured.  The eyes were protected.  The patient was draped for the procedure.  Time-out was performed.  The posterior pharynx was suctioned.  A throat pack was placed.  A 2% lidocaine was administered buccally and palatally around teeth numbers 4, 8, 9, and 12, a total of 10 mL was utilized.  A bite block was placed in the right side of the mouth and the left side was operated first.  A 15 blade used to make an incision around teeth numbers 12, 8, and 9.  The periosteum was reflected around these teeth with a periosteal elevator.  The teeth were elevated with a 301 elevator and removed from the mouth with a dental forceps #150 and the sockets were curetted.  The suture was placed between 8 and 9 using 3-0 chromic.  Then the endotracheal tube was repositioned in the other side of the mouth.  The bite block was placed in the left side of the mouth and then incision was made around tooth #4, both buccally and palatally.  The periosteum was reflected with a periosteal elevator.  The tooth was elevated with a 301 elevator and then using the  #150 forceps, the tooth was removed.  The socket was curetted.  Then, all areas were irrigated.  The oral cavity was irrigated and suctioned.  The throat pack was removed.  The patient was left in the care of Anesthesia for transportation to recovery room.  ESTIMATED BLOOD LOSS:  Minimal.  COMPLICATIONS:  None.  SPECIMENS:  None.     Georgia LopesScott M. Isaih Bulger, M.D.     SMJ/MEDQ  D:  05/29/2016  T:  05/30/2016  Job:  [id number]

## 2017-01-26 ENCOUNTER — Other Ambulatory Visit (HOSPITAL_COMMUNITY): Payer: Self-pay | Admitting: Family Medicine

## 2017-01-26 ENCOUNTER — Ambulatory Visit (HOSPITAL_COMMUNITY)
Admission: RE | Admit: 2017-01-26 | Discharge: 2017-01-26 | Disposition: A | Discharge status: Home / Self Care | Payer: Medicaid Other | Source: Ambulatory Visit | Attending: Family Medicine | Admitting: Family Medicine

## 2017-01-26 DIAGNOSIS — M545 Low back pain: Secondary | ICD-10-CM | POA: Diagnosis not present

## 2017-01-26 DIAGNOSIS — M546 Pain in thoracic spine: Secondary | ICD-10-CM

## 2017-02-28 ENCOUNTER — Emergency Department (HOSPITAL_COMMUNITY): Payer: Self-pay

## 2017-02-28 ENCOUNTER — Emergency Department (HOSPITAL_COMMUNITY)
Admission: EM | Admit: 2017-02-28 | Discharge: 2017-02-28 | Disposition: A | Discharge status: Home / Self Care | Payer: Self-pay | Attending: Emergency Medicine | Admitting: Emergency Medicine

## 2017-02-28 ENCOUNTER — Encounter (HOSPITAL_COMMUNITY): Payer: Self-pay | Admitting: Emergency Medicine

## 2017-02-28 DIAGNOSIS — F17210 Nicotine dependence, cigarettes, uncomplicated: Secondary | ICD-10-CM | POA: Insufficient documentation

## 2017-02-28 DIAGNOSIS — Z79899 Other long term (current) drug therapy: Secondary | ICD-10-CM | POA: Insufficient documentation

## 2017-02-28 DIAGNOSIS — R072 Precordial pain: Secondary | ICD-10-CM | POA: Insufficient documentation

## 2017-02-28 DIAGNOSIS — J45909 Unspecified asthma, uncomplicated: Secondary | ICD-10-CM | POA: Insufficient documentation

## 2017-02-28 DIAGNOSIS — I10 Essential (primary) hypertension: Secondary | ICD-10-CM | POA: Insufficient documentation

## 2017-02-28 LAB — COMPREHENSIVE METABOLIC PANEL
ALT: 11 U/L — ABNORMAL LOW (ref 14–54)
ANION GAP: 8 (ref 5–15)
AST: 16 U/L (ref 15–41)
Albumin: 3.6 g/dL (ref 3.5–5.0)
Alkaline Phosphatase: 84 U/L (ref 38–126)
BUN: 12 mg/dL (ref 6–20)
CALCIUM: 8.5 mg/dL — AB (ref 8.9–10.3)
CHLORIDE: 107 mmol/L (ref 101–111)
CO2: 25 mmol/L (ref 22–32)
Creatinine, Ser: 1.36 mg/dL — ABNORMAL HIGH (ref 0.44–1.00)
GFR, EST AFRICAN AMERICAN: 56 mL/min — AB (ref >60)
GFR, EST NON AFRICAN AMERICAN: 48 mL/min — AB (ref >60)
Glucose, Bld: 136 mg/dL — ABNORMAL HIGH (ref 65–99)
Potassium: 3.2 mmol/L — ABNORMAL LOW (ref 3.5–5.1)
SODIUM: 140 mmol/L (ref 135–145)
Total Bilirubin: 0.4 mg/dL (ref 0.3–1.2)
Total Protein: 6.4 g/dL — ABNORMAL LOW (ref 6.5–8.1)

## 2017-02-28 LAB — D-DIMER, QUANTITATIVE (NOT AT ARMC)

## 2017-02-28 LAB — CBC WITH DIFFERENTIAL/PLATELET
Basophils Absolute: 0 10*3/uL (ref 0.0–0.1)
Basophils Relative: 0 %
EOS ABS: 0.3 10*3/uL (ref 0.0–0.7)
EOS PCT: 3 %
HCT: 40.6 % (ref 36.0–46.0)
Hemoglobin: 13.9 g/dL (ref 12.0–15.0)
LYMPHS ABS: 2.9 10*3/uL (ref 0.7–4.0)
LYMPHS PCT: 27 %
MCH: 30.8 pg (ref 26.0–34.0)
MCHC: 34.2 g/dL (ref 30.0–36.0)
MCV: 89.8 fL (ref 78.0–100.0)
MONO ABS: 1.2 10*3/uL — AB (ref 0.1–1.0)
MONOS PCT: 11 %
Neutro Abs: 6.3 10*3/uL (ref 1.7–7.7)
Neutrophils Relative %: 59 %
PLATELETS: 189 10*3/uL (ref 150–400)
RBC: 4.52 MIL/uL (ref 3.87–5.11)
RDW: 13.5 % (ref 11.5–15.5)
WBC: 10.6 10*3/uL — ABNORMAL HIGH (ref 4.0–10.5)

## 2017-02-28 LAB — TROPONIN I

## 2017-02-28 MED ORDER — IBUPROFEN 600 MG PO TABS: 600.0000 mg | ORAL_TABLET | Freq: Four times a day (QID) | ORAL | 0 refills | Status: DC | PRN

## 2017-02-28 MED ORDER — KETOROLAC TROMETHAMINE 30 MG/ML IJ SOLN
30.0000 mg | Freq: Once | INTRAMUSCULAR | Status: AC
  Administered 2017-02-28: 30 mg via INTRAVENOUS
  Filled 2017-02-28: qty 1

## 2017-02-28 NOTE — ED Provider Notes (Signed)
[facility] DEPT Provider Note   CSN: [id number] Arrival date & time: 02/28/17  2156     History   Chief Complaint Chief Complaint  Patient presents with  . Chest Pain    HPI [name] is a 41 y.o. female.  HPI  The pt is a very obese 41 y/o female who presents with CP which is radiation to the back - seems to be constant and has been worse with occasional coughing - she has no hx of DM or known heart disease and has not been travelling, is not on OCP's and has no hx of leg swelling or immobilizations or CA - she has had pain in the RUQ as well - not worsened with eating.  She has recalled that she had a pain in her chest starting 4 days ago which she felt was a pulled muscle b/c it was made worse with moving her arm and chest - this has progressed to become constant for 2 days - she has chronic SOB but denies fevers or any significant coughing (just occasional).  She has had no meds prior to arrival.  She does smoke cigarrettes daily.  Past Medical History:  Diagnosis Date  . Asthma   . Bipolar disorder (HCC)   . Concussion   . Dyslipidemia (high LDL; low HDL) 03/27/2016  . Dyspnea    with exertion  . Elevated serum creatinine 03/27/2016  . Fibromyalgia   . GERD (gastroesophageal reflux disease)   . Headache(784.0)   . Hypertension   . Mental disorder    anxiety  . Pneumonia 2015  . Vitamin D deficiency 03/27/2016    Patient Active Problem List   Diagnosis Date Noted  . Vitamin D deficiency 03/27/2016  . Dyslipidemia (high LDL; low HDL) 03/27/2016  . Elevated serum creatinine 03/27/2016  . Cervical disc disorder with radiculopathy of cervical region 12/20/2014    Past Surgical History:  Procedure Laterality Date  . CYST REMOVAL HAND Left   . ESOPHAGEAL DILATION    . FOOT SURGERY Right    'knot removed"  . MULTIPLE EXTRACTIONS WITH ALVEOLOPLASTY N/A 05/29/2016   Procedure: MULTIPLE EXTRACTIONS;  Surgeon: Ocie Doyne, DDS;  Location: [facility] OR;  Service: Oral  Surgery;  Laterality: N/A;  . WISDOM TOOTH EXTRACTION      OB History    Gravida Para Term Preterm AB Living   3 1 1   2 1    SAB TAB Ectopic Multiple Live Births   2               Home Medications    Prior to Admission medications   Medication Sig Start Date End Date Taking? Authorizing Provider  albuterol (PROVENTIL HFA;VENTOLIN HFA) 108 (90 BASE) MCG/ACT inhaler Inhale 2 puffs into the lungs every 6 (six) hours as needed for wheezing or shortness of breath.    [provider]  albuterol-ipratropium (COMBIVENT) 18-103 MCG/ACT inhaler Inhale 2 puffs into the lungs every 6 (six) hours as needed (for asthmatic symptoms).     [provider]  atorvastatin (LIPITOR) 20 MG tablet Take 20 mg by mouth every evening.    [provider]  diphenhydrAMINE (BENADRYL) 25 mg capsule Take 25-50 mg by mouth every 6 (six) hours as needed (for allergies/sleep (50 mg for sleep)).    [provider]  fluticasone (FLONASE) 50 MCG/ACT nasal spray Place 2 sprays into both nostrils at bedtime as needed (for allergies/cold symptoms).    [provider]  hydrochlorothiazide (HYDRODIURIL) 25  MG tablet Take 25 mg by mouth daily.    [provider]  HYDROcodone-acetaminophen (NORCO) 5-325 MG tablet Take 1 tablet by mouth every 6 (six) hours as needed for moderate pain. 05/29/16   Ocie Doyne, DDS  ibuprofen (ADVIL,MOTRIN) 800 MG tablet Take 800 mg by mouth every 8 (eight) hours as needed.     [provider]  Menthol, Topical Analgesic, (BIOFREEZE) 4 % GEL Apply 1 application topically 4 (four) times daily as needed (for back pain.).    [provider]  Menthol-Camphor (ICY HOT ADVANCED RELIEF) 16-11 % CREA Apply 1 application topically 4 (four) times daily as needed (for pain).    [provider]  methocarbamol (ROBAXIN) 750 MG tablet Take 750 mg by mouth every 6 (six) hours as needed for muscle spasms. Typically take 3 times daily     [provider]  omeprazole (PRILOSEC) 40 MG capsule Take 40 mg by mouth 2 (two) times daily.     [provider]  ondansetron (ZOFRAN) 4 MG tablet Take 1 tablet (4 mg total) by mouth every 8 (eight) hours as needed for nausea or vomiting. 05/29/16   Ocie Doyne, DDS  phenazopyridine (URISTAT) 95 MG tablet Take 95 mg by mouth 3 (three) times daily as needed for pain.    [provider]  QUEtiapine (SEROQUEL) 25 MG tablet Take 25 mg by mouth at bedtime.    [provider]  topiramate (TOPAMAX) 50 MG tablet Take 50 mg by mouth every evening.     [provider]  Vitamin D, Ergocalciferol, (DRISDOL) 50000 units CAPS capsule Take 50,000 Units by mouth every Wednesday. Wednesdays     [provider]    Family History Family History  Problem Relation Age of Onset  . Cancer Maternal Grandmother        lung, back  . Diabetes Paternal Grandmother   . Heart disease Paternal Grandmother   . Diabetes Paternal Grandfather   . Heart disease Paternal Grandfather   . Stroke Paternal Grandfather     Social History Social History  Substance Use Topics  . Smoking status: Current Every Day Smoker    Packs/day: 0.50    Years: 10.00    Types: Cigarettes  . Smokeless tobacco: Never Used  . Alcohol use Yes     Comment: rare     Allergies   Penicillins; Sulfa antibiotics; Latex; and Other   Review of Systems Review of Systems  All other systems reviewed and are negative.    Physical Exam Updated Vital Signs BP 114/82   Pulse 90   Temp 97.9 F (36.6 C) (Oral)   Resp 19   Ht 5\' 6"  (1.676 m)   Wt (!) 154.2 kg (340 lb)   LMP 02/01/2017   SpO2 98%   BMI 54.88 kg/m   Physical Exam  Constitutional: She appears well-developed and well-nourished. No distress.  HENT:  Head: Normocephalic and atraumatic.  Mouth/Throat: Oropharynx is clear and moist. No oropharyngeal exudate.  Eyes: Pupils are equal, round, and reactive to light.  Conjunctivae and EOM are normal. Right eye exhibits no discharge. Left eye exhibits no discharge. No scleral icterus.  Neck: Normal range of motion. Neck supple. No JVD present. No thyromegaly present.  Cardiovascular: Normal rate, regular rhythm, normal heart sounds and intact distal pulses.  Exam reveals no gallop and no friction rub.   No murmur heard. Pulmonary/Chest: Effort normal and breath sounds normal. No respiratory distress. She has no wheezes. She has no  rales. She exhibits tenderness ( has ttp in the mid chest region to palpation - no pain with moving the arms.).  Lungs clear, no increased WOB  Abdominal: Soft. Bowel sounds are normal. She exhibits no distension and no mass. There is tenderness ( has RUQ ttp - no guarding and no murphy's sign).  Musculoskeletal: Normal range of motion. She exhibits no edema or tenderness.  No edema of the legs  Lymphadenopathy:    She has no cervical adenopathy.  Neurological: She is alert. Coordination normal.  Skin: Skin is warm and dry. No rash noted. No erythema.  Psychiatric: She has a normal mood and affect. Her behavior is normal.  Nursing note and vitals reviewed.    ED Treatments / Results  Labs (all labs ordered are listed, but only abnormal results are displayed) Labs Reviewed  CBC WITH DIFFERENTIAL/PLATELET  COMPREHENSIVE METABOLIC PANEL  D-DIMER, QUANTITATIVE (NOT AT [facility])  TROPONIN I    EKG  EKG Interpretation  Date/Time:  Sunday February 28 2017 22:05:00 EDT Ventricular Rate:  85 PR Interval:    QRS Duration: 117 QT Interval:  421 QTC Calculation: 501 R Axis:   79 Text Interpretation:  Sinus rhythm Borderline prolonged PR interval Nonspecific intraventricular conduction delay since last tracing no significant change Confirmed by Eber Hong ([id number]) on 02/28/2017 10:19:39 PM       Radiology No results found.  Procedures Procedures (including critical care time)  Medications Ordered in ED Medications    ketorolac (TORADOL) 30 MG/ML injection 30 mg (not administered)     Initial Impression / Assessment and Plan / ED Course  I have reviewed the triage vital signs and the nursing notes.  Pertinent labs & imaging results that were available during my care of the patient were reviewed by me and considered in my medical decision making (see chart for details).    The EKG is reassuring, the exam could be c/w ACS though this seems less likely given her constant pain with normal ECG - troponin pending. Being obese and relatively immobile as well as smoking raises concern for PE - d dimer sent. Could be lung pathology but O2 sat's normal snd lungs clear She does have reproducible pain to palpation - toradol ordered. Pt in agreement with plan of labs and xray.  D dimer and trop neg Labs unremarkable other than mild bump in the Cr Pt informed Needs recheck in next week Pt agreeable. CXR withotu acute fidnings  Final Clinical Impressions(s) / ED Diagnoses   Final diagnoses:  Precordial chest pain    New Prescriptions New Prescriptions   No medications on file     Eber Hong, MD 02/28/17 2316

## 2017-02-28 NOTE — ED Triage Notes (Signed)
Pt c/o chest pain that radiates to the back since Wednesday. Pt states she is always sob.

## 2017-02-28 NOTE — Discharge Instructions (Signed)
Your testing was overall very normal - your kidney test was very slightly abnormal and MUST be rechecked in next week - drink plenty of fluids and follow up with your doctor for recheck in next week!  Please obtain all of your results from medical records or have your doctors office obtain the results - share them with your doctor - you should be seen at your doctors office in the next 2 days. Call today to arrange your follow up. Take the medications as prescribed. Please review all of the medicines and only take them if you do not have an allergy to them. Please be aware that if you are taking birth control pills, taking other prescriptions, ESPECIALLY ANTIBIOTICS may make the birth control ineffective - if this is the case, either do not engage in sexual activity or use alternative methods of birth control such as condoms until you have finished the medicine and your family doctor says it is OK to restart them. If you are on a blood thinner such as COUMADIN, be aware that any other medicine that you take may cause the coumadin to either work too much, or not enough - you should have your coumadin level rechecked in next 7 days if this is the case.  ?  It is also a possibility that you have an allergic reaction to any of the medicines that you have been prescribed - Everybody reacts differently to medications and while MOST people have no trouble with most medicines, you may have a reaction such as nausea, vomiting, rash, swelling, shortness of breath. If this is the case, please stop taking the medicine immediately and contact your physician.  ?  You should return to the ER if you develop severe or worsening symptoms.

## 2017-05-22 ENCOUNTER — Encounter (HOSPITAL_COMMUNITY): Payer: Self-pay | Admitting: *Deleted

## 2017-05-22 ENCOUNTER — Emergency Department (HOSPITAL_COMMUNITY)
Admission: EM | Admit: 2017-05-22 | Discharge: 2017-05-24 | Disposition: A | Discharge status: Other Acute Inpatient Hospital | Payer: Self-pay | Attending: Emergency Medicine | Admitting: Emergency Medicine

## 2017-05-22 ENCOUNTER — Other Ambulatory Visit: Payer: Self-pay

## 2017-05-22 DIAGNOSIS — X789XXA Intentional self-harm by unspecified sharp object, initial encounter: Secondary | ICD-10-CM | POA: Insufficient documentation

## 2017-05-22 DIAGNOSIS — J45909 Unspecified asthma, uncomplicated: Secondary | ICD-10-CM | POA: Insufficient documentation

## 2017-05-22 DIAGNOSIS — Z79899 Other long term (current) drug therapy: Secondary | ICD-10-CM | POA: Insufficient documentation

## 2017-05-22 DIAGNOSIS — Y929 Unspecified place or not applicable: Secondary | ICD-10-CM | POA: Insufficient documentation

## 2017-05-22 DIAGNOSIS — Z91040 Latex allergy status: Secondary | ICD-10-CM | POA: Insufficient documentation

## 2017-05-22 DIAGNOSIS — Z23 Encounter for immunization: Secondary | ICD-10-CM | POA: Insufficient documentation

## 2017-05-22 DIAGNOSIS — F17210 Nicotine dependence, cigarettes, uncomplicated: Secondary | ICD-10-CM | POA: Insufficient documentation

## 2017-05-22 DIAGNOSIS — Y999 Unspecified external cause status: Secondary | ICD-10-CM | POA: Insufficient documentation

## 2017-05-22 DIAGNOSIS — M25512 Pain in left shoulder: Secondary | ICD-10-CM | POA: Insufficient documentation

## 2017-05-22 DIAGNOSIS — Z046 Encounter for general psychiatric examination, requested by authority: Secondary | ICD-10-CM | POA: Insufficient documentation

## 2017-05-22 DIAGNOSIS — F309 Manic episode, unspecified: Secondary | ICD-10-CM | POA: Insufficient documentation

## 2017-05-22 DIAGNOSIS — I10 Essential (primary) hypertension: Secondary | ICD-10-CM | POA: Insufficient documentation

## 2017-05-22 DIAGNOSIS — W19XXXA Unspecified fall, initial encounter: Secondary | ICD-10-CM | POA: Insufficient documentation

## 2017-05-22 DIAGNOSIS — S51812A Laceration without foreign body of left forearm, initial encounter: Secondary | ICD-10-CM | POA: Insufficient documentation

## 2017-05-22 DIAGNOSIS — Y9389 Activity, other specified: Secondary | ICD-10-CM | POA: Insufficient documentation

## 2017-05-22 LAB — CBC WITH DIFFERENTIAL/PLATELET
Basophils Absolute: 0 10*3/uL (ref 0.0–0.1)
Basophils Relative: 0 %
EOS ABS: 0.2 10*3/uL (ref 0.0–0.7)
Eosinophils Relative: 2 %
HCT: 39.6 % (ref 36.0–46.0)
HEMOGLOBIN: 13.1 g/dL (ref 12.0–15.0)
LYMPHS ABS: 1.5 10*3/uL (ref 0.7–4.0)
Lymphocytes Relative: 15 %
MCH: 30 pg (ref 26.0–34.0)
MCHC: 33.1 g/dL (ref 30.0–36.0)
MCV: 90.6 fL (ref 78.0–100.0)
Monocytes Absolute: 0.8 10*3/uL (ref 0.1–1.0)
Monocytes Relative: 8 %
NEUTROS PCT: 75 %
Neutro Abs: 7.3 10*3/uL (ref 1.7–7.7)
Platelets: 225 10*3/uL (ref 150–400)
RBC: 4.37 MIL/uL (ref 3.87–5.11)
RDW: 13.6 % (ref 11.5–15.5)
WBC: 9.7 10*3/uL (ref 4.0–10.5)

## 2017-05-22 LAB — BASIC METABOLIC PANEL
ANION GAP: 9 (ref 5–15)
BUN: 11 mg/dL (ref 6–20)
CALCIUM: 9.1 mg/dL (ref 8.9–10.3)
CHLORIDE: 103 mmol/L (ref 101–111)
CO2: 26 mmol/L (ref 22–32)
Creatinine, Ser: 1.23 mg/dL — ABNORMAL HIGH (ref 0.44–1.00)
GFR calc non Af Amer: 54 mL/min — ABNORMAL LOW (ref >60)
Glucose, Bld: 110 mg/dL — ABNORMAL HIGH (ref 65–99)
Potassium: 2.7 mmol/L — CL (ref 3.5–5.1)
Sodium: 138 mmol/L (ref 135–145)

## 2017-05-22 LAB — ETHANOL: Alcohol, Ethyl (B): <10 mg/dL (ref <10)

## 2017-05-22 LAB — ACETAMINOPHEN LEVEL

## 2017-05-22 LAB — SALICYLATE LEVEL

## 2017-05-22 MED ORDER — LIDOCAINE-EPINEPHRINE (PF) 2 %-1:200000 IJ SOLN
20.0000 mL | Freq: Once | INTRAMUSCULAR | Status: AC
  Administered 2017-05-22: 20 mL
  Filled 2017-05-22: qty 20

## 2017-05-22 MED ORDER — TETANUS-DIPHTH-ACELL PERTUSSIS 5-2.5-18.5 LF-MCG/0.5 IM SUSP
0.5000 mL | Freq: Once | INTRAMUSCULAR | Status: AC
  Administered 2017-05-22: 0.5 mL via INTRAMUSCULAR
  Filled 2017-05-22: qty 0.5

## 2017-05-22 MED ORDER — POTASSIUM CHLORIDE CRYS ER 20 MEQ PO TBCR
40.0000 meq | EXTENDED_RELEASE_TABLET | Freq: Once | ORAL | Status: AC
  Administered 2017-05-23: 40 meq via ORAL
  Filled 2017-05-22: qty 2

## 2017-05-22 NOTE — ED Provider Notes (Signed)
[name] is a 41 y.o. female who presents for medical clearance under IVC, reports she has been "sad and manic". Pt made 3 horizontal lacerations to her left wrist in a suicide attempt pt states "I did it so I would not have to go to behavioral health". Please see Dr. Vernard GamblesMcManus's note for full evaluation. Pt complaining of some pain in her left shoulder, reports she fell and hit her shoulder, while having a verbal altercation with her brother today about going to behavioral health. Denies hitting anything else, no head trauma or LOC.  Physical Exam  Pulse 98   SpO2 96%   Physical Exam  Constitutional: She appears well-developed and well-nourished.  HENT:  Head: Normocephalic and atraumatic.  Eyes: Right eye exhibits no discharge. Left eye exhibits no discharge.  Pulmonary/Chest: No respiratory distress.  Musculoskeletal:  3 horizontal Lacerations to left distal forearm on the palmar aspect, the most distal is approximately 10 cm long and 2cm wide at its widest point. The second is approximately 11 cm long has a forked end creating two lacerations on the radial end, it is 4-5 cm wide at its widest point, there is visible subcutaneous fat, but no evidence of tendon injury. The 3rd and most proximal is very shallow and more of a scratch, approximately 10 cm long with no separation. Radial pulse is 2+ with good capillary refill. 5/5 grip strength, pt able to move all fingers  Neurological: She is alert. Coordination normal.  Skin: Skin is warm and dry. Capillary refill takes less than 2 seconds.  Nursing note and vitals reviewed.      ED Course  .Marland Kitchen.Laceration Repair Date/Time: 05/22/2017 10:29 PM Performed by: Dartha LodgeFord, Wilna Pennie N, PA-C Authorized by: Dartha LodgeFord,  Carmack N, PA-C   Consent:    Consent obtained:  Verbal   Consent given by:  Patient   Risks discussed:  Infection, need for additional repair, pain and poor cosmetic result   Alternatives discussed:  No treatment Anesthesia (see MAR for  exact dosages):    Anesthesia method:  Local infiltration   Local anesthetic:  Lidocaine 2% WITH epi Laceration details:    Location:  Shoulder/arm   Shoulder/arm location:  L lower arm   Length (cm):  24 Repair type:    Repair type:  Intermediate Exploration:    Hemostasis achieved with:  Epinephrine and direct pressure   Wound exploration: entire depth of wound probed and visualized     Wound extent: no foreign bodies/material noted and no tendon damage noted   Treatment:    Area cleansed with:  Saline   Amount of cleaning:  Extensive   Irrigation solution:  Sterile saline   Irrigation volume:  1L   Irrigation method:  Syringe and pressure wash Skin repair:    Repair method:  Sutures   Suture size:  4-0   Suture material:  Nylon   Suture technique:  Simple interrupted (Simple interupted and horizontal mattress)   Number of sutures:  27 Approximation:    Approximation:  Close Post-procedure details:    Dressing:  Antibiotic ointment and non-adherent dressing   Patient tolerance of procedure:  Tolerated well, no immediate complications     MDM  2 Horizontal lacerations to the wrist requiring repair. Pt is neurovascularly intact and there is no evidence of tendon damage. Pt tolerated laceration repair with no immediate complications. Good approximation of both lacerations. Wound covered in bacitracin and dressed with non-adherent dressing. Pt will remain in ED for TTS evaluation, please see  Dr. Vernard GamblesMcManus's note for full details.  Dr. Lynelle DoctorKnapp will follow up on shoulder XR results, and TTS recommendations.          Dartha LodgeFord, Filipe Greathouse N, PA-C 05/23/17 0115    Dartha LodgeFord, Manford Sprong N, PA-C 05/23/17 0118    Samuel JesterMcManus, Kathleen, DO 05/25/17 1006

## 2017-05-22 NOTE — ED Notes (Signed)
CRITICAL VALUE ALERT  Critical Value:  Potassium 2.7  Date & Time Notied:  05/22/17 2309  Provider Notified: Clarene DukeMcManus MD  Orders Received/Actions taken: none

## 2017-05-22 NOTE — ED Triage Notes (Signed)
Pt reports feeling "sad" and "manic" this week. Pt reports a new female living in her house that has affected her moods. Pt states that she talked with her counselor this week and told her that she thought her medication wasn't working. Pt reports talking non-stop this week and has had very little sleep. Pt states her brother threatened to send her back to behavioral health and she is terrified of that place. Pt told her brother that she would, "die before she went back to that place." Pt states she saw lights flashing outside of her home and she thought it was the police. Pt states someone came through the door (she thought it was the police) and she cut her left wrist with a small razor blade in attempt to kill herself.  Pt reports taking around 6-7 "peach" xanax pills before the police got to her house.

## 2017-05-22 NOTE — ED Provider Notes (Signed)
[facility] EMERGENCY DEPARTMENT Provider Note   CSN: [id number] Arrival date & time: 05/22/17  2132     History   Chief Complaint Chief Complaint  Patient presents with  . Medical Clearance    HPI [name] is a 41 y.o. female.  HPI  Pt was seen at 2200. Per Police, EMS, pt's family and pt report: Pt states she has been "sad" and "manic" this past week. Pt has been talking non-stop and has had very little sleep this past week. Tried to call her mental health provider because she felt her medication was not working. Pt's brother threatened to send her back to [facility] and pt told him she "would die before she went back to that place." Pt then cut her left wrist in [place]. Pt also took 6-7 "peach" xanax before Police got to her house. Pt states she is upset because there is a new female individual living in the house.    Td unk Past Medical History:  Diagnosis Date  . Asthma   . Bipolar disorder (HCC)   . Concussion   . Dyslipidemia (high LDL; low HDL) 03/27/2016  . Dyspnea    with exertion  . Elevated serum creatinine 03/27/2016  . Fibromyalgia   . GERD (gastroesophageal reflux disease)   . Headache(784.0)   . Hypertension   . Mental disorder    anxiety  . Pneumonia 2015  . Vitamin D deficiency 03/27/2016    Patient Active Problem List   Diagnosis Date Noted  . Vitamin D deficiency 03/27/2016  . Dyslipidemia (high LDL; low HDL) 03/27/2016  . Elevated serum creatinine 03/27/2016  . Cervical disc disorder with radiculopathy of cervical region 12/20/2014    Past Surgical History:  Procedure Laterality Date  . CYST REMOVAL HAND Left   . ESOPHAGEAL DILATION    . FOOT SURGERY Right    'knot removed"  . WISDOM TOOTH EXTRACTION      OB History    Gravida Para Term Preterm AB Living   3 1 1   2 1    SAB TAB Ectopic Multiple Live Births   2               Home Medications    Prior to Admission medications   Medication Sig Start Date End Date Taking? Authorizing  Provider  albuterol (PROVENTIL HFA;VENTOLIN HFA) 108 (90 BASE) MCG/ACT inhaler Inhale 2 puffs into the lungs every 6 (six) hours as needed for wheezing or shortness of breath.   Yes [provider]  atorvastatin (LIPITOR) 20 MG tablet Take 20 mg by mouth every evening.   Yes [provider]  diphenhydrAMINE (BENADRYL) 25 mg capsule Take 50 mg at bedtime by mouth.    Yes [provider]  fluticasone (FLONASE) 50 MCG/ACT nasal spray Place 2 sprays into both nostrils at bedtime as needed (for allergies/cold symptoms).   Yes [provider]  gabapentin (NEURONTIN) 300 MG capsule Take 300 mg 3 (three) times daily by mouth.   Yes [provider]  hydrochlorothiazide (HYDRODIURIL) 25 MG tablet Take 25 mg by mouth daily.   Yes [provider]  ibuprofen (ADVIL,MOTRIN) 600 MG tablet Take 1 tablet (600 mg total) by mouth every 6 (six) hours as needed. Patient taking differently: Take 600 mg 3 (three) times daily by mouth.  02/28/17  Yes Eber Hong, MD  Liniments [facility] ARTHRITIS PAIN RELIEF EX) Apply 1 application daily as needed topically.   Yes [provider]  Menthol, Topical Analgesic, (  BIOFREEZE) 4 % GEL Apply 1 application topically 4 (four) times daily as needed (for back pain.).   Yes [provider]  Menthol-Camphor (ICY HOT ADVANCED RELIEF) 16-11 % CREA Apply 1 application topically 4 (four) times daily as needed (for pain).   Yes [provider]  methocarbamol (ROBAXIN) 750 MG tablet Take 750 mg 4 (four) times daily by mouth.    Yes [provider]  omeprazole (PRILOSEC) 40 MG capsule Take 40 mg by mouth 2 (two) times daily.    Yes [provider]  QUEtiapine (SEROQUEL) 100 MG tablet Take 100 mg at bedtime by mouth.    Yes [provider]  topiramate (TOPAMAX) 50 MG tablet Take 50 mg by mouth every evening.    Yes [provider]  Vitamin D, Ergocalciferol, (DRISDOL) 50000 units  CAPS capsule Take 50,000 Units by mouth every Wednesday. Wednesdays    Yes [provider]    Family History Family History  Problem Relation Age of Onset  . Cancer Maternal Grandmother        lung, back  . Diabetes Paternal Grandmother   . Heart disease Paternal Grandmother   . Diabetes Paternal Grandfather   . Heart disease Paternal Grandfather   . Stroke Paternal Grandfather     Social History Social History   Tobacco Use  . Smoking status: Current Every Day Smoker    Packs/day: 0.50    Years: 10.00    Pack years: 5.00    Types: Cigarettes  . Smokeless tobacco: Never Used  Substance Use Topics  . Alcohol use: Yes    Comment: rare  . Drug use: No     Allergies   Penicillins; Sulfa antibiotics; Latex; and Other   Review of Systems Review of Systems ROS: Statement: All systems negative except as marked or noted in the HPI; Constitutional: Negative for fever and chills. ; ; Eyes: Negative for eye pain, redness and discharge. ; ; ENMT: Negative for ear pain, hoarseness, nasal congestion, sinus pressure and sore throat. ; ; Cardiovascular: Negative for chest pain, palpitations, diaphoresis, dyspnea and peripheral edema. ; ; Respiratory: Negative for cough, wheezing and stridor. ; ; Gastrointestinal: Negative for nausea, vomiting, diarrhea, abdominal pain, blood in stool, hematemesis, jaundice and rectal bleeding. . ; ; Genitourinary: Negative for dysuria, flank pain and hematuria. ; ; Musculoskeletal: Negative for back pain and neck pain. Negative for swelling and trauma.; ; Skin: +laceration. Negative for pruritus, rash, abrasions, blisters, bruising and skin lesion.; ; Neuro: Negative for headache, lightheadedness and neck stiffness. Negative for weakness, altered level of consciousness, altered mental status, extremity weakness, paresthesias, involuntary movement, seizure and syncope.       Physical Exam Updated Vital Signs Pulse 98   SpO2 96%   Physical  Exam 2205: Physical examination:  Nursing notes reviewed; Vital signs and O2 SAT reviewed;  Constitutional: Well developed, Well nourished, Well hydrated, In no acute distress; Head:  Normocephalic, atraumatic; Eyes: EOMI, PERRL, No scleral icterus; ENMT: Mouth and pharynx normal, Mucous membranes moist; Neck: Supple, Full range of motion, No lymphadenopathy; Cardiovascular: Regular rate and rhythm, No gallop; Respiratory: Breath sounds clear & equal bilaterally, No wheezes.  Speaking full sentences with ease, Normal respiratory effort/excursion; Chest: Nontender, Movement normal; Abdomen: Soft, Nontender, Nondistended, Normal bowel sounds; Genitourinary: No CVA tenderness; Extremities: +lacs to left volar forearm. Pulses normal, No tenderness, No edema, No calf edema or asymmetry.; Neuro: AA&Ox3, Major CN grossly intact.  Speech slightly slurred. No gross focal motor or sensory  deficits in extremities.; Skin: Color normal, Warm, Dry.; Psych:  Rapid/pressured speech, tangential, rambling.     ED Treatments / Results  Labs (all labs ordered are listed, but only abnormal results are displayed)   EKG  EKG Interpretation None       Radiology   Procedures Procedures (including critical care time)  Medications Ordered in ED Medications  Tdap (BOOSTRIX) injection 0.5 mL (0.5 mLs Intramuscular Given 05/22/17 2239)  lidocaine-EPINEPHrine (XYLOCAINE W/EPI) 2 %-1:200000 (PF) injection 20 mL (20 mLs Infiltration Given 05/22/17 2241)     Initial Impression / Assessment and Plan / ED Course  I have reviewed the triage vital signs and the nursing notes.  Pertinent labs & imaging results that were available during my care of the patient were reviewed by me and considered in my medical decision making (see chart for details).  MDM Reviewed: previous chart, nursing note and vitals Reviewed previous: labs Interpretation: labs   Results for orders placed or performed during the hospital  encounter of 05/22/17  Basic metabolic panel  Result Value Ref Range   Sodium 138 135 - 145 mmol/L   Potassium 2.7 (LL) 3.5 - 5.1 mmol/L   Chloride 103 101 - 111 mmol/L   CO2 26 22 - 32 mmol/L   Glucose, Bld 110 (H) 65 - 99 mg/dL   BUN 11 6 - 20 mg/dL   Creatinine, Ser 7.821.23 (H) 0.44 - 1.00 mg/dL   Calcium 9.1 8.9 - 95.610.3 mg/dL   GFR calc non Af Amer 54 (L) >60 mL/min   GFR calc Af Amer >60 >60 mL/min   Anion gap 9 5 - 15  Ethanol  Result Value Ref Range   Alcohol, Ethyl (B) <10 <10 mg/dL  CBC with Differential  Result Value Ref Range   WBC 9.7 4.0 - 10.5 K/uL   RBC 4.37 3.87 - 5.11 MIL/uL   Hemoglobin 13.1 12.0 - 15.0 g/dL   HCT 21.339.6 08.636.0 - 57.846.0 %   MCV 90.6 78.0 - 100.0 fL   MCH 30.0 26.0 - 34.0 pg   MCHC 33.1 30.0 - 36.0 g/dL   RDW 46.913.6 62.911.5 - 52.815.5 %   Platelets 225 150 - 400 K/uL   Neutrophils Relative % 75 %   Neutro Abs 7.3 1.7 - 7.7 K/uL   Lymphocytes Relative 15 %   Lymphs Abs 1.5 0.7 - 4.0 K/uL   Monocytes Relative 8 %   Monocytes Absolute 0.8 0.1 - 1.0 K/uL   Eosinophils Relative 2 %   Eosinophils Absolute 0.2 0.0 - 0.7 K/uL   Basophils Relative 0 %   Basophils Absolute 0.0 0.0 - 0.1 K/uL  Salicylate level  Result Value Ref Range   Salicylate Lvl <7.0 2.8 - 30.0 mg/dL  Acetaminophen level  Result Value Ref Range   Acetaminophen (Tylenol), Serum <10 (L) 10 - 30 ug/mL    0005:  IVC paperwork completed on pt's arrival to ED. APP to close forearm lacs (see separate note). Will repeat APAP and ASA levels now. Potassium repleted PO. Will have TTS evaluate.    Final Clinical Impressions(s) / ED Diagnoses   Final diagnoses:  None    ED Discharge Orders    None       Samuel JesterMcManus, Luverta Korte, DO 05/23/17 0005

## 2017-05-22 NOTE — ED Notes (Signed)
ED Provider at bedside. 

## 2017-05-23 ENCOUNTER — Emergency Department (HOSPITAL_COMMUNITY): Payer: Self-pay

## 2017-05-23 LAB — BASIC METABOLIC PANEL
ANION GAP: 5 (ref 5–15)
BUN: 11 mg/dL (ref 6–20)
CO2: 26 mmol/L (ref 22–32)
Calcium: 8.1 mg/dL — ABNORMAL LOW (ref 8.9–10.3)
Chloride: 105 mmol/L (ref 101–111)
Creatinine, Ser: 1.33 mg/dL — ABNORMAL HIGH (ref 0.44–1.00)
GFR calc Af Amer: 57 mL/min — ABNORMAL LOW (ref >60)
GFR calc non Af Amer: 49 mL/min — ABNORMAL LOW (ref >60)
Glucose, Bld: 114 mg/dL — ABNORMAL HIGH (ref 65–99)
Potassium: 3.6 mmol/L (ref 3.5–5.1)
SODIUM: 136 mmol/L (ref 135–145)

## 2017-05-23 LAB — ACETAMINOPHEN LEVEL

## 2017-05-23 LAB — RAPID URINE DRUG SCREEN, HOSP PERFORMED
AMPHETAMINES: NOT DETECTED
BARBITURATES: NOT DETECTED
BENZODIAZEPINES: POSITIVE — AB
COCAINE: NOT DETECTED
Opiates: NOT DETECTED
Tetrahydrocannabinol: NOT DETECTED

## 2017-05-23 LAB — PREGNANCY, URINE: PREG TEST UR: NEGATIVE

## 2017-05-23 LAB — SALICYLATE LEVEL

## 2017-05-23 MED ORDER — MENTHOL (TOPICAL ANALGESIC) 4 % EX GEL: 1.0000 "application " | Freq: Four times a day (QID) | CUTANEOUS | Status: DC | PRN

## 2017-05-23 MED ORDER — QUETIAPINE FUMARATE 100 MG PO TABS
100.0000 mg | ORAL_TABLET | Freq: Every day | ORAL | Status: DC
  Filled 2017-05-23: qty 1

## 2017-05-23 MED ORDER — BACITRACIN ZINC 500 UNIT/GM EX OINT
TOPICAL_OINTMENT | Freq: Once | CUTANEOUS | Status: AC
  Administered 2017-05-23: 1 via TOPICAL
  Filled 2017-05-23: qty 1.8

## 2017-05-23 MED ORDER — ATORVASTATIN CALCIUM 10 MG PO TABS
20.0000 mg | ORAL_TABLET | Freq: Every evening | ORAL | Status: DC
  Administered 2017-05-23: 20 mg via ORAL
  Filled 2017-05-23 (×3): qty 2

## 2017-05-23 MED ORDER — GABAPENTIN 300 MG PO CAPS
300.0000 mg | ORAL_CAPSULE | Freq: Three times a day (TID) | ORAL | Status: DC
  Administered 2017-05-23 – 2017-05-24 (×4): 300 mg via ORAL
  Filled 2017-05-23 (×5): qty 1

## 2017-05-23 MED ORDER — TOPIRAMATE 25 MG PO TABS
50.0000 mg | ORAL_TABLET | Freq: Every evening | ORAL | Status: DC
  Filled 2017-05-23 (×3): qty 2

## 2017-05-23 MED ORDER — IBUPROFEN 800 MG PO TABS
800.0000 mg | ORAL_TABLET | Freq: Once | ORAL | Status: DC
  Filled 2017-05-23: qty 1

## 2017-05-23 MED ORDER — DIPHENHYDRAMINE HCL 25 MG PO CAPS
50.0000 mg | ORAL_CAPSULE | Freq: Every day | ORAL | Status: DC
  Filled 2017-05-23: qty 2

## 2017-05-23 MED ORDER — FLUTICASONE PROPIONATE 50 MCG/ACT NA SUSP
2.0000 | Freq: Every evening | NASAL | Status: DC | PRN
  Filled 2017-05-23: qty 16

## 2017-05-23 MED ORDER — POTASSIUM CHLORIDE CRYS ER 20 MEQ PO TBCR
40.0000 meq | EXTENDED_RELEASE_TABLET | Freq: Once | ORAL | Status: AC
  Administered 2017-05-23: 40 meq via ORAL
  Filled 2017-05-23: qty 2

## 2017-05-23 MED ORDER — HYDROCHLOROTHIAZIDE 25 MG PO TABS
25.0000 mg | ORAL_TABLET | Freq: Every day | ORAL | Status: DC
  Administered 2017-05-24: 25 mg via ORAL
  Filled 2017-05-23: qty 1

## 2017-05-23 MED ORDER — PANTOPRAZOLE SODIUM 40 MG PO TBEC
40.0000 mg | DELAYED_RELEASE_TABLET | Freq: Every day | ORAL | Status: DC
  Administered 2017-05-23 – 2017-05-24 (×2): 40 mg via ORAL
  Filled 2017-05-23 (×2): qty 1

## 2017-05-23 MED ORDER — IBUPROFEN 400 MG PO TABS
600.0000 mg | ORAL_TABLET | Freq: Four times a day (QID) | ORAL | Status: DC | PRN
  Administered 2017-05-23 – 2017-05-24 (×2): 600 mg via ORAL
  Filled 2017-05-23 (×3): qty 2

## 2017-05-23 NOTE — ED Notes (Signed)
Patient's dressing changed to left forearm, tolerated well. Dressing- telfa, abd pad, and kerlix. No active bleeding, sutures intact, no signs of infection.

## 2017-05-23 NOTE — ED Notes (Signed)
Patient transported to X-ray 

## 2017-05-23 NOTE — ED Provider Notes (Signed)
01:05 AM Ford, TTS has evaluated patient and recommends inpatient admission, [facility], will work on placement.   Dg Shoulder Left  Result Date: 05/23/2017 CLINICAL DATA:  Pain in the left shoulder EXAM: LEFT SHOULDER - 2+ VIEW COMPARISON:  None. FINDINGS: Minimal AC joint degenerative change. No fracture or malalignment. Left lung apex is clear IMPRESSION: No acute osseous abnormality. Electronically Signed   By: Jasmine PangKim  Fujinaga M.D.   On: 05/23/2017 02:15        Devoria AlbeKnapp, Antigone Crowell, MD 05/23/17 [phone]

## 2017-05-23 NOTE — Progress Notes (Signed)
TTS attempted to assess pt. TTS called cart and pt is currently receiving sutures by EDP. EDP requested 10 minutes to allow time to complete medical work and TTS will call back.  Princess BruinsAquicha Nixon Kolton, MSW, LCSW Therapeutic Triage Specialist  [phone]

## 2017-05-23 NOTE — Progress Notes (Signed)
LCSW following for:  Disposition: [name], [name], confirmed 500-unit is currently at capacity. Gave clinical report to Judith ConnJason Berry, [name] who said [name] meets criteria for inpatient psychiatric treatment. TTS will contact facilities for placement. Notified [name] of recommendation.    Patient has been referred to the following: [facility]  Will follow up on referrals.  Judith EmoryHannah Haydin Dunn LCSW, MSW Clinical Social Work: Optician, dispensingystem Wide Float Coverage for :  [phone]

## 2017-05-23 NOTE — Progress Notes (Signed)
TTS attempted to assess pt. Judith FiscalLori, RN states the pt is in the restroom and being changed into scrubs. TTS unable to assess pt at this time.  [name], MSW, LCSW Therapeutic Triage Specialist  [phone]

## 2017-05-23 NOTE — ED Notes (Signed)
EDP made aware of patient's blood pressure and potassium level. Orders given.

## 2017-05-23 NOTE — BH Assessment (Signed)
[facility] Assessment Progress Note    Patient was reassessed and she states, "I don't want to go back to the hospital, I will not make it in there, I don't need to be with a lot of crazy people."  Patient states that her brother was saying that she needed to be back in the hospital and she states that she attempted suicide because she did not want to have to [facility].  She states that her brother is dating a 36nineteen year old  And has moved him into the house and she states that he has disrupted her "safe place."  Patient continues to have mood lability and is tearful.  Explained to her that she would have to be admitted to the hospital because of the severity of her suicide attempt.  Staffed patient with Leighton Ruffina Okonkwo, FNP who was in agreement with the need for continued inpatient placement.

## 2017-05-23 NOTE — ED Notes (Signed)
Notified Dr. Clarene DukeMcManus of pt's BP of 85/49 and pt's sleepiness. Orders given to hold PM meds except for Lipitor. Orders carried out accordingly. Will continue to monitor.
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
## 2017-05-24 ENCOUNTER — Other Ambulatory Visit: Payer: Self-pay

## 2017-05-24 ENCOUNTER — Encounter (HOSPITAL_COMMUNITY): Payer: Self-pay | Admitting: *Deleted

## 2017-05-24 ENCOUNTER — Inpatient Hospital Stay (HOSPITAL_COMMUNITY)
Admission: AD | Admit: 2017-05-24 | Discharge: 2017-05-28 | DRG: 885 | Disposition: A | Discharge status: Home / Self Care | Payer: Federal, State, Local not specified - Other | Source: Other Acute Inpatient Hospital | Attending: Psychiatry | Admitting: Psychiatry

## 2017-05-24 DIAGNOSIS — Z91410 Personal history of adult physical and sexual abuse: Secondary | ICD-10-CM

## 2017-05-24 DIAGNOSIS — Z818 Family history of other mental and behavioral disorders: Secondary | ICD-10-CM | POA: Diagnosis not present

## 2017-05-24 DIAGNOSIS — F419 Anxiety disorder, unspecified: Secondary | ICD-10-CM | POA: Diagnosis present

## 2017-05-24 DIAGNOSIS — Z880 Allergy status to penicillin: Secondary | ICD-10-CM | POA: Diagnosis not present

## 2017-05-24 DIAGNOSIS — R4583 Excessive crying of child, adolescent or adult: Secondary | ICD-10-CM | POA: Diagnosis not present

## 2017-05-24 DIAGNOSIS — G4700 Insomnia, unspecified: Secondary | ICD-10-CM | POA: Diagnosis present

## 2017-05-24 DIAGNOSIS — J45909 Unspecified asthma, uncomplicated: Secondary | ICD-10-CM | POA: Diagnosis present

## 2017-05-24 DIAGNOSIS — F4010 Social phobia, unspecified: Secondary | ICD-10-CM | POA: Diagnosis present

## 2017-05-24 DIAGNOSIS — F17210 Nicotine dependence, cigarettes, uncomplicated: Secondary | ICD-10-CM | POA: Diagnosis present

## 2017-05-24 DIAGNOSIS — Z882 Allergy status to sulfonamides status: Secondary | ICD-10-CM

## 2017-05-24 DIAGNOSIS — Z91040 Latex allergy status: Secondary | ICD-10-CM

## 2017-05-24 DIAGNOSIS — F3163 Bipolar disorder, current episode mixed, severe, without psychotic features: Principal | ICD-10-CM | POA: Diagnosis present

## 2017-05-24 DIAGNOSIS — I959 Hypotension, unspecified: Secondary | ICD-10-CM | POA: Diagnosis present

## 2017-05-24 DIAGNOSIS — E785 Hyperlipidemia, unspecified: Secondary | ICD-10-CM | POA: Diagnosis present

## 2017-05-24 DIAGNOSIS — M797 Fibromyalgia: Secondary | ICD-10-CM | POA: Diagnosis present

## 2017-05-24 DIAGNOSIS — F3162 Bipolar disorder, current episode mixed, moderate: Secondary | ICD-10-CM | POA: Diagnosis not present

## 2017-05-24 DIAGNOSIS — R4586 Emotional lability: Secondary | ICD-10-CM | POA: Diagnosis not present

## 2017-05-24 DIAGNOSIS — K219 Gastro-esophageal reflux disease without esophagitis: Secondary | ICD-10-CM | POA: Diagnosis present

## 2017-05-24 DIAGNOSIS — Z23 Encounter for immunization: Secondary | ICD-10-CM | POA: Diagnosis not present

## 2017-05-24 DIAGNOSIS — F3130 Bipolar disorder, current episode depressed, mild or moderate severity, unspecified: Secondary | ICD-10-CM | POA: Diagnosis present

## 2017-05-24 DIAGNOSIS — I10 Essential (primary) hypertension: Secondary | ICD-10-CM | POA: Diagnosis present

## 2017-05-24 DIAGNOSIS — Z915 Personal history of self-harm: Secondary | ICD-10-CM | POA: Diagnosis not present

## 2017-05-24 DIAGNOSIS — Z79899 Other long term (current) drug therapy: Secondary | ICD-10-CM

## 2017-05-24 DIAGNOSIS — T424X2A Poisoning by benzodiazepines, intentional self-harm, initial encounter: Secondary | ICD-10-CM | POA: Diagnosis not present

## 2017-05-24 DIAGNOSIS — R944 Abnormal results of kidney function studies: Secondary | ICD-10-CM | POA: Diagnosis present

## 2017-05-24 DIAGNOSIS — F319 Bipolar disorder, unspecified: Secondary | ICD-10-CM | POA: Diagnosis present

## 2017-05-24 MED ORDER — DIPHENHYDRAMINE HCL 50 MG PO CAPS
50.0000 mg | ORAL_CAPSULE | Freq: Every day | ORAL | Status: DC
  Administered 2017-05-24 – 2017-05-27 (×4): 50 mg via ORAL
  Filled 2017-05-24 (×2): qty 1
  Filled 2017-05-24: qty 2
  Filled 2017-05-24: qty 1
  Filled 2017-05-24: qty 7
  Filled 2017-05-24 (×2): qty 1

## 2017-05-24 MED ORDER — ATORVASTATIN CALCIUM 20 MG PO TABS
20.0000 mg | ORAL_TABLET | Freq: Every evening | ORAL | Status: DC
  Administered 2017-05-25 – 2017-05-27 (×3): 20 mg via ORAL
  Filled 2017-05-24: qty 7
  Filled 2017-05-24 (×4): qty 1

## 2017-05-24 MED ORDER — TOPIRAMATE 25 MG PO TABS
50.0000 mg | ORAL_TABLET | Freq: Every evening | ORAL | Status: DC
  Filled 2017-05-24: qty 2

## 2017-05-24 MED ORDER — PNEUMOCOCCAL VAC POLYVALENT 25 MCG/0.5ML IJ INJ
0.5000 mL | INJECTION | INTRAMUSCULAR | Status: AC
  Administered 2017-05-25: 0.5 mL via INTRAMUSCULAR

## 2017-05-24 MED ORDER — INFLUENZA VAC SPLIT QUAD 0.5 ML IM SUSY
0.5000 mL | PREFILLED_SYRINGE | INTRAMUSCULAR | Status: AC
  Administered 2017-05-25: 0.5 mL via INTRAMUSCULAR
  Filled 2017-05-24: qty 0.5

## 2017-05-24 MED ORDER — HYDROCHLOROTHIAZIDE 25 MG PO TABS
25.0000 mg | ORAL_TABLET | Freq: Every day | ORAL | Status: DC
  Filled 2017-05-24 (×2): qty 1

## 2017-05-24 MED ORDER — PANTOPRAZOLE SODIUM 40 MG PO TBEC
40.0000 mg | DELAYED_RELEASE_TABLET | Freq: Every day | ORAL | Status: DC
  Administered 2017-05-25 – 2017-05-28 (×4): 40 mg via ORAL
  Filled 2017-05-24 (×2): qty 1
  Filled 2017-05-24: qty 7
  Filled 2017-05-24 (×4): qty 1

## 2017-05-24 MED ORDER — GABAPENTIN 300 MG PO CAPS
300.0000 mg | ORAL_CAPSULE | Freq: Three times a day (TID) | ORAL | Status: DC
Start: 2017-05-25 — End: 2017-05-28
  Administered 2017-05-25 – 2017-05-28 (×11): 300 mg via ORAL
  Filled 2017-05-24 (×2): qty 1
  Filled 2017-05-24: qty 21
  Filled 2017-05-24 (×6): qty 1
  Filled 2017-05-24: qty 21
  Filled 2017-05-24 (×3): qty 1
  Filled 2017-05-24: qty 21
  Filled 2017-05-24 (×3): qty 1

## 2017-05-24 MED ORDER — FLUTICASONE PROPIONATE 50 MCG/ACT NA SUSP: 2.0000 | Freq: Every evening | NASAL | Status: DC | PRN

## 2017-05-24 MED ORDER — IBUPROFEN 600 MG PO TABS: 600.0000 mg | ORAL_TABLET | Freq: Four times a day (QID) | ORAL | Status: DC | PRN

## 2017-05-24 MED ORDER — QUETIAPINE FUMARATE 100 MG PO TABS
100.0000 mg | ORAL_TABLET | Freq: Every day | ORAL | Status: DC
Start: 2017-05-24 — End: 2017-05-25
  Administered 2017-05-24: 100 mg via ORAL
  Filled 2017-05-24 (×3): qty 1

## 2017-05-24 NOTE — ED Notes (Signed)
TTS in progress 

## 2017-05-24 NOTE — Progress Notes (Signed)
Per [name] , [name], patient has been accepted to [name], bed 503-1 ; Accepting provider is Judith KaufmannLaura Davis, NP; Attending provider is Dr. Jama Flavorsobos.   Patient can arrive at 5:00pm. Number for report is [phone].   Judith NayJeff Sappelt, RN notified.   [name] MSW, LCSWA CSW Disposition [phone]

## 2017-05-24 NOTE — ED Notes (Signed)
Pt resting with eyes closed, appears to be in no distress. Respirations are even and unlabored. Sitter at bedside.  

## 2017-05-24 NOTE — ED Provider Notes (Signed)
Sleeping comfortably   Rodolfo Gaster, MD 05/24/17 0757  

## 2017-05-24 NOTE — Progress Notes (Signed)
Psychoeducational Group Note  Date:  05/24/2017 Time:  2328  Group Topic/Focus:  Wrap-Up Group:   The focus of this group is to help patients review their daily goal of treatment and discuss progress on daily workbooks.  Participation Level: Did Not Attend  Participation Quality:  Not Applicable  Affect:  Not Applicable  Cognitive:  Not Applicable  Insight:  Not Applicable  Engagement in Group: Not Applicable  Additional Comments:  The patient did not attend group this evening since she was not admitted to the hallway until later in the evening.   [name], [name] S 05/24/2017, 11:28 PM

## 2017-05-24 NOTE — Progress Notes (Signed)
[name] is a 41 year old female pt admitted on involuntary basis. On admission, she reports on-going stress at home with brother and reports that she has been feeling increasing anxious and suicidal and endorses that she attempted suicide by cutting her left forearm. She reports on-going financial stressors and that she recently lost [employer] insurance. She denies any substance abuse issues and reports the xanax that she overdosed was from an old prescription. She reports that she takes her medications as prescribed. She denies any SI on admission and is able to contract for safety while in the hospital. She reports that she lives with her brother and reports that she can go back there at discharge. [name] was oriented to the unit and safety maintained.

## 2017-05-24 NOTE — ED Notes (Signed)
Dressing to left wrist dry and intact.

## 2017-05-24 NOTE — ED Notes (Signed)
Pt given lunch tray at this time

## 2017-05-24 NOTE — Tx Team (Signed)
Initial Treatment Plan 05/24/2017 9:34 PM Judith RankinsStacey N Julin [id number]    PATIENT STRESSORS: Financial difficulties Marital or family conflict Other: loss of insurance   PATIENT STRENGTHS: Ability for insight Average or above average intelligence Communication skills General fund of knowledge   PATIENT IDENTIFIED PROBLEMS: Depression Anxiety Suicidal thoughts "Work on anxiety and depression"                     DISCHARGE CRITERIA:  Ability to meet basic life and health needs Improved stabilization in mood, thinking, and/or behavior Verbal commitment to aftercare and medication compliance  PRELIMINARY DISCHARGE PLAN: Attend aftercare/continuing care group Return to previous living arrangement  PATIENT/FAMILY INVOLVEMENT: This treatment plan has been presented to and reviewed with the patient, [name], and/or family member, .  The patient and family have been given the opportunity to ask questions and make suggestions.  Shelma Eiben, [place], [place] 05/24/2017, 9:34 PM

## 2017-05-24 NOTE — ED Notes (Signed)
Dinner tray given
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
## 2017-05-25 DIAGNOSIS — Z818 Family history of other mental and behavioral disorders: Secondary | ICD-10-CM

## 2017-05-25 DIAGNOSIS — S61512A Laceration without foreign body of left wrist, initial encounter: Secondary | ICD-10-CM

## 2017-05-25 DIAGNOSIS — R0602 Shortness of breath: Secondary | ICD-10-CM

## 2017-05-25 DIAGNOSIS — R4586 Emotional lability: Secondary | ICD-10-CM

## 2017-05-25 DIAGNOSIS — T1491XA Suicide attempt, initial encounter: Secondary | ICD-10-CM

## 2017-05-25 DIAGNOSIS — R4583 Excessive crying of child, adolescent or adult: Secondary | ICD-10-CM

## 2017-05-25 DIAGNOSIS — R440 Auditory hallucinations: Secondary | ICD-10-CM

## 2017-05-25 DIAGNOSIS — M791 Myalgia, unspecified site: Secondary | ICD-10-CM

## 2017-05-25 DIAGNOSIS — Z813 Family history of other psychoactive substance abuse and dependence: Secondary | ICD-10-CM

## 2017-05-25 DIAGNOSIS — T424X2A Poisoning by benzodiazepines, intentional self-harm, initial encounter: Secondary | ICD-10-CM

## 2017-05-25 DIAGNOSIS — R454 Irritability and anger: Secondary | ICD-10-CM

## 2017-05-25 DIAGNOSIS — F17210 Nicotine dependence, cigarettes, uncomplicated: Secondary | ICD-10-CM

## 2017-05-25 DIAGNOSIS — F3162 Bipolar disorder, current episode mixed, moderate: Secondary | ICD-10-CM

## 2017-05-25 DIAGNOSIS — G4700 Insomnia, unspecified: Secondary | ICD-10-CM

## 2017-05-25 MED ORDER — ARIPIPRAZOLE 5 MG PO TABS
5.0000 mg | ORAL_TABLET | Freq: Every day | ORAL | Status: DC
  Administered 2017-05-25 – 2017-05-28 (×4): 5 mg via ORAL
  Filled 2017-05-25: qty 7
  Filled 2017-05-25 (×5): qty 1

## 2017-05-25 MED ORDER — TOPIRAMATE 25 MG PO TABS
25.0000 mg | ORAL_TABLET | Freq: Every evening | ORAL | Status: DC
  Administered 2017-05-25 – 2017-05-27 (×3): 25 mg via ORAL
  Filled 2017-05-25 (×2): qty 1
  Filled 2017-05-25: qty 7
  Filled 2017-05-25 (×2): qty 1

## 2017-05-25 MED ORDER — LAMOTRIGINE 25 MG PO TABS
25.0000 mg | ORAL_TABLET | Freq: Every day | ORAL | Status: DC
  Administered 2017-05-25 – 2017-05-28 (×4): 25 mg via ORAL
  Filled 2017-05-25: qty 1
  Filled 2017-05-25: qty 7
  Filled 2017-05-25 (×4): qty 1

## 2017-05-25 NOTE — Progress Notes (Signed)
Nursing Progress Note [id number]  D) Patient presents calm, pleasant and cooperative. 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 Patient requests to have her bandage changed. Patient has sutures to L forearm from self-inflicted wound. Site appears intact, no s/s infection. Patient denies pain. Non-adhesive dressing applied and secured with tape. Patient denies SI/HI/AVH or pain. Patient contracts for safety on the unit. Patient reports sleeping well with current regimen. Patient compliant with medications.  A) Emotional support given. 1:1 interaction and active listening provided. Patient medicated as prescribed. Medications and plan of care reviewed with patient. Patient verbalized understanding without further questions. Snacks and fluids provided. Opportunities for questions or concerns presented to patient. Patient encouraged to continue to work on treatment goals. Labs, vital signs and patient behavior monitored throughout shift. Patient safety maintained with q15 min safety checks. Low fall risk precautions in place and reviewed with patient; patient verbalized understanding.  R) Patient receptive to interaction with nurse. Patient remains safe on the unit at this time. Patient denies any adverse medication reactions at this time. Patient is resting in bed without complaints. Will continue to monitor.
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
## 2017-05-25 NOTE — [facility] Suicide Risk Assessment (Signed)
[facility] INPATIENT:  Family/Significant Other Suicide Prevention Education  Suicide Prevention Education:  Education Completed; Christen ButterKevin Towe (pt's brother) [phone] has been identified by the patient as the family member/significant other with whom the patient will be residing, and identified as the person(s) who will aid the patient in the event of a mental health crisis (suicidal ideations/suicide attempt).  With written consent from the patient, the family member/significant other has been provided the following suicide prevention education, prior to the and/or following the discharge of the patient.  The suicide prevention education provided includes the following:  Suicide risk factors  Suicide prevention and interventions  National Suicide Hotline telephone number  [facility] assessment telephone number  [facility] Emergency Assistance 911  [facility] and/or Residential Mobile Crisis Unit telephone number  Request made of family/significant other to:  Remove weapons (e.g., guns, rifles, knives), all items previously/currently identified as safety concern.    Remove drugs/medications (over-the-counter, prescriptions, illicit drugs), all items previously/currently identified as a safety concern.  The family member/significant other verbalizes understanding of the suicide prevention education information provided.  The family member/significant other agrees to remove the items of safety concern listed above.  Pt's brother verified that he will be removing firearms from the home and pt does not have access. He has concerns that pt will continues to isolate and struggle with ongoing depressive and manic symptoms. "Getting her motivated to leave the home is the main problem." CSW discussed aftercare plan including [facility] groups [place]. "We will make it work and get her there." SPE reviewed with pt's brother.   Judith Davidian N Smart  LCSW 05/25/2017, 2:27  PM

## 2017-05-25 NOTE — Progress Notes (Signed)
Nursing Progress Note 1900-0730  D) Patient presents with anxious mood. Patient is isolative to her room and did not attend group. Patient showered this evening and requested her L forearm dressing be changed. Sutures intact, no s/s of infection. Non-adhesive pad applied, dressing clean dry and intact. Patient states, "I had a great visit with my brother this evening. I told him I will stop blaming him and his boyfriend for everything". Patient reports she plans to discharge back into the home where her brother lives. Patient denies SI/HI/AVH or pain. Patient contracts for safety on the unit. Patient reports sleeping well with current regimen. Patient compliant with medications. Patient with specimen cup and reminded to return  A) Emotional support given. 1:1 interaction and active listening provided. Patient medicated as prescribed. Medications and plan of care reviewed with patient. Patient verbalized understanding without further questions. Snacks and fluids provided. Opportunities for questions or concerns presented to patient. Patient encouraged to continue to work on treatment goals. Labs, vital signs and patient behavior monitored throughout shift. Patient safety maintained with q15 min safety checks. Low fall risk precautions in place and reviewed with patient; patient verbalized understanding.  R) Patient receptive to interaction with nurse. Patient remains safe on the unit at this time. Patient denies any adverse medication reactions at this time. Patient is resting in bed without complaints. Will continue to monitor.

## 2017-05-25 NOTE — Plan of Care (Signed)
Safety Care Plan Documentation  Goal: Periods of time without injury will increase Intervention: Patient is on q15 minute safety checks and low fall risk precautions. Patient verbally contracts for safety on the unit. Outcome: Patient remains safe at this time  05/25/2017 3:34 AM - Progressing by Ferrel Loganollazo, Beau Ramsburg A, RN   Activity Care Plan Documentation  Goal: Sleeping Patterns Will Improve Intervention: Provide sleep medications as needed and provide uninterrupted periods of sleep. Outcome: Patient is resting in bed with eyes closed; respirations even and unlabored. Patient reports sleeping well with current regimen. MHT observes patient resting during q15 minute checks.  05/25/2017 3:34 AM - Progressing by Ferrel Loganollazo, Charlen Bakula A, RN

## 2017-05-25 NOTE — Progress Notes (Signed)
Patient ID: Geryl RankinsStacey N Piche, female   DOB: Jan 24, 1976, 41 y.o.   MRN: [id number] Care of patient assumed this morning at 0700, and pt in bed at that time, reported that she had not gone to breakfast.  B/P was low at that time and was repeated: Sitting: 88/57, HR-93, and Standing: 108/63, HR-102.  Patient was given breakfast and observed to eat 100% of it, fluids were also given, patient was encouraged to drink and bring her pitcher for refills, and verbalized understanding.  Patient has been reclusive to her room, but was encouraged to stay up in the day room, and interactions with peers and group attendance was also encouraged.  Patient currently denies SI/HI/AVH, denies feeling dizzy or lightheaded, Left wrist dressing in place, and is clean/dry and intact, Q15 minute safety checks in place, will continue to monitor.

## 2017-05-25 NOTE — Plan of Care (Signed)
Safety Care Plan Documentation  Goal: Periods of time without injury will increase Intervention: Patient is on q15 minute safety checks and low fall risk precautions. Patient verbally contracts for safety on the unit. Outcome: Patient remains safe at this time  05/25/2017 9:05 PM - Progressing by Ferrel Loganollazo, Edell Mesenbrink A, RN

## 2017-05-25 NOTE — [facility] Group Notes (Signed)
LCSW Group Therapy Note  05/25/2017 1:15pm  Type of Therapy/Topic:  Group Therapy:  Feelings about Diagnosis  Participation Level: Active  Description of Group:   This group will allow patients to explore their thoughts and feelings about diagnoses they have received. Patients will be guided to explore their level of understanding and acceptance of these diagnoses. Facilitator will encourage patients to process their thoughts and feelings about the reactions of others to their diagnosis and will guide patients in identifying ways to discuss their diagnosis with significant others in their lives. This group will be process-oriented, with patients participating in exploration of their own experiences, giving and receiving support, and processing challenge from other group members.   Therapeutic Goals: 1. Patient will demonstrate understanding of diagnosis as evidenced by identifying two or more symptoms of the disorder 2. Patient will be able to express two feelings regarding the diagnosis 3. Patient will demonstrate their ability to communicate their needs through discussion and/or role play   Therapeutic Modalities:   Cognitive Behavioral Therapy Brief Therapy Feelings Identification    Nico Syme B Latori Beggs, MSW, LCSWA 05/25/2017 3:34 PM   

## 2017-05-25 NOTE — Progress Notes (Signed)
Pt did not attend wrap-up group   

## 2017-05-25 NOTE — [facility] Suicide Risk Assessment (Signed)
[facility]   Nursing information obtained from:   patient and chart  Demographic factors:   41 year old female, lives with brother  Current Mental Status:   see below  Loss Factors:    a friend of brother's moved in to their home  Historical Factors:   reports prior history of bipolar disorder  Risk Reduction Factors:   resilience   Total Time spent with patient: 45 minutes Principal Problem:  Bipolar Disorder, Mixed  Diagnosis:   Patient Active Problem List   Diagnosis Date Noted  . Bipolar 1 disorder, depressed (HCC) [F31.9] 05/24/2017  . Vitamin D deficiency [E55.9] 03/27/2016  . Dyslipidemia (high LDL; low HDL) [E78.5] 03/27/2016  . Elevated serum creatinine [R79.89] 03/27/2016  . 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  World Science writerHealth Organization [facility](WHO). Score between 0-7:  no or low risk or alcohol related problems. Score between 8-15:  moderate risk of alcohol related problems. Score between 16-19:  high risk of alcohol related problems. Score 20 or above:  warrants further diagnostic evaluation for alcohol dependence and treatment.   CLINICAL FACTORS:  Patient is a 41 year old female, lives with brother. Reports prior diagnosis of Bipolar Disorder, and endorses mixed symptoms, to include depression, sadness, and also irritability, lability, subjective sense of anger . She attempted suicide by cutting self and overdosing on Xanax , which she describes as impulsive .     Psychiatric Specialty Exam: Physical Exam  ROS  Blood pressure (!) 87/52, pulse 98, temperature 97.6 F (36.4 C), temperature source Oral, resp. rate 18, height 5\' 6"  (1.676 m), weight (!) 162.4 kg (358 lb).Body mass index is 57.78 kg/m.  See  admit note MSE   COGNITIVE FEATURES THAT CONTRIBUTE TO RISK:  Closed-mindedness and Loss of executive function    SUICIDE RISK:   Moderate:  Frequent suicidal ideation with limited intensity, and duration, some specificity in terms of plans, no associated intent, good self-control, limited dysphoria/symptomatology, some risk factors present, and identifiable protective factors, including available and accessible social support.  PLAN OF CARE: *Patient will be admitted to inpatient psychiatric unit for stabilization and safety. Will provide and encourage milieu participation. Provide medication management and maked adjustments as needed.  Will follow daily.    I certify that inpatient services furnished can reasonably be expected to improve the patient's condition.   Craige CottaFernando A Cobos, MD 05/25/2017, 8:43 AM

## 2017-05-25 NOTE — Progress Notes (Signed)
Recreation Therapy Notes  Animal-Assisted Activity (AAA) Program Checklist/Progress Notes Patient Eligibility Criteria Checklist & Daily Group note for Rec TxIntervention  Date: 11.13.2018 Time: 2:45pm [address]   AAA/T Program Assumption of Risk Form signed by Patient/ or Parent Legal Guardian Yes  Patient is free of allergies or sever asthma Yes  Patient reports no fear of animals Yes  Patient reports no history of cruelty to animals Yes  Patient understands his/her participation is voluntary Yes  Behavioral Response: Did not attend.   Marykay Lexenise L Annalycia Done, LRT/CTRS        Florentine Diekman L 05/25/2017 3:10 PM

## 2017-05-26 LAB — BASIC METABOLIC PANEL
ANION GAP: 7 (ref 5–15)
BUN: 8 mg/dL (ref 6–20)
CALCIUM: 8.7 mg/dL — AB (ref 8.9–10.3)
CO2: 26 mmol/L (ref 22–32)
Chloride: 106 mmol/L (ref 101–111)
Creatinine, Ser: 1.08 mg/dL — ABNORMAL HIGH (ref 0.44–1.00)
GLUCOSE: 160 mg/dL — AB (ref 65–99)
POTASSIUM: 3.2 mmol/L — AB (ref 3.5–5.1)
Sodium: 139 mmol/L (ref 135–145)

## 2017-05-26 LAB — URINALYSIS, COMPLETE (UACMP) WITH MICROSCOPIC
Bilirubin Urine: NEGATIVE
Glucose, UA: NEGATIVE mg/dL
KETONES UR: NEGATIVE mg/dL
Nitrite: NEGATIVE
PROTEIN: NEGATIVE mg/dL
Specific Gravity, Urine: 1.016 (ref 1.005–1.030)
pH: 6 (ref 5.0–8.0)

## 2017-05-26 LAB — HEMOGLOBIN A1C
Hgb A1c MFr Bld: 5.7 % — ABNORMAL HIGH (ref 4.8–5.6)
Mean Plasma Glucose: 116.89 mg/dL

## 2017-05-26 LAB — LIPID PANEL
CHOL/HDL RATIO: 4.9 ratio
CHOLESTEROL: 118 mg/dL (ref 0–200)
HDL: 24 mg/dL — AB (ref >40)
LDL Cholesterol: 56 mg/dL (ref 0–99)
Triglycerides: 191 mg/dL — ABNORMAL HIGH (ref <150)
VLDL: 38 mg/dL (ref 0–40)

## 2017-05-26 MED ORDER — METHOCARBAMOL 500 MG PO TABS
500.0000 mg | ORAL_TABLET | Freq: Three times a day (TID) | ORAL | Status: DC | PRN
  Administered 2017-05-26 – 2017-05-28 (×3): 500 mg via ORAL
  Filled 2017-05-26 (×3): qty 1

## 2017-05-26 MED ORDER — MIRTAZAPINE 7.5 MG PO TABS
7.5000 mg | ORAL_TABLET | Freq: Every day | ORAL | Status: DC
  Administered 2017-05-26 – 2017-05-27 (×2): 7.5 mg via ORAL
  Filled 2017-05-26 (×2): qty 1
  Filled 2017-05-26: qty 7
  Filled 2017-05-26 (×2): qty 1

## 2017-05-26 MED ORDER — POTASSIUM CHLORIDE CRYS ER 20 MEQ PO TBCR
20.0000 meq | EXTENDED_RELEASE_TABLET | Freq: Two times a day (BID) | ORAL | Status: AC
  Administered 2017-05-26 – 2017-05-27 (×3): 20 meq via ORAL
  Filled 2017-05-26 (×3): qty 1

## 2017-05-26 NOTE — Tx Team (Signed)
Interdisciplinary Treatment and Diagnostic Plan Update 05/26/2017 Time of Session: 9:30am  Judith RankinsStacey N [name]  MRN: [id number]  Principal Diagnosis: Bipolar Disorder, Mixed     Secondary Diagnoses: Active Problems:   Bipolar 1 disorder, depressed (HCC)   Current Medications:  Current Facility-Administered Medications  Medication Dose Route Frequency Provider Last Rate Last Dose  . ARIPiprazole (ABILIFY) tablet 5 mg  5 mg Oral Daily Cobos, Rockey SituFernando A, MD   5 mg at 05/26/17 0801  . atorvastatin (LIPITOR) tablet 20 mg  20 mg Oral QPM Fransisca Kaufmannavis, Laura A, NP   20 mg at 05/25/17 1731  . diphenhydrAMINE (BENADRYL) capsule 50 mg  50 mg Oral QHS Fransisca Kaufmannavis, Laura A, NP   50 mg at 05/25/17 2206  . fluticasone (FLONASE) 50 MCG/ACT nasal spray 2 spray  2 spray Each Nare QHS PRN Fransisca Kaufmannavis, Laura A, NP      . gabapentin (NEURONTIN) capsule 300 mg  300 mg Oral TID Fransisca Kaufmannavis, Laura A, NP   300 mg at 05/26/17 0801  . lamoTRIgine (LAMICTAL) tablet 25 mg  25 mg Oral Daily Cobos, Rockey SituFernando A, MD   25 mg at 05/26/17 0801  . mirtazapine (REMERON) tablet 7.5 mg  7.5 mg Oral QHS Cobos, Fernando A, MD      . pantoprazole (PROTONIX) EC tablet 40 mg  40 mg Oral Daily Fransisca Kaufmannavis, Laura A, NP   40 mg at 05/26/17 0801  . topiramate (TOPAMAX) tablet 25 mg  25 mg Oral QPM Cobos, Rockey SituFernando A, MD   25 mg at 05/25/17 1730    PTA Medications: Medications Prior to Admission  Medication Sig Dispense Refill Last Dose  . albuterol (PROVENTIL HFA;VENTOLIN HFA) 108 (90 BASE) MCG/ACT inhaler Inhale 2 puffs into the lungs every 6 (six) hours as needed for wheezing or shortness of breath.   05/22/2017 at Unknown time  . atorvastatin (LIPITOR) 20 MG tablet Take 20 mg by mouth every evening.   05/21/2017 at Unknown time  . diphenhydrAMINE (BENADRYL) 25 mg capsule Take 50 mg at bedtime by mouth.    05/21/2017 at Unknown time  . fluticasone (FLONASE) 50 MCG/ACT nasal spray Place 2 sprays into both nostrils at bedtime as needed (for allergies/cold symptoms).    05/21/2017 at Unknown time  . gabapentin (NEURONTIN) 300 MG capsule Take 300 mg 3 (three) times daily by mouth.   05/22/2017 at Unknown time  . hydrochlorothiazide (HYDRODIURIL) 25 MG tablet Take 25 mg by mouth daily.   05/22/2017 at Unknown time  . ibuprofen (ADVIL,MOTRIN) 600 MG tablet Take 1 tablet (600 mg total) by mouth every 6 (six) hours as needed. (Patient taking differently: Take 600 mg 3 (three) times daily by mouth. ) 30 tablet 0 05/22/2017 at Unknown time  . Liniments (SALONPAS ARTHRITIS PAIN RELIEF EX) Apply 1 application daily as needed topically.   Past Week at Unknown time  . Menthol, Topical Analgesic, (BIOFREEZE) 4 % GEL Apply 1 application topically 4 (four) times daily as needed (for back pain.).   Past Week at Unknown time  . Menthol-Camphor (ICY HOT ADVANCED RELIEF) 16-11 % CREA Apply 1 application topically 4 (four) times daily as needed (for pain).   Past Week at Unknown time  . methocarbamol (ROBAXIN) 750 MG tablet Take 750 mg 4 (four) times daily by mouth.    05/22/2017 at Unknown time  . omeprazole (PRILOSEC) 40 MG capsule Take 40 mg by mouth 2 (two) times daily.    05/22/2017 at Unknown time  . QUEtiapine (SEROQUEL) 100 MG tablet Take  100 mg at bedtime by mouth.    05/21/2017 at Unknown time  . topiramate (TOPAMAX) 50 MG tablet Take 50 mg by mouth every evening.    05/21/2017 at Unknown time  . Vitamin D, Ergocalciferol, (DRISDOL) 50000 units CAPS capsule Take 50,000 Units by mouth every Wednesday. Wednesdays    05/19/2017 at Unknown time    Treatment Modalities: Medication Management, Group therapy, Case management,  1 to 1 session with clinician, Psychoeducation, Recreational therapy.  Patient Stressors: Financial difficulties Marital or family conflict Other: loss of insurance Patient Strengths: Ability for insight Average or above average intelligence Wellsite geologist fund of knowledge  Physician Treatment Plan for Primary Diagnosis: Bipolar Disorder,  Mixed    Long Term Goal(s): Improvement in symptoms so as ready for discharge Short Term Goals: Ability to identify changes in lifestyle to reduce recurrence of condition will improve Ability to disclose and discuss suicidal ideas Ability to demonstrate self-control will improve Ability to identify and develop effective coping behaviors will improve Ability to maintain clinical measurements within normal limits will improve Compliance with prescribed medications will improve Ability to identify triggers associated with substance abuse/mental health issues will improve  Medication Management: Evaluate patient's response, side effects, and tolerance of medication regimen.  Therapeutic Interventions: 1 to 1 sessions, Unit Group sessions and Medication administration.  Evaluation of Outcomes: Adequate for Discharge  Physician Treatment Plan for Secondary Diagnosis: Active Problems:   Bipolar 1 disorder, depressed (HCC)  Long Term Goal(s): Improvement in symptoms so as ready for discharge  Short Term Goals: Ability to identify changes in lifestyle to reduce recurrence of condition will improve Ability to disclose and discuss suicidal ideas Ability to demonstrate self-control will improve Ability to identify and develop effective coping behaviors will improve Ability to maintain clinical measurements within normal limits will improve Compliance with prescribed medications will improve Ability to identify triggers associated with substance abuse/mental health issues will improve  Medication Management: Evaluate patient's response, side effects, and tolerance of medication regimen.  Therapeutic Interventions: 1 to 1 sessions, Unit Group sessions and Medication administration.  Evaluation of Outcomes: Adequate for Discharge  RN Treatment Plan for Primary Diagnosis: Bipolar Disorder, Mixed    Long Term Goal(s): Knowledge of disease and therapeutic regimen to maintain health will  improve  Short Term Goals: Ability to identify and develop effective coping behaviors will improve and Compliance with prescribed medications will improve  Medication Management: RN will administer medications as ordered by provider, will assess and evaluate patient's response and provide education to patient for prescribed medication. RN will report any adverse and/or side effects to prescribing provider.  Therapeutic Interventions: 1 on 1 counseling sessions, Psychoeducation, Medication administration, Evaluate responses to treatment, Monitor vital signs and CBGs as ordered, Perform/monitor CIWA, COWS, AIMS and Fall Risk screenings as ordered, Perform wound care treatments as ordered.  Evaluation of Outcomes: Adequate for Discharge  LCSW Treatment Plan for Primary Diagnosis: Bipolar Disorder, Mixed    Long Term Goal(s): Safe transition to appropriate next level of care at discharge, Engage patient in therapeutic group addressing interpersonal concerns. Short Term Goals: Engage patient in aftercare planning with referrals and resources, Increase ability to appropriately verbalize feelings, Increase emotional regulation, Facilitate patient progression through stages of change regarding substance use diagnoses and concerns, Identify triggers associated with mental health/substance abuse issues and Increase skills for wellness and recovery  Therapeutic Interventions: Assess for all discharge needs, 1 to 1 time with Social worker, Explore available resources and support systems, Assess for adequacy  in community support network, Educate family and significant other(s) on suicide prevention, Complete Psychosocial Assessment, Interpersonal group therapy.  Evaluation of Outcomes: Adequate for Discharge  Progress in Treatment: Attending groups: Intermittently  Participating in groups: Minimally  Taking medication as prescribed: Yes, MD continues to assess for medication changes as needed Toleration  medication: Yes, no side effects reported at this time Family/Significant other contact made: Yes, pt's brother contacted. Patient understands diagnosis: Developing insight  Discussing patient identified problems/goals with staff: Yes Medical problems stabilized or resolved: Yes Denies suicidal/homicidal ideation: Yes  Issues/concerns per patient self-inventory: None Other: N/A  New problem(s) identified: None identified at this time.   New Short Term/Long Term Goal(s): None identified at this time.   Discharge Plan or Barriers: Pt will return home and [facility]  Reason for Continuation of Hospitalization:  None identified at this time.  Estimated Length of Stay: 0 days; Pt will liklely discharge today 05/26/17  Attendees: Patient: 05/26/2017 11:26 AM  Physician: Dr. Jama Flavorsobos 05/26/2017 11:26 AM  Nursing: Alexia FreestonePatty, RN; Foy Guadalajarahrista, RN 05/26/2017 11:26 AM  RN Care Manager: Onnie BoerJennifer Clark, RN 05/26/2017 11:26 AM  Social Worker: Donnelly StagerLynn Sharica Roedel, LCSWA 05/26/2017 11:26 AM  Recreational Therapist:  05/26/2017 11:26 AM  Other: Armandina StammerAgnes Nwoko, NP 05/26/2017 11:26 AM  Other:  05/26/2017 11:26 AM  Other: 05/26/2017 11:26 AM  Scribe for Treatment Team: Jonathon JordanLynn B Terin Cragle, MSW,LCSWA 05/26/2017 11:26 AM

## 2017-05-26 NOTE — Progress Notes (Signed)
    D patient is seen standing at the med window this ams...she shares that she is feeling " irritable..  I don't usually feel that way". She makes good eye contact. She is articulate as she shares her feelings with this Clinical research associatewriter. She explains that she has " been on seroquel for a very long time and now they took me off of it". A She completed her daily assessment and on this she wrote she denied SI today and she rated her depression, hopelessness and anxiety " 3/1/4", respectively. She takes her scheduled meds as planned. She takes notes about when her meds are due and she asks relevant questions regarding her meds and poc. R Safety is in place and poc cont.

## 2017-05-26 NOTE — Progress Notes (Signed)
Urine specimen collected from patient and placed in designated refrigerator for lab to collect this morning.

## 2017-05-26 NOTE — [facility] Group Notes (Addendum)
[facility] 05/26/2017 1:15pm  Type of Therapy: Mental Health Association Presentation  Participation Level: Pt invited. Did not attend.   Jonathon JordanLynn B Jennilee Demarco, MSW, LCSWA 05/26/2017 3:30 PM

## 2017-05-26 NOTE — Progress Notes (Signed)
Recreation Therapy Notes  Date:  05/26/17 Time: 0930 Location: [address] Day[address]  Group Topic: Stress Management  Goal Area(s) Addresses:  Patient will verbalize importance of using healthy stress management.  Patient will identify positive emotions associated with healthy stress management.   Intervention: Stress Management  Activity :  Guided Imagery.  LRT introduced the stress management technique of guided imagery.  LRT read a script to help patients envision their safe and peaceful place.  Patients were to follow along as LRT read script.  Education:  Stress Management, Discharge Planning.   Education Outcome: Acknowledges edcuation/In group clarification offered/Needs additional education  Clinical Observations/Feedback: Pt did not attend group.    [name], LRT/CTRS         [name] 05/26/2017 12:47 PM 

## 2017-05-26 NOTE — Progress Notes (Signed)
  [facility]  Will you be returning to the same living situation after discharge:  Yes,  pt returning home. At discharge, do you have transportation home?: Yes,  pt has access to transportation. Do you have the ability to pay for your medications: Yes,  prescriptions and samples provided.  Release of information consent forms completed and in the chart;  Patient's signature needed at discharge.  Patient to Follow up at: Follow-up Information    [facility] on 05/31/2017.   Why:  [facility]. You will be enrolling for [employer] funding. Please call the office if you need to cancel or reschedule your appointment. Thank you. Contact information: [address] [place] [address] [phone]           Next level of care provider [facility] Link:no  Safety Planning and Suicide Prevention discussed: Yes,  with pt and with pt's brother.  Have you used any form of tobacco in the last 30 days? (Cigarettes, Smokeless Tobacco, Cigars, and/or Pipes): Yes  Has patient been referred to the Quitline?: Patient refused referral  Patient has been referred for addiction treatment: Yes  Jonathon JordanLynn B Shemicka Cohrs, MSW, LCSWA 05/26/2017, 10:47 AM

## 2017-05-26 NOTE — Progress Notes (Signed)
[facility] MD Progress Note  05/26/2017 9:52 AM [name]  MRN:  [id number] Subjective:  Patient reports some ongoing subjective sense of irritability and depression, but states she feels slightly better than prior to admission. Today denies suicidal ideations. Reports insomnia, and states that in the past Remeron has been well tolerated and effective . She denies medication side effects.  Reports insomnia, and states she slept fairly last night . Also reports long history of muscular aches and pains which she takes Robaxin for with good response . ( Currently off NSAIDs due to elevated creatinine on admission) .   Objective : I have discussed case with treatment team and have met with patient. Staff reports that patient remains vaguely irritable, anxious, isolative . She presents vaguely dysphoric, irritable, but reports she feels her mood is improving . Denies suicidal ideations. Today does not endorse hallucinations and does not appear internally preoccupied . Insomnia is major concern for patient at this time. ( She had been on Seroquel prior to admission, which was helping her sleep, but was associated with significant weight gain ). No disruptive or agitated behaviors on unit, going to some groups . I reviewed case with hospitalist consultant yesterday, regarding elevated Creatinine, hypotension- recommendation was to discontinue HCTZ, NSAIDs , push PO fluids . Today presents improved, denies dizziness or lightheadedness, and vitals are improved ( 109/71, pulse 88). Repeat labs- Creatinine improved to 1.08, BUN 8, GFR >60. K+ 3.2 Thus far tolerating Abilify and Lamictal well. Principal Problem:  Bipolar Disorder, Depressed versus Mixed  Diagnosis:   Patient Active Problem List   Diagnosis Date Noted  . Bipolar 1 disorder, depressed ([place]) [F31.9] 05/24/2017  . Vitamin D deficiency [E55.9] 03/27/2016  . Dyslipidemia (high LDL; low HDL) [E78.5] 03/27/2016  . Elevated serum creatinine [R79.89]  03/27/2016  . 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 Asthma   . Bipolar disorder ([place])   . Concussion   . Dyslipidemia (high LDL; low HDL) 03/27/2016  . Dyspnea    with exertion  . Elevated serum creatinine 03/27/2016  . Fibromyalgia   . GERD (gastroesophageal reflux disease)   . Headache(784.0)   . Hypertension   . Mental disorder    anxiety  . Pneumonia 2015  . Vitamin D deficiency 03/27/2016    Past Surgical History:  Procedure Laterality Date  . CYST REMOVAL HAND Left   . ESOPHAGEAL DILATION    . FOOT SURGERY Right    'knot removed"  . WISDOM TOOTH EXTRACTION     Family History:  Family History  Problem Relation Age of Onset  . Cancer Maternal Grandmother        lung, back  . Diabetes Paternal Grandmother   . Heart disease Paternal Grandmother   . Diabetes Paternal Grandfather   . Heart disease Paternal Grandfather   . Stroke Paternal Grandfather    Social History:  Social History   Substance and Sexual Activity  Alcohol Use Yes   Comment: rare     Social History   Substance and Sexual Activity  Drug Use No    Social History   Socioeconomic History  . Marital status: Single    Spouse name: None  . Number of children: None  . Years of education: None  . Highest education level: None  Social Needs  . Financial resource strain: None  .  Food insecurity - worry: None  . Food insecurity - inability: None  . Transportation needs - medical: None  . Transportation needs - non-medical: None  Occupational History  . None  Tobacco Use  . Smoking status: Current Every Day Smoker    Packs/day: 0.50    Years: 10.00    Pack years: 5.00    Types: Cigarettes  . Smokeless tobacco: Never Used  Substance and Sexual Activity  . Alcohol use: Yes    Comment: rare  . Drug use: No  . Sexual activity: Not  Currently    Partners: Male    Birth control/protection: None, Abstinence  Other Topics Concern  . None  Social History Narrative  . None   Additional Social History:   Sleep: Fair  Appetite:  improving   Current Medications: Current Facility-Administered Medications  Medication Dose Route Frequency Provider Last Rate Last Dose  . ARIPiprazole (ABILIFY) tablet 5 mg  5 mg Oral Daily Eliska Hamil, Myer Peer, MD   5 mg at 05/26/17 0801  . atorvastatin (LIPITOR) tablet 20 mg  20 mg Oral QPM Elmarie Shiley A, NP   20 mg at 05/25/17 1731  . diphenhydrAMINE (BENADRYL) capsule 50 mg  50 mg Oral QHS Elmarie Shiley A, NP   50 mg at 05/25/17 2206  . fluticasone (FLONASE) 50 MCG/ACT nasal spray 2 spray  2 spray Each Nare QHS PRN Elmarie Shiley A, NP      . gabapentin (NEURONTIN) capsule 300 mg  300 mg Oral TID Elmarie Shiley A, NP   300 mg at 05/26/17 0801  . lamoTRIgine (LAMICTAL) tablet 25 mg  25 mg Oral Daily Iviana Blasingame, Myer Peer, MD   25 mg at 05/26/17 0801  . mirtazapine (REMERON) tablet 7.5 mg  7.5 mg Oral QHS Mollye Guinta A, MD      . pantoprazole (PROTONIX) EC tablet 40 mg  40 mg Oral Daily Elmarie Shiley A, NP   40 mg at 05/26/17 0801  . topiramate (TOPAMAX) tablet 25 mg  25 mg Oral QPM Rane Blitch, Myer Peer, MD   25 mg at 05/25/17 1730    Lab Results:  Results for orders placed or performed during the hospital encounter of 05/24/17 (from the past 48 hour(s))  Basic metabolic panel     Status: Abnormal   Collection Time: 05/26/17  7:47 AM  Result Value Ref Range   Sodium 139 135 - 145 mmol/L   Potassium 3.2 (L) 3.5 - 5.1 mmol/L   Chloride 106 101 - 111 mmol/L   CO2 26 22 - 32 mmol/L   Glucose, Bld 160 (H) 65 - 99 mg/dL   BUN 8 6 - 20 mg/dL   Creatinine, Ser 1.08 (H) 0.44 - 1.00 mg/dL   Calcium 8.7 (L) 8.9 - 10.3 mg/dL   GFR calc non Af Amer >60 >60 mL/min   GFR calc Af Amer >60 >60 mL/min    Comment: (NOTE) The eGFR has been calculated using the CKD EPI equation. This calculation has not been  validated in all clinical situations. eGFR's persistently <60 mL/min signify possible Chronic Kidney Disease.    Anion gap 7 5 - 15    Comment: Performed at [facility], [place] [address], [place], [place] [address]    Blood Alcohol level:  Lab Results  Component Value Date   [facility] <10 05/22/2017   ETH <11 62/83/1517    Metabolic Disorder Labs: Lab Results  Component Value Date   HGBA1C 5.4 03/19/2016   No results  found for: PROLACTIN Lab Results  Component Value Date   CHOL 205 (H) 03/19/2016   TRIG 123 03/19/2016   HDL 27 (L) 03/19/2016   CHOLHDL 7.6 (H) 03/19/2016   LDLCALC 153 (H) 03/19/2016    Physical Findings: AIMS: Facial and Oral Movements Muscles of Facial Expression: None, normal Lips and Perioral Area: None, normal Jaw: None, normal Tongue: None, normal,Extremity Movements Upper (arms, wrists, hands, fingers): None, normal Lower (legs, knees, ankles, toes): None, normal, Trunk Movements Neck, shoulders, hips: None, normal, Overall Severity Severity of abnormal movements (highest score from questions above): None, normal Incapacitation due to abnormal movements: None, normal Patient's awareness of abnormal movements (rate only patient's report): No Awareness, Dental Status Current problems with teeth and/or dentures?: No Does patient usually wear dentures?: No  CIWA:    COWS:     Musculoskeletal: Strength & Muscle Tone: within normal limits Gait & Station: normal Patient leans: N/A  Psychiatric Specialty Exam: Physical Exam  ROS denies chest pain, no shortness of breath, today does not endorse dysuria or urgency, no fever or chills. Endorses history of chronic myalgias  Blood pressure 109/71, pulse 88, temperature 97.7 F (36.5 C), temperature source Oral, resp. rate 16, height 5' 6" (1.676 m), weight (!) 162.4 kg (358 lb).Body mass index is 57.78 kg/m.  General Appearance: Fairly Groomed  Eye Contact:  Good  Speech:  Normal Rate   Volume:  Normal  Mood:  remains vaguely depressed, dysphoric   Affect:  slightly irritable, but reactive, smiles briefly at times   Thought Process:  Linear and Descriptions of Associations: Intact  Orientation:  Other:  fully alert and attentive   Thought Content:  denies hallucinations, no delusions epxressed   Suicidal Thoughts:  No denies suicidal or self injurious ideations at this time, and contracts for safety on unit, denies HI  Homicidal Thoughts:  No  Memory:  recent and remote grossly intact   Judgement:  Fair- improving   Insight:  Fair- improving   Psychomotor Activity:  Normal  Concentration:  Concentration: Good and Attention Span: Good  Recall:  Good  Fund of Knowledge:  Good  Language:  Good  Akathisia:  Negative  Handed:  Right  AIMS (if indicated):     Assets:  Desire for Improvement Resilience  ADL's:  Intact  Cognition:  WNL  Sleep:  Number of Hours: 6.25   Assessment - patient remains vaguely dysphoric, irritable, anxious . Denies suicidal ideations and today does not endorse or present with psychotic symptoms. Thus far tolerating Abilify, Lamictal well. Reports some ongoing insomnia as a major concern and states Remeron has been effective and well tolerated in the past .  Creatinine/GFR improved . Vitals are stable at this time.   Treatment Plan Summary: Daily contact with patient to assess and evaluate symptoms and progress in treatment, Medication management, Plan inpatient treatment and medications as below Encourage group and milieu participation to work on coping skills and symptom reduction Continue Lamictal 25 mgrs QDAY for mood disorder  Continue Abilify 5 mgrs QDAY for mood disorder/psychosis   Start Remeron 7.5 mgrs QHS for depression, anxiety, insomnia  Continue Neurontin 300 mgrs TID for anxiety, pain Start KDUR for Hypokalemia Treatment team working on disposition planning options   Jenne Campus, MD 05/26/2017, 9:52 AM

## 2017-05-27 LAB — PROLACTIN: Prolactin: 23.7 ng/mL — ABNORMAL HIGH (ref 4.8–23.3)

## 2017-05-27 LAB — VITAMIN D 25 HYDROXY (VIT D DEFICIENCY, FRACTURES): Vit D, 25-Hydroxy: 12.9 ng/mL — ABNORMAL LOW (ref 30.0–100.0)

## 2017-05-27 MED ORDER — VITAMIN D3 25 MCG (1000 UNIT) PO TABS
1000.0000 [IU] | ORAL_TABLET | Freq: Every day | ORAL | Status: DC
  Administered 2017-05-27 – 2017-05-28 (×2): 1000 [IU] via ORAL
  Filled 2017-05-27 (×4): qty 1
  Filled 2017-05-27: qty 7

## 2017-05-27 MED ORDER — BACITRACIN-NEOMYCIN-POLYMYXIN OINTMENT TUBE
TOPICAL_OINTMENT | Freq: Every day | CUTANEOUS | Status: DC
  Administered 2017-05-27: 18:00:00 via TOPICAL
  Filled 2017-05-27: qty 14.17
  Filled 2017-05-27: qty 1

## 2017-05-27 MED ORDER — MIRTAZAPINE 15 MG PO TABS
7.5000 mg | ORAL_TABLET | Freq: Once | ORAL | Status: AC
  Administered 2017-05-27: 7.5 mg via ORAL

## 2017-05-27 MED ORDER — TRAZODONE HCL 50 MG PO TABS
50.0000 mg | ORAL_TABLET | Freq: Every evening | ORAL | Status: DC | PRN
  Administered 2017-05-27: 50 mg via ORAL
  Filled 2017-05-27: qty 7

## 2017-05-27 NOTE — Progress Notes (Signed)
D: Patient reports poor sleep since her seroquel has been decreased.  She reports her appetite is good; her energy level is high and her concentration is good.  Her affect is bright; her mood stable.  She rates her depression as a 2 to 3; anxiety as a 2.  She denies any thoughts of self harm.  Her goal today is be "more socially interactive."  She is observed in the day room playing cards and interacting with her peers. Patient has her follow up appointment in place for discharge. A: Continue to monitor medication management and MD orders. Safety checks continued every 15 minutes per protocol.  Offer support and encouragement as needed. R: Patient is receptive to staff; her behavior is appropriate.

## 2017-05-27 NOTE — Progress Notes (Signed)
Nursing Progress Note: 7p-7a D: Pt currently presents with a pleasant affect and behavior. Pt states "I had a good day. I went to most the groups. I feel better." Interacting appropriately with the milieu. Pt reports good sleep during the previous night with current medication regimen. Pt did attend wrap-up group.  A: Pt provided with medications per providers orders. Pt's labs and vitals were monitored throughout the night. Pt supported emotionally and encouraged to express concerns and questions. Pt educated on medications.  R: Pt's safety ensured with 15 minute and environmental checks. Pt currently denies SI, HI, and AVH. Pt verbally contracts to seek staff if SI,HI, or AVH occurs and to consult with staff before acting on any harmful thoughts. Will continue to monitor.

## 2017-05-27 NOTE — Plan of Care (Addendum)
Safety Care Plan Documentation  Goal: Periods of time without injury will increase Intervention: Patient is on q15 minute safety checks and low fall risk precautions. Patient verbally contracts for safety on the unit. Outcome: Patient remains safe at this time  05/27/2017 12:56 AM - Progressing by Ferrel Loganollazo, Gricelda Foland A, RN   Health Behavior/Discharge Planning Care Plan Documentation  Goal: Compliance for underlying treatment cause of condition will improve Intervention: Encourage compliance with groups/medication regimen.  Outcome: Patient taking medications and attending groups per plan of care. Patient verbalizes understanding and is agreeable to current plan of care.  05/27/2017 12:56 AM - Progressing by Ferrel Loganollazo, Haeleigh Streiff A, RN

## 2017-05-27 NOTE — Progress Notes (Addendum)
Nursing Progress Note 1900-0730  D) Patient presents with pleasant mood and animated affect. Patient reports she had a great visit with her daughter this evening and it made her "emotional, but happy". Patient did attend group. Patient is seen interactive in the milieu. Patient states "I have social anxiety but I was actually able to be up and play cards in the dayroom today. I realized we are all going through the same things". Patient denies SI/HI/AVH or pain. Patient contracts for safety on the unit. Patient reports sleeping well with current regimen. Patient compliant with medications. Patient dressing changed this evening, no s/s of infection to L forearm sutures. New dressing dry, clean, intact. Patient reports difficulty sleeping this evening and is provided one time repeat dose of Remeron 7.5 mg per provider orders.  A) Emotional support given. 1:1 interaction and active listening provided. Patient medicated as prescribed. Medications and plan of care reviewed with patient. Patient verbalized understanding without further questions. Snacks and fluids provided. Opportunities for questions or concerns presented to patient. Patient encouraged to continue to work on treatment goals. Labs, vital signs and patient behavior monitored throughout shift. Patient safety maintained with q15 min safety checks. Low fall risk precautions in place and reviewed with patient; patient verbalized understanding.  R) Patient receptive to interaction with nurse. Patient remains safe on the unit at this time. Patient denies any adverse medication reactions at this time. Patient is resting in bed without complaints. Will continue to monitor.

## 2017-05-27 NOTE — Progress Notes (Addendum)
[facility] MD Progress Note  05/27/2017 8:51 AM CASSIDIE VEIGA  MRN:  [id number] Subjective: reports she is feeling better, less depressed. States she has noticed being less isolative and reports that today she enjoyed playing cards with peers . Denies medication side effects. Denies suicidal ideations. Reports insomnia as an ongoing issue.  Objective : I have discussed case with treatment team and have met with patient. Reports improving mood , less anhedonia, improving energy level. Affect is more reactive. Labs reviewed- Vitamin D level very low at 12.9. Creatinine improved at 1.08 and GFR>60. Visible on unit, going to groups, no disruptive or agitated behaviors. Her major concern at this time is insomnia . L forearm sutures inspected- slight erythema around sutures, but no pus or bleeding , denies increased pain    Principal Problem:  Bipolar Disorder, Depressed versus Mixed  Diagnosis:   Patient Active Problem List   Diagnosis Date Noted  . Bipolar 1 disorder, depressed ([place]) [F31.9] 05/24/2017  . Vitamin D deficiency [E55.9] 03/27/2016  . Dyslipidemia (high LDL; low HDL) [E78.5] 03/27/2016  . Elevated serum creatinine [R79.89] 03/27/2016  . Cervical disc disorder with radiculopathy of cervical region [M50.10] 12/20/2014   Total Time spent with patient: 15 minutes  Past Medical History:  Past Medical History:  Diagnosis Date  . Asthma   . Bipolar disorder ([place])   . Concussion   . Dyslipidemia (high LDL; low HDL) 03/27/2016  . Dyspnea    with exertion  . Elevated serum creatinine 03/27/2016  . Fibromyalgia   . GERD (gastroesophageal reflux disease)   . Headache(784.0)   . Hypertension   . Mental disorder    anxiety  . Pneumonia 2015  . Vitamin D deficiency 03/27/2016    Past Surgical History:  Procedure Laterality Date  . CYST REMOVAL HAND Left   . ESOPHAGEAL DILATION    . FOOT SURGERY Right    'knot removed"  . MULTIPLE EXTRACTIONS WITH ALVEOLOPLASTY N/A 05/29/2016   Procedure: MULTIPLE EXTRACTIONS;  Surgeon: Diona Browner, DDS;  Location: [facility];  Service: Oral Surgery;  Laterality: N/A;  . WISDOM TOOTH EXTRACTION     Family History:  Family History  Problem Relation Age of Onset  . Cancer Maternal Grandmother        lung, back  . Diabetes Paternal Grandmother   . Heart disease Paternal Grandmother   . Diabetes Paternal Grandfather   . Heart disease Paternal Grandfather   . Stroke Paternal Grandfather    Social History:  Social History   Substance and Sexual Activity  Alcohol Use Yes   Comment: rare     Social History   Substance and Sexual Activity  Drug Use No    Social History   Socioeconomic History  . Marital status: Single    Spouse name: None  . Number of children: None  . Years of education: None  . Highest education level: None  Social Needs  . Financial resource strain: None  . Food insecurity - worry: None  . Food insecurity - inability: None  . Transportation needs - medical: None  . Transportation needs - non-medical: None  Occupational History  . None  Tobacco Use  . Smoking status: Current Every Day Smoker    Packs/day: 0.50    Years: 10.00    Pack years: 5.00    Types: Cigarettes  . Smokeless tobacco: Never Used  Substance and Sexual Activity  . Alcohol use: Yes    Comment: rare  . Drug use: No  .  Sexual activity: Not Currently    Partners: Male    Birth control/protection: None, Abstinence  Other Topics Concern  . None  Social History Narrative  . None   Additional Social History:   Sleep: Fair  Appetite:  improving   Current Medications: Current Facility-Administered Medications  Medication Dose Route Frequency Provider Last Rate Last Dose  . ARIPiprazole (ABILIFY) tablet 5 mg  5 mg Oral Daily , Myer Peer, MD   5 mg at 05/27/17 0263  . atorvastatin (LIPITOR) tablet 20 mg  20 mg Oral QPM Elmarie Shiley A, NP   20 mg at 05/26/17 1707  . diphenhydrAMINE (BENADRYL) capsule 50 mg  50 mg Oral  QHS Elmarie Shiley A, NP   50 mg at 05/26/17 2109  . fluticasone (FLONASE) 50 MCG/ACT nasal spray 2 spray  2 spray Each Nare QHS PRN Elmarie Shiley A, NP      . gabapentin (NEURONTIN) capsule 300 mg  300 mg Oral TID Elmarie Shiley A, NP   300 mg at 05/27/17 7858  . lamoTRIgine (LAMICTAL) tablet 25 mg  25 mg Oral Daily , Myer Peer, MD   25 mg at 05/27/17 8502  . methocarbamol (ROBAXIN) tablet 500 mg  500 mg Oral Q8H PRN , Myer Peer, MD   500 mg at 05/27/17 0835  . mirtazapine (REMERON) tablet 7.5 mg  7.5 mg Oral QHS , Myer Peer, MD   7.5 mg at 05/26/17 2109  . pantoprazole (PROTONIX) EC tablet 40 mg  40 mg Oral Daily Elmarie Shiley A, NP   40 mg at 05/27/17 7741  . potassium chloride SA (K-DUR,KLOR-CON) CR tablet 20 mEq  20 mEq Oral BID , Myer Peer, MD   20 mEq at 05/27/17 2878  . topiramate (TOPAMAX) tablet 25 mg  25 mg Oral QPM , Myer Peer, MD   25 mg at 05/26/17 1707    Lab Results:  Results for orders placed or performed during the hospital encounter of 05/24/17 (from the past 48 hour(s))  Urinalysis, Complete w Microscopic     Status: Abnormal   Collection Time: 05/25/17  5:27 PM  Result Value Ref Range   Color, Urine YELLOW YELLOW   APPearance CLOUDY (A) CLEAR   Specific Gravity, Urine 1.016 1.005 - 1.030   pH 6.0 5.0 - 8.0   Glucose, UA NEGATIVE NEGATIVE mg/dL   Hgb urine dipstick SMALL (A) NEGATIVE   Bilirubin Urine NEGATIVE NEGATIVE   Ketones, ur NEGATIVE NEGATIVE mg/dL   Protein, ur NEGATIVE NEGATIVE mg/dL   Nitrite NEGATIVE NEGATIVE   Leukocytes, UA LARGE (A) NEGATIVE   RBC / HPF TOO NUMEROUS TO COUNT 0 - 5 RBC/hpf   WBC, UA TOO NUMEROUS TO COUNT 0 - 5 WBC/hpf   Bacteria, UA FEW (A) NONE SEEN   Squamous Epithelial / LPF 6-30 (A) NONE SEEN   Mucus PRESENT     Comment: Performed at [facility], [place] [address], [place], [place] [address]  Lipid panel     Status: Abnormal   Collection Time: 05/26/17  7:47 AM  Result Value Ref Range    Cholesterol 118 0 - 200 mg/dL   Triglycerides 191 (H) <150 mg/dL   HDL 24 (L) >40 mg/dL   Total CHOL/HDL Ratio 4.9 RATIO   VLDL 38 0 - 40 mg/dL   LDL Cholesterol 56 0 - 99 mg/dL    Comment:        Total Cholesterol/HDL:CHD Risk Coronary Heart Disease Risk Table  Men   Women  1/2 Average Risk   3.4   3.3  Average Risk       5.0   4.4  2 X Average Risk   9.6   7.1  3 X Average Risk  23.4   11.0        Use the calculated Patient Ratio above and the CHD Risk Table to determine the patient's CHD Risk.        ATP III CLASSIFICATION (LDL):  <100     mg/dL   Optimal  100-129  mg/dL   Near or Above                    Optimal  130-159  mg/dL   Borderline  160-189  mg/dL   High  >190     mg/dL   Very High Performed at [facility] [address], [place], [place] [address]   Prolactin     Status: Abnormal   Collection Time: 05/26/17  7:47 AM  Result Value Ref Range   Prolactin 23.7 (H) 4.8 - 23.3 ng/mL    Comment: (NOTE) Performed At: [facility] [place], [place] [id number] Rush Farmer MD [id number] Performed at [facility], [place] [address], [place], [place] [address]   Hemoglobin A1c     Status: Abnormal   Collection Time: 05/26/17  7:47 AM  Result Value Ref Range   Hgb A1c MFr Bld 5.7 (H) 4.8 - 5.6 %    Comment: (NOTE) Pre diabetes:          5.7%-6.4% Diabetes:              >6.4% Glycemic control for   <7.0% adults with diabetes    Mean Plasma Glucose 116.89 mg/dL    Comment: Performed at [facility] [address], [place], [place] [address]  VITAMIN D 25 Hydroxy (Vit-D Deficiency, Fractures)     Status: Abnormal   Collection Time: 05/26/17  7:47 AM  Result Value Ref Range   Vit D, 25-Hydroxy 12.9 (L) 30.0 - 100.0 ng/mL    Comment: (NOTE) Vitamin D deficiency has been defined by the [place] practice guideline as a level of serum 25-OH vitamin D  less than 20 ng/mL (1,2). The Endocrine Society went on to further define vitamin D insufficiency as a level between 21 and 29 ng/mL (2). 1. IOM (Institute of Medicine). 2010. Dietary reference   intakes for calcium and D. [place]: The   [employer]. 2. Holick MF, Binkley [place], Bischoff-Ferrari HA, et al.   Evaluation, treatment, and prevention of vitamin D   deficiency: an Endocrine Society clinical practice   guideline. JCEM. 2011 Jul; 96(7):1911-30. Performed At: [facility] [place], [place] [id number] Rush Farmer MD [id number] Performed at [facility], [place] [address] [place], [place] [address]   Basic metabolic panel     Status: Abnormal   Collection Time: 05/26/17  7:47 AM  Result Value Ref Range   Sodium 139 135 - 145 mmol/L   Potassium 3.2 (L) 3.5 - 5.1 mmol/L   Chloride 106 101 - 111 mmol/L   CO2 26 22 - 32 mmol/L   Glucose, Bld 160 (H) 65 - 99 mg/dL   BUN 8 6 - 20 mg/dL   Creatinine, Ser 1.08 (H) 0.44 - 1.00 mg/dL   Calcium 8.7 (L) 8.9 - 10.3 mg/dL   GFR  calc non Af Amer >60 >60 mL/min   GFR calc Af Amer >60 >60 mL/min    Comment: (NOTE) The eGFR has been calculated using the CKD EPI equation. This calculation has not been validated in all clinical situations. eGFR's persistently <60 mL/min signify possible Chronic Kidney Disease.    Anion gap 7 5 - 15    Comment: Performed at [facility], [place] [address], [place], [place] [address]    Blood Alcohol level:  Lab Results  Component Value Date   [facility] <10 05/22/2017   ETH <11 83/15/1761    Metabolic Disorder Labs: Lab Results  Component Value Date   HGBA1C 5.7 (H) 05/26/2017   MPG 116.89 05/26/2017   Lab Results  Component Value Date   PROLACTIN 23.7 (H) 05/26/2017   Lab Results  Component Value Date   CHOL 118 05/26/2017   TRIG 191 (H) 05/26/2017   HDL 24 (L) 05/26/2017   CHOLHDL 4.9 05/26/2017   VLDL 38 05/26/2017    LDLCALC 56 05/26/2017   LDLCALC 153 (H) 03/19/2016    Physical Findings: AIMS: Facial and Oral Movements Muscles of Facial Expression: None, normal Lips and Perioral Area: None, normal Jaw: None, normal Tongue: None, normal,Extremity Movements Upper (arms, wrists, hands, fingers): None, normal Lower (legs, knees, ankles, toes): None, normal, Trunk Movements Neck, shoulders, hips: None, normal, Overall Severity Severity of abnormal movements (highest score from questions above): None, normal Incapacitation due to abnormal movements: None, normal Patient's awareness of abnormal movements (rate only patient's report): No Awareness, Dental Status Current problems with teeth and/or dentures?: No Does patient usually wear dentures?: No  CIWA:    COWS:     Musculoskeletal: Strength & Muscle Tone: within normal limits Gait & Station: normal Patient leans: N/A  Psychiatric Specialty Exam: Physical Exam  ROS denies headache, no chest pain, no shortness of breath, no vomiting, reports vaginal discharge, some pruritus   Blood pressure (!) 145/120, pulse 89, temperature 97.6 F (36.4 C), temperature source Oral, resp. rate (!) 24, height 5' 6" (1.676 m), weight (!) 162.4 kg (358 lb).Body mass index is 57.78 kg/m.   Repeat BP 130/75 at 6,30 PM   General Appearance: improving grooming   Eye Contact:  Good  Speech:  Normal Rate  Volume:  Normal  Mood:  improving, less depressed   Affect:  more reactive   Thought Process:  Linear and Descriptions of Associations: Intact  Orientation:  Other:  fully alert and attentive   Thought Content:  denies hallucinations, no delusions epxressed   Suicidal Thoughts:  No denies suicidal or self injurious ideations, denies homicidal or violent ideations, contracts for safety on unit   Homicidal Thoughts:  No  Memory:  recent and remote grossly intact   Judgement:  improving   Insight:  improving   Psychomotor Activity:  Normal  Concentration:   Concentration: Good and Attention Span: Good  Recall:  Good  Fund of Knowledge:  Good  Language:  Good  Akathisia:  Negative  Handed:  Right  AIMS (if indicated):     Assets:  Desire for Improvement Resilience  ADL's:  Intact  Cognition:  WNL  Sleep:  Number of Hours: 4.25   Assessment - patient is presenting with gradually improving mood and affect, and reports feeling more sociable and less anhedonic, denies suicidal ideations at this time. Reports ongoing insomnia .  We discussed options- she has taken Seroquel in the past but it was associated with weight gain. She has been  on Trazodone in the past, with no side effects.   Treatment Plan Summary: Treatment plan reviewed as below today 11/15  Daily contact with patient to assess and evaluate symptoms and progress in treatment, Medication management, Plan inpatient treatment and medications as below Encourage group and milieu participation to work on coping skills and symptom reduction Continue Lamictal 25 mgrs QDAY for mood disorder  Continue Abilify 5 mgrs QDAY for mood disorder/psychosis   Continue  Remeron 7.5 mgrs QHS for depression, anxiety, insomnia  Start Trazodone 50 mgrs QHS PRN for insomnia as needed  Start Vitamin D supplementation Continue Neurontin 300 mgrs TID for anxiety, pain Treatment team working on disposition planning options   Jenne Campus, MD 05/27/2017, 8:51 AM   Patient ID: Dorian Pod, female   DOB: 1975-11-24, 41 y.o.   MRN: [id number]

## 2017-05-28 DIAGNOSIS — F3162 Bipolar disorder, current episode mixed, moderate: Secondary | ICD-10-CM

## 2017-05-28 LAB — URINE CULTURE: Culture: >=100000 — AB

## 2017-05-28 MED ORDER — ARIPIPRAZOLE 5 MG PO TABS: 5.0000 mg | ORAL_TABLET | Freq: Every day | ORAL | 0 refills | Status: DC

## 2017-05-28 MED ORDER — GABAPENTIN 300 MG PO CAPS: 300.0000 mg | ORAL_CAPSULE | Freq: Three times a day (TID) | ORAL | 0 refills | Status: DC

## 2017-05-28 MED ORDER — VITAMIN D3 25 MCG (1000 UNIT) PO TABS: 1000.0000 [IU] | ORAL_TABLET | Freq: Every day | ORAL | 0 refills | Status: DC

## 2017-05-28 MED ORDER — TOPIRAMATE 25 MG PO TABS: 25.0000 mg | ORAL_TABLET | Freq: Every evening | ORAL | 0 refills | Status: DC

## 2017-05-28 MED ORDER — MIRTAZAPINE 7.5 MG PO TABS: 7.5000 mg | ORAL_TABLET | Freq: Every day | ORAL | 0 refills | Status: DC

## 2017-05-28 MED ORDER — TRAZODONE HCL 50 MG PO TABS: 50.0000 mg | ORAL_TABLET | Freq: Every evening | ORAL | 0 refills | Status: DC | PRN

## 2017-05-28 MED ORDER — LAMOTRIGINE 25 MG PO TABS: 25.0000 mg | ORAL_TABLET | Freq: Every day | ORAL | 0 refills | Status: DC

## 2017-05-28 NOTE — Progress Notes (Signed)
Patient verbalizes readiness for discharge. Follow up plan explained, AVS, transition record and SRA given along with prescriptions. Sample meds provided. All belongings returned. Patient verbalizes understanding. Denies SI/HI and assures this Clinical research associatewriter she will seek assistance should that change. Patient discharged ambulatory and in stable condition to friend.

## 2017-05-28 NOTE — Progress Notes (Signed)
D: Patient observed up and visible in the milieu. Patient states she is doing okay and depression and anxiety are starting to improve. Patient's affect anxious with congruent mood. Per self inventory and discussions with writer, rates depression at a 2/10, hopelessness at a 0/10 and anxiety at a 2/10. Rates sleep as good, appetite as good, energy as normal and concentration as good.  States goal for today is "going home and working on my plan to continue success at home. Call my daughter and brother." Complaining of muscle spasms in her back which she states robaxin helps manage.   A: Medicated per orders, prn robaxin given for discomfort. Level III obs in place for safety. Emotional support offered and self inventory reviewed. Encouraged completion of Suicide Safety Plan and programming participation. Discussed POC with MD, SW.   R: Patient verbalizes understanding of POC. On reassess, patient reports decreased spasms. Patient denies SI/HI/AVH and remains safe on level III obs. Will continue to monitor closely and make verbal contact frequently. Patient for possible discharge. MD will see and evaluate.

## 2017-05-28 NOTE — Progress Notes (Signed)
Recreation Therapy Notes  Date: 05/28/17 Time: 0930 Location: 300 Hall Dayroom  Group Topic: Stress Management  Goal Area(s) Addresses:  Patient will verbalize importance of using healthy stress management.  Patient will identify positive emotions associated with healthy stress management.   Intervention: Stress Management  Activity :  Authentic Self Meditation.  LRT introduced the stress management technique of meditation.  LRT lead patients in a meditation that allowed them to explore the things that make them who they are.  Education:  Stress Management, Discharge Planning.   Education Outcome: Acknowledges edcuation/In group clarification offered/Needs additional education  Clinical Observations/Feedback: Pt did not attend group.    Caroll RancherMarjette Felicita Nuncio, LRT/CTRS        Caroll RancherLindsay, Jayle Solarz A 05/28/2017 12:30 PM
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
## 2017-05-28 NOTE — Plan of Care (Signed)
Patient is up and participative in the milieu.   Patient verbalizes understanding of information, education provided.

## 2017-05-28 NOTE — [facility] Suicide Risk Assessment (Signed)
[facility]   Principal Problem: Bipolar 1 disorder, depressed [facility](HCC) Discharge Diagnoses:  Patient Active Problem List   Diagnosis Date Noted  . Bipolar 1 disorder, depressed (HCC) [F31.9] 05/24/2017  . Vitamin D deficiency [E55.9] 03/27/2016  . Dyslipidemia (high LDL; low HDL) [E78.5] 03/27/2016  . Elevated serum creatinine [R79.89] 03/27/2016  . Cervical disc disorder with radiculopathy of cervical region [M50.10] 12/20/2014    Total Time spent with patient: 30 minutes  Musculoskeletal: Strength & Muscle Tone: within normal limits Gait & Station: normal Patient leans: N/A  Psychiatric Specialty Exam: ROS no headache, no chest pain, no shortness of breath or dyspnea at room air, no fever, no chills  Blood pressure (!) 117/59, pulse 91, temperature 97.8 F (36.6 C), temperature source Oral, resp. rate 18, height 5\' 6"  (1.676 m), weight (!) 162.4 kg (358 lb).Body mass index is 57.78 kg/m.  General Appearance: improving grooming   Eye Contact::  Good  Speech:  Normal Rate409  Volume:  Normal  Mood:  improved, states she feels better today   Affect:  presents with fuller range of affect, brighter   Thought Process:  Linear and Descriptions of Associations: Intact  Orientation:  Full (Time, Place, and Person)  Thought Content:  denies hallucinations, no delusions, not internally preoccupied   Suicidal Thoughts:  No denies suicidal or self injurious ideations, denies homicidal or violent ideations  Homicidal Thoughts:  No  Memory:  recent and remote grossly intact   Judgement:  Other:  improving   Insight:  improving   Psychomotor Activity:  Normal  Concentration:  Good  Recall:  Good  Fund of Knowledge:Good  Language: Good  Akathisia:  Negative  Handed:  Right  AIMS (if indicated):   no abnormal movements noted or reported   Assets:  Desire for Improvement Resilience  Sleep:  Number of Hours: 4.25  Cognition: WNL  ADL's:  Intact   Mental  Status Per Nursing Assessment::   On Admission:     Demographic Factors:  41 year old female , unemployed, lives with brother  Loss Factors: Family, social stressors, argument with a friend of her brother's who is staying with them  Historical Factors: History of Bipolar Disorder diagnosis, history of Borderline Personality Disorder diagnosis. Prior suicide attempt and psychiatric admission in 2014.   Risk Reduction Factors:   Sense of responsibility to family, Living with another person, especially a relative and Positive coping skills or problem solving skills  Continued Clinical Symptoms:  Patient reports she is feeling much better today and expresses readiness for discharge . States she slept better last night ( insomnia had been one of her ongoing concerns). At this time presents with improved mood , fuller range of affect, no thought disorder, no suicidal or self injurious ideations, no homicidal or violent ideations, no psychotic symptoms, and is future oriented. Denies medication side effects. We reviewed medication side effects, to include risk of severe rash on Lamictal, movement disorders on Abilify Behavior on unit has been calm,in good control, and is pleasant on approach.    Cognitive Features That Contribute To Risk:  No gross cognitive deficits noted upon discharge. Is alert , attentive, and oriented x 3   Suicide Risk:  Mild:  Suicidal ideation of limited frequency, intensity, duration, and specificity.  There are no identifiable plans, no associated intent, mild dysphoria and related symptoms, good self-control (both objective and subjective assessment), few other risk factors, and identifiable protective factors, including available and accessible social support.  Follow-up Information    [facility] Follow up on 05/31/2017.   Why:  [facility]. [facility]. You will be  enrolling for [employer] funding. Please call the office if you need to cancel or reschedule your appointment. Thank you. Contact information: [address] [place] [address] [phone]           Plan Of Care/Follow-up recommendations:  Activity:  as tolerated  Diet:  heart healthy Tests:  NA Other:  See below  Patient is requesting discharge and there are no current grounds for involuntary commitment She is leaving unit in good spirits  Plans to return home Plans to follow up as above  Also has established PCP for ongoing management of medical issues as needed- Dr. Metta ClinesNolton.  Craige CottaFernando A Cobos, MD 05/28/2017, 12:38 PM

## 2017-06-04 ENCOUNTER — Telehealth (HOSPITAL_COMMUNITY): Payer: Self-pay | Admitting: Psychiatry

## 2017-06-04 NOTE — Telephone Encounter (Signed)
Received a phone call from patient, who was [facility]. She states that she has not been able to afford abifliy. [facility] as she will not see a provider until 12/12. She denies SI, HI.   She is informed that this note Clinical research associatewriter will relay this information to the provider she was evaluated during her admission for further discussion. She is advised to contact us back if she does not hear back from anybody next week.

## 2018-07-29 ENCOUNTER — Other Ambulatory Visit (HOSPITAL_COMMUNITY): Payer: Self-pay | Admitting: Family Medicine

## 2018-07-29 ENCOUNTER — Ambulatory Visit (HOSPITAL_COMMUNITY)
Admission: RE | Admit: 2018-07-29 | Discharge: 2018-07-29 | Disposition: A | Discharge status: Home / Self Care | Payer: Medicaid Other | Source: Ambulatory Visit | Attending: Family Medicine | Admitting: Family Medicine

## 2018-07-29 ENCOUNTER — Encounter (HOSPITAL_COMMUNITY): Payer: Self-pay

## 2018-07-29 DIAGNOSIS — J209 Acute bronchitis, unspecified: Secondary | ICD-10-CM

## 2018-11-01 ENCOUNTER — Encounter (HOSPITAL_COMMUNITY): Payer: Self-pay | Admitting: *Deleted

## 2018-11-01 ENCOUNTER — Emergency Department (HOSPITAL_COMMUNITY)
Admission: EM | Admit: 2018-11-01 | Discharge: 2018-11-02 | Disposition: A | Discharge status: Home / Self Care | Payer: Medicaid Other | Attending: Emergency Medicine | Admitting: Emergency Medicine

## 2018-11-01 ENCOUNTER — Emergency Department (HOSPITAL_COMMUNITY): Payer: Medicaid Other

## 2018-11-01 ENCOUNTER — Other Ambulatory Visit: Payer: Self-pay

## 2018-11-01 DIAGNOSIS — Z79899 Other long term (current) drug therapy: Secondary | ICD-10-CM | POA: Insufficient documentation

## 2018-11-01 DIAGNOSIS — E669 Obesity, unspecified: Secondary | ICD-10-CM | POA: Insufficient documentation

## 2018-11-01 DIAGNOSIS — R072 Precordial pain: Secondary | ICD-10-CM

## 2018-11-01 DIAGNOSIS — R0602 Shortness of breath: Secondary | ICD-10-CM | POA: Diagnosis not present

## 2018-11-01 DIAGNOSIS — R079 Chest pain, unspecified: Secondary | ICD-10-CM | POA: Diagnosis present

## 2018-11-01 DIAGNOSIS — I10 Essential (primary) hypertension: Secondary | ICD-10-CM | POA: Diagnosis not present

## 2018-11-01 DIAGNOSIS — E119 Type 2 diabetes mellitus without complications: Secondary | ICD-10-CM | POA: Insufficient documentation

## 2018-11-01 DIAGNOSIS — J9801 Acute bronchospasm: Secondary | ICD-10-CM | POA: Insufficient documentation

## 2018-11-01 DIAGNOSIS — F17210 Nicotine dependence, cigarettes, uncomplicated: Secondary | ICD-10-CM | POA: Diagnosis not present

## 2018-11-01 HISTORY — DX: Type 2 diabetes mellitus without complications: E11.9

## 2018-11-01 HISTORY — DX: Other intervertebral disc degeneration, lumbar region: M51.36

## 2018-11-01 HISTORY — DX: Other intervertebral disc degeneration, lumbar region without mention of lumbar back pain or lower extremity pain: M51.369

## 2018-11-01 LAB — BASIC METABOLIC PANEL
Anion gap: 8 (ref 5–15)
BUN: 10 mg/dL (ref 6–20)
CO2: 23 mmol/L (ref 22–32)
Calcium: 8.9 mg/dL (ref 8.9–10.3)
Chloride: 105 mmol/L (ref 98–111)
Creatinine, Ser: 1.03 mg/dL — ABNORMAL HIGH (ref 0.44–1.00)
GFR calc Af Amer: >60 mL/min (ref >60)
GFR calc non Af Amer: >60 mL/min (ref >60)
Glucose, Bld: 118 mg/dL — ABNORMAL HIGH (ref 70–99)
Potassium: 3.8 mmol/L (ref 3.5–5.1)
Sodium: 136 mmol/L (ref 135–145)

## 2018-11-01 LAB — CBC
HCT: 36 % (ref 36.0–46.0)
Hemoglobin: 11.6 g/dL — ABNORMAL LOW (ref 12.0–15.0)
MCH: 36.3 pg — ABNORMAL HIGH (ref 26.0–34.0)
MCHC: 32.2 g/dL (ref 30.0–36.0)
MCV: 112.5 fL — ABNORMAL HIGH (ref 80.0–100.0)
Platelets: 282 10*3/uL (ref 150–400)
RBC: 3.2 MIL/uL — ABNORMAL LOW (ref 3.87–5.11)
RDW: 16.3 % — ABNORMAL HIGH (ref 11.5–15.5)
WBC: 6.5 10*3/uL (ref 4.0–10.5)
nRBC: 0 % (ref 0.0–0.2)

## 2018-11-01 LAB — TROPONIN I: Troponin I: <0.03 ng/mL (ref <0.03)

## 2018-11-01 LAB — POC URINE PREG, ED: Preg Test, Ur: NEGATIVE

## 2018-11-01 LAB — BRAIN NATRIURETIC PEPTIDE: B Natriuretic Peptide: 43 pg/mL (ref 0.0–100.0)

## 2018-11-01 MED ORDER — MIDAZOLAM HCL 2 MG/2ML IJ SOLN
1.0000 mg | Freq: Once | INTRAMUSCULAR | Status: AC
  Administered 2018-11-01: 23:00:00 1 mg via INTRAVENOUS
  Filled 2018-11-01: qty 2

## 2018-11-01 MED ORDER — SODIUM CHLORIDE 0.9% FLUSH
3.0000 mL | Freq: Once | INTRAVENOUS | Status: AC
  Administered 2018-11-01: 3 mL via INTRAVENOUS

## 2018-11-01 MED ORDER — METHYLPREDNISOLONE SODIUM SUCC 125 MG IJ SOLR
125.0000 mg | Freq: Once | INTRAMUSCULAR | Status: AC
  Administered 2018-11-01: 125 mg via INTRAVENOUS
  Filled 2018-11-01: qty 2

## 2018-11-01 MED ORDER — ASPIRIN 81 MG PO CHEW
324.0000 mg | CHEWABLE_TABLET | Freq: Once | ORAL | Status: AC
  Administered 2018-11-01: 324 mg via ORAL
  Filled 2018-11-01: qty 4

## 2018-11-01 MED ORDER — MAGNESIUM SULFATE 2 GM/50ML IV SOLN
2.0000 g | Freq: Once | INTRAVENOUS | Status: AC
  Administered 2018-11-01: 23:00:00 2 g via INTRAVENOUS
  Filled 2018-11-01: qty 50

## 2018-11-01 MED ORDER — ALBUTEROL SULFATE HFA 108 (90 BASE) MCG/ACT IN AERS
6.0000 | INHALATION_SPRAY | Freq: Once | RESPIRATORY_TRACT | Status: AC
  Administered 2018-11-01: 23:00:00 6 via RESPIRATORY_TRACT
  Filled 2018-11-01: qty 6.7

## 2018-11-01 MED ORDER — AEROCHAMBER PLUS FLO-VU LARGE MISC
1.0000 | Freq: Once | Status: DC
  Filled 2018-11-01: qty 1

## 2018-11-01 NOTE — ED Triage Notes (Signed)
Pt c/o left side chest pain and back pain; pt has had nausea and sob; pt has productive cough

## 2018-11-01 NOTE — ED Provider Notes (Addendum)
[facility] EMERGENCY DEPARTMENT Provider Note   CSN: [id number] Arrival date & time: 11/01/18  2202    History   Chief Complaint Chief Complaint  Patient presents with  . Chest Pain    HPI [name] is a 43 y.o. female.  HPI: A 43 year old patient with a history of treated diabetes, hypertension, hypercholesterolemia and obesity presents for evaluation of chest pain. Initial onset of pain was approximately 3-6 hours ago. The patient's chest pain is sharp and is worse with exertion. The patient's chest pain is middle- or left-sided, is not well-localized, is not described as heaviness/pressure/tightness and does radiate to the arms/jaw/neck. The patient does not complain of nausea and denies diaphoresis. The patient has no history of stroke, has no history of peripheral artery disease, has not smoked in the past 90 days and has no relevant family history of coronary artery disease (first degree relative at less than age 29).   Patient has history of asthma, bipolar disorder.  She reports that her shortness of breath is worse with exertion.  Chest pain started last night, but today the chest pain started radiating to her back.  She does not think that her chest pain is worse with exertion or with deep inspiration.  HPI  Past Medical History:  Diagnosis Date  . Asthma   . Bipolar disorder (HCC)   . Concussion   . DDD (degenerative disc disease), lumbar   . Diabetes mellitus without complication (HCC)   . Dyslipidemia (high LDL; low HDL) 03/27/2016  . Dyspnea    with exertion  . Elevated serum creatinine 03/27/2016  . Fibromyalgia   . GERD (gastroesophageal reflux disease)   . Headache(784.0)   . Hypertension   . Mental disorder    anxiety  . Pneumonia 2015  . Vitamin D deficiency 03/27/2016    Patient Active Problem List   Diagnosis Date Noted  . Bipolar 1 disorder, mixed, moderate (HCC)   . Bipolar 1 disorder, depressed (HCC) 05/24/2017  . Vitamin D deficiency  03/27/2016  . Dyslipidemia (high LDL; low HDL) 03/27/2016  . Elevated serum creatinine 03/27/2016  . Cervical disc disorder with radiculopathy of cervical region 12/20/2014    Past Surgical History:  Procedure Laterality Date  . CYST REMOVAL HAND Left   . ESOPHAGEAL DILATION    . FOOT SURGERY Right    'knot removed"  . MULTIPLE EXTRACTIONS WITH ALVEOLOPLASTY N/A 05/29/2016   Procedure: MULTIPLE EXTRACTIONS;  Surgeon: Ocie Doyne, DDS;  Location: [facility] OR;  Service: Oral Surgery;  Laterality: N/A;  . WISDOM TOOTH EXTRACTION       OB History    Gravida  3   Para  1   Term  1   Preterm      AB  2   Living  1     SAB  2   TAB      Ectopic      Multiple      Live Births               Home Medications    Prior to Admission medications   Medication Sig Start Date End Date Taking? Authorizing Provider  ARIPiprazole (ABILIFY) 5 MG tablet Take 1 tablet (5 mg total) daily by mouth. 05/29/17   Oneta Rack, NP  atorvastatin (LIPITOR) 20 MG tablet Take 20 mg by mouth every evening.    [provider]  cholecalciferol (VITAMIN D) 1000 units tablet Take 1 tablet (1,000 Units total) daily by mouth.  05/29/17   Oneta Rack, NP  gabapentin (NEURONTIN) 300 MG capsule Take 1 capsule (300 mg total) 3 (three) times daily by mouth. 05/28/17   Oneta Rack, NP  lamoTRIgine (LAMICTAL) 25 MG tablet Take 1 tablet (25 mg total) daily by mouth. 05/29/17   Oneta Rack, NP  mirtazapine (REMERON) 7.5 MG tablet Take 1 tablet (7.5 mg total) at bedtime by mouth. 05/28/17   Oneta Rack, NP  topiramate (TOPAMAX) 25 MG tablet Take 1 tablet (25 mg total) every evening by mouth. 05/28/17   Oneta Rack, NP  traZODone (DESYREL) 50 MG tablet Take 1 tablet (50 mg total) at bedtime as needed by mouth for sleep. 05/28/17   Oneta Rack, NP    Family History Family History  Problem Relation Age of Onset  . Cancer Maternal Grandmother        lung, back  . Diabetes  Paternal Grandmother   . Heart disease Paternal Grandmother   . Diabetes Paternal Grandfather   . Heart disease Paternal Grandfather   . Stroke Paternal Grandfather     Social History Social History   Tobacco Use  . Smoking status: Current Every Day Smoker    Packs/day: 0.50    Years: 10.00    Pack years: 5.00    Types: Cigarettes  . Smokeless tobacco: Never Used  Substance Use Topics  . Alcohol use: Not Currently    Comment: rare  . Drug use: No     Allergies   Penicillins; Sulfa antibiotics; Latex; and Other   Review of Systems Review of Systems  Constitutional: Positive for activity change.  Respiratory: Positive for shortness of breath.   Cardiovascular: Positive for chest pain.  Gastrointestinal: Negative for nausea and vomiting.  Allergic/Immunologic: Negative for immunocompromised state.  Hematological: Does not bruise/bleed easily.  All other systems reviewed and are negative.    Physical Exam Updated Vital Signs BP 124/88   Pulse 91   Resp 14   Ht 5\' 6"  (1.676 m)   Wt (!) 172.4 kg   LMP 10/25/2018   SpO2 96%   BMI 61.33 kg/m   Physical Exam Vitals signs and nursing note reviewed.  Constitutional:      Appearance: She is well-developed.  HENT:     Head: Normocephalic and atraumatic.  Neck:     Musculoskeletal: Normal range of motion and neck supple.  Cardiovascular:     Rate and Rhythm: Normal rate.     Heart sounds: Normal heart sounds.  Pulmonary:     Effort: Tachypnea present.     Breath sounds: Wheezing present.  Abdominal:     General: Bowel sounds are normal.  Skin:    General: Skin is warm and dry.  Neurological:     Mental Status: She is alert and oriented to person, place, and time.      ED Treatments / Results  Labs (all labs ordered are listed, but only abnormal results are displayed) Labs Reviewed  BASIC METABOLIC PANEL - Abnormal; Notable for the following components:      Result Value   Glucose, Bld 118 (*)     Creatinine, Ser 1.03 (*)    All other components within normal limits  CBC - Abnormal; Notable for the following components:   RBC 3.20 (*)    Hemoglobin 11.6 (*)    MCV 112.5 (*)    MCH 36.3 (*)    RDW 16.3 (*)    All other components within normal limits  TROPONIN I  BRAIN NATRIURETIC PEPTIDE  D-DIMER, QUANTITATIVE (NOT AT [facility])  POC URINE PREG, ED  I-STAT BETA HCG BLOOD, ED ([facility], [facility], [facility] ONLY)    EKG EKG Interpretation  Date/Time:  Tuesday November 01 2018 22:28:05 EDT Ventricular Rate:  93 PR Interval:    QRS Duration: 113 QT Interval:  378 QTC Calculation: 471 R Axis:   61 Text Interpretation:  Sinus rhythm Borderline intraventricular conduction delay Low voltage, precordial leads No acute changes No significant change since last tracing Confirmed by Derwood KaplanNanavati, [place] Esch [phone]([id number]) on 11/01/2018 11:03:09 PM   Radiology No results found.  Procedures Procedures (including critical care time)  Medications Ordered in ED Medications  AeroChamber Plus Flo-Vu Large MISC 1 each (has no administration in time range)  magnesium sulfate IVPB 2 g 50 mL (2 g Intravenous New Bag/Given 11/01/18 2255)  methylPREDNISolone sodium succinate (SOLU-MEDROL) 125 mg/2 mL injection 125 mg (has no administration in time range)  sodium chloride flush (NS) 0.9 % injection 3 mL (3 mLs Intravenous Given 11/01/18 2256)  albuterol (VENTOLIN HFA) 108 (90 Base) MCG/ACT inhaler 6 puff (6 puffs Inhalation Given 11/01/18 2244)  aspirin chewable tablet 324 mg (324 mg Oral Given 11/01/18 2242)  midazolam (VERSED) injection 1 mg (1 mg Intravenous Given 11/01/18 2245)     Initial Impression / Assessment and Plan / ED Course  I have reviewed the triage vital signs and the nursing notes.  Pertinent labs & imaging results that were available during my care of the patient were reviewed by me and considered in my medical decision making (see chart for details).     HEAR Score: 3  Differential diagnosis includes: ACS  syndrome Aortic dissection CHF exacerbation Valvular disorder Myocarditis Pericardial effusion / tamponade Pneumonia Pleural effusion / Pulmonary edema PE Pneumothorax Musculoskeletal pain PUD / Gastritis / Esophagitis Esophageal spasm  Patient comes in a chief complaint of chest pain.  Her chest pain is left-sided and radiating to her shoulder and back.  Chest pain started yesterday, with radiation of it started in the evening. She is also reporting exertional shortness of breath and is notably tachypneic.  Patient has history of asthma and anxiety.  I gave her some Versed, as I think there is some anxiety component playing into her tachypnea, but she is also wheezing and we will give her breathing treatments.  Special diagnosis does include ACS.  Patient has multiple ACS risk factors.  Additionally I am also concerned for PE.  Patient's care will be signed out to Dr. Bebe ShaggyWickline. D-dimer is pending at this time. If patient's dimer is positive then she will need CT PE.  If patient's work-up in the ED is negative, including delta troponin and PE work-up, then Dr. Bebe ShaggyWickline will reassess for discharge with strict precautions vs admission for asthma/ACS r/o.   Geryl RankinsStacey N Dauenhauer was evaluated in Emergency Department on 11/01/2018 for the symptoms described in the history of present illness. She was evaluated in the context of the global COVID-19 pandemic, which necessitated consideration that the patient might be at risk for infection with the SARS-CoV-2 virus that causes COVID-19. Institutional protocols and algorithms that pertain to the evaluation of patients at risk for COVID-19 are in a state of rapid change based on information released by regulatory bodies including the CDC and federal and state organizations. These policies and algorithms were followed during the patient's care in the ED.    Final Clinical Impressions(s) / ED Diagnoses   Final diagnoses:  SOB (shortness of breath)  ED Discharge Orders    None       Derwood Kaplan, MD 11/01/18 4540    Derwood Kaplan, MD 11/01/18 [phone]

## 2018-11-02 LAB — D-DIMER, QUANTITATIVE (NOT AT ARMC): D-Dimer, Quant: 0.47 ug/mL-FEU (ref 0.00–0.50)

## 2018-11-02 MED ORDER — PREDNISONE 50 MG PO TABS: ORAL_TABLET | ORAL | 0 refills | Status: DC

## 2018-11-02 NOTE — Discharge Instructions (Addendum)

## 2018-11-02 NOTE — ED Provider Notes (Signed)
Assumed care in signout to follow-up on labs. D-dimer and troponin negative.  Patient reports she is feeling well.  On My assessment she is in no acute distress, speaking comfortably, pulse ox is 95% on room air Discussed repeat troponin and monitoring.  Patient request discharge home.  We will add on prednisone for her wheezing, and she will continue albuterol.  She was encouraged to quit smoking. She understands limitations of one-time troponin testing, but otherwise testing thus far is unremarkable. Discussed Strict return precautions   Zadie Rhine, MD 11/02/18 [phone]

## 2019-02-09 ENCOUNTER — Other Ambulatory Visit: Payer: Medicaid Other | Admitting: Adult Health

## 2019-02-24 ENCOUNTER — Other Ambulatory Visit (HOSPITAL_COMMUNITY): Payer: Self-pay | Admitting: Family Medicine

## 2019-02-24 ENCOUNTER — Other Ambulatory Visit: Payer: Self-pay

## 2019-02-24 ENCOUNTER — Ambulatory Visit (HOSPITAL_COMMUNITY)
Admission: RE | Admit: 2019-02-24 | Discharge: 2019-02-24 | Disposition: A | Discharge status: Home / Self Care | Payer: Medicaid Other | Source: Ambulatory Visit | Attending: Family Medicine | Admitting: Family Medicine

## 2019-02-24 DIAGNOSIS — M25562 Pain in left knee: Secondary | ICD-10-CM | POA: Diagnosis present

## 2019-04-25 ENCOUNTER — Other Ambulatory Visit: Payer: Medicaid Other | Admitting: Adult Health

## 2019-05-02 ENCOUNTER — Other Ambulatory Visit: Payer: Self-pay

## 2019-05-02 ENCOUNTER — Encounter: Payer: Self-pay | Admitting: Orthopaedic Surgery

## 2019-05-02 ENCOUNTER — Ambulatory Visit: Payer: Medicaid Other | Admitting: Orthopaedic Surgery

## 2019-05-02 VITALS — BP 114/78 | HR 103 | Temp 96.4°F | Ht 66.0 in | Wt >= 6400 oz

## 2019-05-02 DIAGNOSIS — Z7901 Long term (current) use of anticoagulants: Secondary | ICD-10-CM

## 2019-05-02 DIAGNOSIS — M25562 Pain in left knee: Secondary | ICD-10-CM | POA: Diagnosis not present

## 2019-05-02 DIAGNOSIS — F17210 Nicotine dependence, cigarettes, uncomplicated: Secondary | ICD-10-CM | POA: Diagnosis not present

## 2019-05-02 DIAGNOSIS — Z6841 Body Mass Index (BMI) 40.0 and over, adult: Secondary | ICD-10-CM | POA: Diagnosis not present

## 2019-05-02 DIAGNOSIS — G8929 Other chronic pain: Secondary | ICD-10-CM

## 2019-05-02 NOTE — Patient Instructions (Signed)

## 2019-05-02 NOTE — Progress Notes (Signed)
Subjective:    Patient ID: Judith RankinsStacey N [name], female    DOB: 01-16-1976, 43 y.o.   MRN: [id number]  HPI She has had knee pain during the summer. She saw Dr. Sudie BaileyKnowlton and had x-rays of the left knee on 02-24-2019 showing no acute problem.  Her left knee has swollen, pops and now gives way. She has no trauma. She has no redness. She has no numbness.  She has tried Aleve, rest, ice, heat with no help.  It is getting worse.     Review of Systems  Constitutional: Positive for activity change.  Respiratory: Positive for shortness of breath.   Musculoskeletal: Positive for arthralgias, gait problem and joint swelling.  Neurological: Positive for headaches.  All other systems reviewed and are negative.  For Review of Systems, all other systems reviewed and are negative.  The following is a summary of the past history medically, past history surgically, known current medicines, social history and family history.  This information is gathered electronically by the computer from prior information and documentation.  I review this each visit and have found including this information at this point in the chart is beneficial and informative.   Past Medical History:  Diagnosis Date  . Asthma   . Bipolar disorder (HCC)   . Concussion   . DDD (degenerative disc disease), lumbar   . Diabetes mellitus without complication (HCC)   . Dyslipidemia (high LDL; low HDL) 03/27/2016  . Dyspnea    with exertion  . Elevated serum creatinine 03/27/2016  . Fibromyalgia   . GERD (gastroesophageal reflux disease)   . Headache(784.0)   . Hypertension   . Mental disorder    anxiety  . Pneumonia 2015  . Vitamin D deficiency 03/27/2016    Past Surgical History:  Procedure Laterality Date  . CYST REMOVAL HAND Left   . ESOPHAGEAL DILATION    . FOOT SURGERY Right    'knot removed"  . MULTIPLE EXTRACTIONS WITH ALVEOLOPLASTY N/A 05/29/2016   Procedure: MULTIPLE EXTRACTIONS;  Surgeon: Ocie DoyneScott Jensen, DDS;  Location: [facility]  OR;  Service: Oral Surgery;  Laterality: N/A;  . WISDOM TOOTH EXTRACTION      Current Outpatient Medications on File Prior to Visit  Medication Sig Dispense Refill  . ARIPiprazole (ABILIFY) 5 MG tablet Take 1 tablet (5 mg total) daily by mouth. 30 tablet 0  . atorvastatin (LIPITOR) 20 MG tablet Take 20 mg by mouth every evening.    . cholecalciferol (VITAMIN D) 1000 units tablet Take 1 tablet (1,000 Units total) daily by mouth. 30 tablet 0  . gabapentin (NEURONTIN) 300 MG capsule Take 1 capsule (300 mg total) 3 (three) times daily by mouth. 90 capsule 0  . lamoTRIgine (LAMICTAL) 25 MG tablet Take 1 tablet (25 mg total) daily by mouth. 30 tablet 0  . mirtazapine (REMERON) 7.5 MG tablet Take 1 tablet (7.5 mg total) at bedtime by mouth. 30 tablet 0  . predniSONE (DELTASONE) 50 MG tablet One tablet PO daily for 4 days 4 tablet 0  . topiramate (TOPAMAX) 25 MG tablet Take 1 tablet (25 mg total) every evening by mouth. 30 tablet 0  . traZODone (DESYREL) 50 MG tablet Take 1 tablet (50 mg total) at bedtime as needed by mouth for sleep. 30 tablet 0   No current facility-administered medications on file prior to visit.     Social History   Socioeconomic History  . Marital status: Single    Spouse name: Not on file  . Number of  children: Not on file  . Years of education: Not on file  . Highest education level: Not on file  Occupational History  . Not on file  Social Needs  . Financial resource strain: Not on file  . Food insecurity    Worry: Not on file    Inability: Not on file  . Transportation needs    Medical: Not on file    Non-medical: Not on file  Tobacco Use  . Smoking status: Current Every Day Smoker    Packs/day: 0.50    Years: 10.00    Pack years: 5.00    Types: Cigarettes  . Smokeless tobacco: Never Used  Substance and Sexual Activity  . Alcohol use: Not Currently    Comment: rare  . Drug use: No  . Sexual activity: Not Currently    Partners: Male    Birth  control/protection: None, Abstinence  Lifestyle  . Physical activity    Days per week: Not on file    Minutes per session: Not on file  . Stress: Not on file  Relationships  . Social Herbalist on phone: Not on file    Gets together: Not on file    Attends religious service: Not on file    Active member of club or organization: Not on file    Attends meetings of clubs or organizations: Not on file    Relationship status: Not on file  . Intimate partner violence    Fear of current or ex partner: Not on file    Emotionally abused: Not on file    Physically abused: Not on file    Forced sexual activity: Not on file  Other Topics Concern  . Not on file  Social History Narrative  . Not on file    Family History  Problem Relation Age of Onset  . Cancer Maternal Grandmother        lung, back  . Diabetes Paternal Grandmother   . Heart disease Paternal Grandmother   . Diabetes Paternal Grandfather   . Heart disease Paternal Grandfather   . Stroke Paternal Grandfather     BP 114/78   Pulse (!) 103   Temp (!) 96.4 F (35.8 C)   Ht 5\' 6"  (1.676 m)   Wt (!) 413 lb (187.3 kg)   BMI 66.66 kg/m   Body mass index is 66.66 kg/m.     Objective:   Physical Exam Vitals signs reviewed.  Constitutional:      Appearance: She is well-developed.  HENT:     Head: Normocephalic and atraumatic.  Eyes:     Conjunctiva/sclera: Conjunctivae normal.     Pupils: Pupils are equal, round, and reactive to light.  Neck:     Musculoskeletal: Normal range of motion and neck supple.  Cardiovascular:     Rate and Rhythm: Normal rate and regular rhythm.  Pulmonary:     Effort: Pulmonary effort is normal.  Abdominal:     Palpations: Abdomen is soft.  Musculoskeletal:     Left knee: She exhibits decreased range of motion and swelling. Tenderness found. Medial joint line tenderness noted.       Legs:  Skin:    General: Skin is warm and dry.  Neurological:     Mental Status: She  is alert and oriented to person, place, and time.     Cranial Nerves: No cranial nerve deficit.     Motor: No abnormal muscle tone.     Coordination: Coordination normal.  Deep Tendon Reflexes: Reflexes are normal and symmetric. Reflexes normal.  Psychiatric:        Behavior: Behavior normal.        Thought Content: Thought content normal.        Judgment: Judgment normal.     I have reviewed the x-rays of 02-24-2019.      Assessment & Plan:   Encounter Diagnoses  Name Primary?  . Chronic pain of left knee Yes  . Nicotine dependence, cigarettes, uncomplicated   . Anticoagulated on Coumadin   . Body mass index 60.0-69.9, adult (HCC)   . Morbid obesity (HCC)    I will get MRI of the left knee.  PROCEDURE NOTE:  The patient requests injections of the left knee , verbal consent was obtained.  The left knee was prepped appropriately after time out was performed.   Sterile technique was observed and injection of 1 cc of Depo-Medrol 40 mg with several cc's of plain xylocaine. Anesthesia was provided by ethyl chloride and a 20-gauge needle was used to inject the knee area. The injection was tolerated well.  A band aid dressing was applied.  The patient was advised to apply ice later today and tomorrow to the injection sight as needed.  Return after the MRI in two weeks.  I am concerned about medial meniscus tear. She may need arthroscopy.  Call if any problem.  Precautions discussed.   Electronically Signed Darreld Mclean, MD 10/20/202010:44 AM

## 2019-05-10 ENCOUNTER — Other Ambulatory Visit: Payer: Self-pay

## 2019-05-10 ENCOUNTER — Telehealth: Payer: Self-pay | Admitting: Gastroenterology

## 2019-05-10 ENCOUNTER — Ambulatory Visit: Payer: Medicaid Other | Admitting: Gastroenterology

## 2019-05-10 ENCOUNTER — Encounter: Payer: Self-pay | Admitting: Gastroenterology

## 2019-05-10 DIAGNOSIS — R1310 Dysphagia, unspecified: Secondary | ICD-10-CM

## 2019-05-10 DIAGNOSIS — K5903 Drug induced constipation: Secondary | ICD-10-CM

## 2019-05-10 DIAGNOSIS — D649 Anemia, unspecified: Secondary | ICD-10-CM

## 2019-05-10 DIAGNOSIS — R1013 Epigastric pain: Secondary | ICD-10-CM | POA: Diagnosis not present

## 2019-05-10 DIAGNOSIS — K219 Gastro-esophageal reflux disease without esophagitis: Secondary | ICD-10-CM

## 2019-05-10 DIAGNOSIS — R1319 Other dysphagia: Secondary | ICD-10-CM

## 2019-05-10 DIAGNOSIS — K5900 Constipation, unspecified: Secondary | ICD-10-CM | POA: Insufficient documentation

## 2019-05-10 MED ORDER — SUCRALFATE 1 GM/10ML PO SUSP: 1.0000 g | Freq: Three times a day (TID) | ORAL | 1 refills | Status: DC

## 2019-05-10 MED ORDER — LUBIPROSTONE 24 MCG PO CAPS: 24.0000 ug | ORAL_CAPSULE | Freq: Two times a day (BID) | ORAL | 3 refills | Status: DC

## 2019-05-10 NOTE — Patient Instructions (Addendum)
1. Upper endoscopy as scheduled.  Please see separate instructions. 2. Add Carafate suspension, 4 times daily before meals and at bedtime. 3. Continue omeprazole 40 mg twice daily before meals. 4. Amitiza 24 mcg 1-2 times daily with food for constipation. 5. Call with change in symptoms.  If worsening symptoms, unable to eat, worsening pain, go to the emergency department.

## 2019-05-10 NOTE — Telephone Encounter (Signed)
Pt returned call. Pt is going to update labs this Friday. Labs placed and released.

## 2019-05-10 NOTE — Assessment & Plan Note (Signed)
Recent onset in the setting of hydrocodone use.  Start Amitiza 24 mcg once to twice daily as needed.

## 2019-05-10 NOTE — H&P (View-Only) (Signed)
Primary Care Physician:  Lemmie Evens, MD  Primary Gastroenterologist:  Garfield Cornea, MD   Chief Complaint  Patient presents with  . Dysphagia    had esoph stretched back in 2006. reports having trouble swallowing again. Unable to get food to go down. Feels like she "forgot how to swallow" x 1 year. sometimes feels like she is swallowing a rock, lower esophagus pain x 1 week    HPI:  [name] is a 43 y.o. female here at the request of Dr. Karie Kirks for further evaluation of Dysphagia.  Past medical history significant for anxiety, diabetes, bipolar disorder, asthma, morbid obesity.  Patient has a history of remote EGD in June 2006 by Dr. Laural Golden, small hiatal hernia otherwise normal exam. Esophagus dilated due to history of dysphagia.  She has gained 60 pounds in the past 2 years, 27 of those pounds since April of this year.  She states she only leaves her house to go to the doctor.  She has severe social anxiety.  She has 2 types of dysphagia complaints.  For the past 1 year she feels like she cannot swallow, does not remember how the swallowing.  Second complaint, new dysphagia occurring over the past couple of weeks.  Symptoms began after bending over, she had acute onset epigastric pain followed by difficulty eating solid food.  She admits to living on [facility] most of the time.  She does not try to eat solid food until in the evenings, typically will have 10 chicken nuggets at nighttime.  Now when she tries to eat she developed substernal pain, burning.  Feels like she swallowed a rock.  Symptoms occur after eating only 3 chicken nuggets.  Feels like everything is sitting in her chest. No pill dysphagia. Was taking prednisone, only new medication. Also with frequently develops cough/choking/gagging and then vomits fizzy bubbles. Happens frequently but not while eating. Several times per week. Happens while upright.   Has asthma uses inhalers. Omeprazole BID, has been on since  2008. If eats greasy pizza, will get heartburn. Hurts in epigastric region. BM usually good. Started pain medication recently for knee pain. Now some constipation. Stools are hard. No melena, brbpr.    Current Outpatient Medications  Medication Sig Dispense Refill  . albuterol (VENTOLIN HFA) 108 (90 Base) MCG/ACT inhaler Inhale 2 puffs into the lungs every 4 (four) hours as needed for wheezing or shortness of breath.    Marland Kitchen atorvastatin (LIPITOR) 20 MG tablet Take 20 mg by mouth every evening.    Marland Kitchen atorvastatin (LIPITOR) 20 MG tablet Take 20 mg by mouth daily.    . busPIRone (BUSPAR) 10 MG tablet Take 10 mg by mouth 3 (three) times daily.    . fluticasone (FLOVENT HFA) 220 MCG/ACT inhaler Inhale 1 puff into the lungs 2 (two) times daily.    Marland Kitchen gabapentin (NEURONTIN) 400 MG capsule Take 400 mg by mouth 3 (three) times daily.    . hydrochlorothiazide (HYDRODIURIL) 25 MG tablet Take 25 mg by mouth daily.    Marland Kitchen HYDROcodone-acetaminophen (NORCO) 10-325 MG tablet Take 0.5-1 tablets by mouth 3 (three) times daily.    Marland Kitchen lamoTRIgine (LAMICTAL) 100 MG tablet Take 100 mg by mouth 2 (two) times daily.    . methocarbamol (ROBAXIN) 750 MG tablet Take 750 mg by mouth 4 (four) times daily.    . nitrofurantoin (MACRODANTIN) 100 MG capsule Take 100 mg by mouth 2 (two) times daily.    Marland Kitchen omeprazole (PRILOSEC) 40 MG capsule Take 40  mg by mouth 2 (two) times daily.    . ondansetron (ZOFRAN) 4 MG tablet Take 4 mg by mouth 3 (three) times daily as needed for nausea or vomiting.    . prazosin (MINIPRESS) 2 MG capsule Take 2 mg by mouth at bedtime.    . Prenatal Vit-Fe Fumarate-FA (PRENATAL COMPLETE PO) Take by mouth daily.    . QUEtiapine (SEROQUEL) 200 MG tablet Take 200 mg by mouth at bedtime.    . topiramate (TOPAMAX) 100 MG tablet Take 100 mg by mouth daily.    . Vitamin D, Ergocalciferol, (DRISDOL) 1.25 MG (50000 UT) CAPS capsule Take 50,000 Units by mouth every 7 (seven) days.    . ARIPiprazole (ABILIFY) 5 MG tablet  Take 1 tablet (5 mg total) daily by mouth. 30 tablet 0  . cholecalciferol (VITAMIN D) 1000 units tablet Take 1 tablet (1,000 Units total) daily by mouth. 30 tablet 0  . gabapentin (NEURONTIN) 300 MG capsule Take 1 capsule (300 mg total) 3 (three) times daily by mouth. 90 capsule 0  . lamoTRIgine (LAMICTAL) 25 MG tablet Take 1 tablet (25 mg total) daily by mouth. 30 tablet 0  . mirtazapine (REMERON) 7.5 MG tablet Take 1 tablet (7.5 mg total) at bedtime by mouth. 30 tablet 0  . predniSONE (DELTASONE) 50 MG tablet One tablet PO daily for 4 days 4 tablet 0  . topiramate (TOPAMAX) 25 MG tablet Take 1 tablet (25 mg total) every evening by mouth. 30 tablet 0  . traZODone (DESYREL) 50 MG tablet Take 1 tablet (50 mg total) at bedtime as needed by mouth for sleep. 30 tablet 0   No current facility-administered medications for this visit.     Allergies as of 05/10/2019 - Review Complete 05/10/2019  Allergen Reaction Noted  . Penicillins Rash 12/09/2012  . Sulfa antibiotics Anaphylaxis, Hives, and Swelling 12/09/2012  . Latex Other (See Comments) 12/09/2012  . Other Other (See Comments) 04/29/2016    Past Medical History:  Diagnosis Date  . Asthma   . Bipolar disorder (HCC)   . Concussion   . DDD (degenerative disc disease), lumbar   . Diabetes mellitus without complication (HCC)   . Dyslipidemia (high LDL; low HDL) 03/27/2016  . Dyspnea    with exertion  . Elevated serum creatinine 03/27/2016  . Fibromyalgia   . GERD (gastroesophageal reflux disease)   . Headache(784.0)   . Hypertension   . Mental disorder    anxiety  . Pneumonia 2015  . Vitamin D deficiency 03/27/2016    Past Surgical History:  Procedure Laterality Date  . CYST REMOVAL HAND Left   . ESOPHAGEAL DILATION    . ESOPHAGOGASTRODUODENOSCOPY  2006   Dr. Rehman:small hh, esophageal dilation.  Marland Kitchen FOOT SURGERY Right    'knot removed"  . MULTIPLE EXTRACTIONS WITH ALVEOLOPLASTY N/A 05/29/2016   Procedure: MULTIPLE EXTRACTIONS;   Surgeon: Ocie Doyne, DDS;  Location: [facility] OR;  Service: Oral Surgery;  Laterality: N/A;  . WISDOM TOOTH EXTRACTION      Family History  Problem Relation Age of Onset  . Cancer Maternal Grandmother        lung, back  . Diabetes Paternal Grandmother   . Heart disease Paternal Grandmother   . Diabetes Paternal Grandfather   . Heart disease Paternal Grandfather   . Stroke Paternal Grandfather     Social History   Socioeconomic History  . Marital status: Single    Spouse name: Not on file  . Number of children: Not on file  .  Years of education: Not on file  . Highest education level: Not on file  Occupational History  . Not on file  Social Needs  . Financial resource strain: Not on file  . Food insecurity    Worry: Not on file    Inability: Not on file  . Transportation needs    Medical: Not on file    Non-medical: Not on file  Tobacco Use  . Smoking status: Current Every Day Smoker    Packs/day: 0.50    Years: 10.00    Pack years: 5.00    Types: Cigarettes  . Smokeless tobacco: Never Used  Substance and Sexual Activity  . Alcohol use: Not Currently  . Drug use: No  . Sexual activity: Not Currently    Partners: Male    Birth control/protection: None, Abstinence  Lifestyle  . Physical activity    Days per week: Not on file    Minutes per session: Not on file  . Stress: Not on file  Relationships  . Social Musicianconnections    Talks on phone: Not on file    Gets together: Not on file    Attends religious service: Not on file    Active member of club or organization: Not on file    Attends meetings of clubs or organizations: Not on file    Relationship status: Not on file  . Intimate partner violence    Fear of current or ex partner: Not on file    Emotionally abused: Not on file    Physically abused: Not on file    Forced sexual activity: Not on file  Other Topics Concern  . Not on file  Social History Narrative  . Not on file      ROS:  General: Negative  for anorexia, weight loss, fever, chills, fatigue, weakness. Eyes: Negative for vision changes.  ENT: Negative for hoarseness,   nasal congestion.  See HPI CV: Negative for chest pain, angina, palpitations, dyspnea on exertion, peripheral edema.  Respiratory: Negative for dyspnea at rest, dyspnea on exertion, cough, sputum, wheezing.  GI: See history of present illness. GU:  Negative for dysuria, hematuria, urinary incontinence, urinary frequency, nocturnal urination.  MS: Negative for joint pain, low back pain.  Derm: Negative for rash or itching.  Neuro: Negative for weakness, abnormal sensation, seizure, frequent headaches, memory loss, confusion.  Psych: Positive for anxiety, depression, negative suicidal ideation, negative hallucinations.  Endo: Negative for unusual weight change.  Heme: Negative for bruising or bleeding. Allergy: Negative for rash or hives.    Physical Examination:  BP 139/83   Pulse (!) 107   Temp (!) 97.2 F (36.2 C) (Oral)   Ht 5\' 6"  (1.676 m)   Wt (!) 407 lb 6.4 oz (184.8 kg)   LMP 02/20/2019   BMI 65.76 kg/m    General: Well-nourished, well-developed in no acute distress.  Morbidly obese. Head: Normocephalic, atraumatic.   Eyes: Conjunctiva pink, no icterus. Mouth: Oropharyngeal mucosa moist and pink , no lesions erythema or exudate. Neck: Supple without thyromegaly, masses, or lymphadenopathy.  Lungs: Clear to auscultation bilaterally.  Heart: Regular rate and rhythm, no murmurs rubs or gallops.  Abdomen: Bowel sounds are normal, nontender, nondistended, no hepatosplenomegaly or masses, no abdominal bruits or    hernia , no rebound or guarding.  Exam limited due to body habitus. Rectal: Deferred Extremities: No lower extremity edema. No clubbing or deformities.  Neuro: Alert and oriented x 4 , grossly normal neurologically.  Skin: Warm and dry,  no rash or jaundice.   Psych: Alert and cooperative, normal mood and affect.  Labs: Lab Results   Component Value Date   WBC 6.5 11/01/2018   HGB 11.6 (L) 11/01/2018   HCT 36.0 11/01/2018   MCV 112.5 (H) 11/01/2018   PLT 282 11/01/2018   Lab Results  Component Value Date   CREATININE 1.03 (H) 11/01/2018   BUN 10 11/01/2018   NA 136 11/01/2018   K 3.8 11/01/2018   CL 105 11/01/2018   CO2 23 11/01/2018   Lab Results  Component Value Date   ALT 11 (L) 02/28/2017   AST 16 02/28/2017   ALKPHOS 84 02/28/2017   BILITOT 0.4 02/28/2017   No results found for: VITAMINB12 No results found for: FOLATE   Imaging Studies: No results found.   

## 2019-05-10 NOTE — Progress Notes (Signed)
Primary Care Physician:  Lemmie Evens, MD  Primary Gastroenterologist:  Garfield Cornea, MD   Chief Complaint  Patient presents with  . Dysphagia    had esoph stretched back in 2006. reports having trouble swallowing again. Unable to get food to go down. Feels like she "forgot how to swallow" x 1 year. sometimes feels like she is swallowing a rock, lower esophagus pain x 1 week    HPI:  [name] is a 43 y.o. female here at the request of Dr. Karie Kirks for further evaluation of Dysphagia.  Past medical history significant for anxiety, diabetes, bipolar disorder, asthma, morbid obesity.  Patient has a history of remote EGD in June 2006 by Dr. Laural Golden, small hiatal hernia otherwise normal exam. Esophagus dilated due to history of dysphagia.  She has gained 60 pounds in the past 2 years, 27 of those pounds since April of this year.  She states she only leaves her house to go to the doctor.  She has severe social anxiety.  She has 2 types of dysphagia complaints.  For the past 1 year she feels like she cannot swallow, does not remember how the swallowing.  Second complaint, new dysphagia occurring over the past couple of weeks.  Symptoms began after bending over, she had acute onset epigastric pain followed by difficulty eating solid food.  She admits to living on [facility] most of the time.  She does not try to eat solid food until in the evenings, typically will have 10 chicken nuggets at nighttime.  Now when she tries to eat she developed substernal pain, burning.  Feels like she swallowed a rock.  Symptoms occur after eating only 3 chicken nuggets.  Feels like everything is sitting in her chest. No pill dysphagia. Was taking prednisone, only new medication. Also with frequently develops cough/choking/gagging and then vomits fizzy bubbles. Happens frequently but not while eating. Several times per week. Happens while upright.   Has asthma uses inhalers. Omeprazole BID, has been on since  2008. If eats greasy pizza, will get heartburn. Hurts in epigastric region. BM usually good. Started pain medication recently for knee pain. Now some constipation. Stools are hard. No melena, brbpr.    Current Outpatient Medications  Medication Sig Dispense Refill  . albuterol (VENTOLIN HFA) 108 (90 Base) MCG/ACT inhaler Inhale 2 puffs into the lungs every 4 (four) hours as needed for wheezing or shortness of breath.    Marland Kitchen atorvastatin (LIPITOR) 20 MG tablet Take 20 mg by mouth every evening.    Marland Kitchen atorvastatin (LIPITOR) 20 MG tablet Take 20 mg by mouth daily.    . busPIRone (BUSPAR) 10 MG tablet Take 10 mg by mouth 3 (three) times daily.    . fluticasone (FLOVENT HFA) 220 MCG/ACT inhaler Inhale 1 puff into the lungs 2 (two) times daily.    Marland Kitchen gabapentin (NEURONTIN) 400 MG capsule Take 400 mg by mouth 3 (three) times daily.    . hydrochlorothiazide (HYDRODIURIL) 25 MG tablet Take 25 mg by mouth daily.    Marland Kitchen HYDROcodone-acetaminophen (NORCO) 10-325 MG tablet Take 0.5-1 tablets by mouth 3 (three) times daily.    Marland Kitchen lamoTRIgine (LAMICTAL) 100 MG tablet Take 100 mg by mouth 2 (two) times daily.    . methocarbamol (ROBAXIN) 750 MG tablet Take 750 mg by mouth 4 (four) times daily.    . nitrofurantoin (MACRODANTIN) 100 MG capsule Take 100 mg by mouth 2 (two) times daily.    Marland Kitchen omeprazole (PRILOSEC) 40 MG capsule Take 40  mg by mouth 2 (two) times daily.    . ondansetron (ZOFRAN) 4 MG tablet Take 4 mg by mouth 3 (three) times daily as needed for nausea or vomiting.    . prazosin (MINIPRESS) 2 MG capsule Take 2 mg by mouth at bedtime.    . Prenatal Vit-Fe Fumarate-FA (PRENATAL COMPLETE PO) Take by mouth daily.    . QUEtiapine (SEROQUEL) 200 MG tablet Take 200 mg by mouth at bedtime.    . topiramate (TOPAMAX) 100 MG tablet Take 100 mg by mouth daily.    . Vitamin D, Ergocalciferol, (DRISDOL) 1.25 MG (50000 UT) CAPS capsule Take 50,000 Units by mouth every 7 (seven) days.    . ARIPiprazole (ABILIFY) 5 MG tablet  Take 1 tablet (5 mg total) daily by mouth. 30 tablet 0  . cholecalciferol (VITAMIN D) 1000 units tablet Take 1 tablet (1,000 Units total) daily by mouth. 30 tablet 0  . gabapentin (NEURONTIN) 300 MG capsule Take 1 capsule (300 mg total) 3 (three) times daily by mouth. 90 capsule 0  . lamoTRIgine (LAMICTAL) 25 MG tablet Take 1 tablet (25 mg total) daily by mouth. 30 tablet 0  . mirtazapine (REMERON) 7.5 MG tablet Take 1 tablet (7.5 mg total) at bedtime by mouth. 30 tablet 0  . predniSONE (DELTASONE) 50 MG tablet One tablet PO daily for 4 days 4 tablet 0  . topiramate (TOPAMAX) 25 MG tablet Take 1 tablet (25 mg total) every evening by mouth. 30 tablet 0  . traZODone (DESYREL) 50 MG tablet Take 1 tablet (50 mg total) at bedtime as needed by mouth for sleep. 30 tablet 0   No current facility-administered medications for this visit.     Allergies as of 05/10/2019 - Review Complete 05/10/2019  Allergen Reaction Noted  . Penicillins Rash 12/09/2012  . Sulfa antibiotics Anaphylaxis, Hives, and Swelling 12/09/2012  . Latex Other (See Comments) 12/09/2012  . Other Other (See Comments) 04/29/2016    Past Medical History:  Diagnosis Date  . Asthma   . Bipolar disorder (HCC)   . Concussion   . DDD (degenerative disc disease), lumbar   . Diabetes mellitus without complication (HCC)   . Dyslipidemia (high LDL; low HDL) 03/27/2016  . Dyspnea    with exertion  . Elevated serum creatinine 03/27/2016  . Fibromyalgia   . GERD (gastroesophageal reflux disease)   . Headache(784.0)   . Hypertension   . Mental disorder    anxiety  . Pneumonia 2015  . Vitamin D deficiency 03/27/2016    Past Surgical History:  Procedure Laterality Date  . CYST REMOVAL HAND Left   . ESOPHAGEAL DILATION    . ESOPHAGOGASTRODUODENOSCOPY  2006   Dr. Rehman:small hh, esophageal dilation.  Marland Kitchen FOOT SURGERY Right    'knot removed"  . MULTIPLE EXTRACTIONS WITH ALVEOLOPLASTY N/A 05/29/2016   Procedure: MULTIPLE EXTRACTIONS;   Surgeon: Ocie Doyne, DDS;  Location: [facility] OR;  Service: Oral Surgery;  Laterality: N/A;  . WISDOM TOOTH EXTRACTION      Family History  Problem Relation Age of Onset  . Cancer Maternal Grandmother        lung, back  . Diabetes Paternal Grandmother   . Heart disease Paternal Grandmother   . Diabetes Paternal Grandfather   . Heart disease Paternal Grandfather   . Stroke Paternal Grandfather     Social History   Socioeconomic History  . Marital status: Single    Spouse name: Not on file  . Number of children: Not on file  .  Years of education: Not on file  . Highest education level: Not on file  Occupational History  . Not on file  Social Needs  . Financial resource strain: Not on file  . Food insecurity    Worry: Not on file    Inability: Not on file  . Transportation needs    Medical: Not on file    Non-medical: Not on file  Tobacco Use  . Smoking status: Current Every Day Smoker    Packs/day: 0.50    Years: 10.00    Pack years: 5.00    Types: Cigarettes  . Smokeless tobacco: Never Used  Substance and Sexual Activity  . Alcohol use: Not Currently  . Drug use: No  . Sexual activity: Not Currently    Partners: Male    Birth control/protection: None, Abstinence  Lifestyle  . Physical activity    Days per week: Not on file    Minutes per session: Not on file  . Stress: Not on file  Relationships  . Social Musicianconnections    Talks on phone: Not on file    Gets together: Not on file    Attends religious service: Not on file    Active member of club or organization: Not on file    Attends meetings of clubs or organizations: Not on file    Relationship status: Not on file  . Intimate partner violence    Fear of current or ex partner: Not on file    Emotionally abused: Not on file    Physically abused: Not on file    Forced sexual activity: Not on file  Other Topics Concern  . Not on file  Social History Narrative  . Not on file      ROS:  General: Negative  for anorexia, weight loss, fever, chills, fatigue, weakness. Eyes: Negative for vision changes.  ENT: Negative for hoarseness,   nasal congestion.  See HPI CV: Negative for chest pain, angina, palpitations, dyspnea on exertion, peripheral edema.  Respiratory: Negative for dyspnea at rest, dyspnea on exertion, cough, sputum, wheezing.  GI: See history of present illness. GU:  Negative for dysuria, hematuria, urinary incontinence, urinary frequency, nocturnal urination.  MS: Negative for joint pain, low back pain.  Derm: Negative for rash or itching.  Neuro: Negative for weakness, abnormal sensation, seizure, frequent headaches, memory loss, confusion.  Psych: Positive for anxiety, depression, negative suicidal ideation, negative hallucinations.  Endo: Negative for unusual weight change.  Heme: Negative for bruising or bleeding. Allergy: Negative for rash or hives.    Physical Examination:  BP 139/83   Pulse (!) 107   Temp (!) 97.2 F (36.2 C) (Oral)   Ht 5\' 6"  (1.676 m)   Wt (!) 407 lb 6.4 oz (184.8 kg)   LMP 02/20/2019   BMI 65.76 kg/m    General: Well-nourished, well-developed in no acute distress.  Morbidly obese. Head: Normocephalic, atraumatic.   Eyes: Conjunctiva pink, no icterus. Mouth: Oropharyngeal mucosa moist and pink , no lesions erythema or exudate. Neck: Supple without thyromegaly, masses, or lymphadenopathy.  Lungs: Clear to auscultation bilaterally.  Heart: Regular rate and rhythm, no murmurs rubs or gallops.  Abdomen: Bowel sounds are normal, nontender, nondistended, no hepatosplenomegaly or masses, no abdominal bruits or    hernia , no rebound or guarding.  Exam limited due to body habitus. Rectal: Deferred Extremities: No lower extremity edema. No clubbing or deformities.  Neuro: Alert and oriented x 4 , grossly normal neurologically.  Skin: Warm and dry,  no rash or jaundice.   Psych: Alert and cooperative, normal mood and affect.  Labs: Lab Results   Component Value Date   WBC 6.5 11/01/2018   HGB 11.6 (L) 11/01/2018   HCT 36.0 11/01/2018   MCV 112.5 (H) 11/01/2018   PLT 282 11/01/2018   Lab Results  Component Value Date   CREATININE 1.03 (H) 11/01/2018   BUN 10 11/01/2018   NA 136 11/01/2018   K 3.8 11/01/2018   CL 105 11/01/2018   CO2 23 11/01/2018   Lab Results  Component Value Date   ALT 11 (L) 02/28/2017   AST 16 02/28/2017   ALKPHOS 84 02/28/2017   BILITOT 0.4 02/28/2017   No results found for: VITAMINB12 No results found for: FOLATE   Imaging Studies: No results found.

## 2019-05-10 NOTE — Telephone Encounter (Signed)
Please let patient know that when reviewing her April labs it was noted that she had mild macrocytic anemia.  If she is willing to, would recommend updating labs including CBC, B12, folate, ferritin.

## 2019-05-10 NOTE — Assessment & Plan Note (Addendum)
Pleasant 43 year old morbidly obese female presenting with fairly recent onset solid food dysphagia/odynophagia/epigastric pain.  Symptoms began after she bent over and felt severe epigastric pain.  Now when she tries to eat solid food, feels like everything is getting stuck for sitting at the substernal/epigastric area.  She will maintain PPI twice daily.  We will add Carafate before meals and at bedtime.  Symptoms may be secondary to complicated GERD, large hiatal hernia/Cameron lesions, PUD.  Offered upper endoscopy with possible esophageal dilation with propofol (given polypharmacy and morbid obesity).  I have discussed the risks, alternatives, benefits with regards to but not limited to the risk of reaction to medication, bleeding, infection, perforation and the patient is agreeable to proceed. Written consent to be obtained.   If her symptoms worsen, she is unable to swallow/eat, she has been to the ED. Marland Kitchen

## 2019-05-10 NOTE — Telephone Encounter (Signed)
Lmom, waiting on a return call.  

## 2019-05-11 NOTE — Patient Instructions (Signed)
Judith CHAPDELAINE  05/11/2019     @PREFPERIOPPHARMACY @   Your procedure is scheduled on  05/15/2019   Report to [facility] at  1345 (1:45)  P.M.  Call this number if you have problems the morning of surgery:  [phone]   Remember:  Follow the diet and prep instructions given to you by Dr. Roseanne Kaufman office.                        Take these medicines the morning of surgery with A SIP OF WATER: abilify, byspar, gabapentin, hydrocodone9if neded), lamictil, prilosec, zofran, topamx. Use your inhaler before you come and bring it with you.    Do not wear jewelry, make-up or nail polish.  Do not wear lotions, powders, or perfumes, or deodorant.  Do not shave 48 hours prior to surgery.  Men may shave face and neck.  Do not bring valuables to the hospital.  [facility] is not responsible for any belongings or valuables.  Contacts, dentures or bridgework may not be worn into surgery.  Leave your suitcase in the car.  After surgery it may be brought to your room.  For patients admitted to the hospital, discharge time will be determined by your treatment team.  Patients discharged the day of surgery will not be allowed to drive home.   Name and phone number of your driver:   family Special instructions:  None  Please read over the following fact sheets that you were given. Anesthesia Post-op Instructions and Care and Recovery After Surgery       Upper Endoscopy, Adult, Care After This sheet gives you information about how to care for yourself after your procedure. Your health care provider may also give you more specific instructions. If you have problems or questions, contact your health care provider. What can I expect after the procedure? After the procedure, it is common to have:  A sore throat.  Mild stomach pain or discomfort.  Bloating.  Nausea. Follow these instructions at home:   Follow instructions from your health care provider about what to eat or  drink after your procedure.  Return to your normal activities as told by your health care provider. Ask your health care provider what activities are safe for you.  Take over-the-counter and prescription medicines only as told by your health care provider.  Do not drive for 24 hours if you were given a sedative during your procedure.  Keep all follow-up visits as told by your health care provider. This is important. Contact a health care provider if you have:  A sore throat that lasts longer than one day.  Trouble swallowing. Get help right away if:  You vomit blood or your vomit looks like coffee grounds.  You have: ? A fever. ? Bloody, black, or tarry stools. ? A severe sore throat or you cannot swallow. ? Difficulty breathing. ? Severe pain in your chest or abdomen. Summary  After the procedure, it is common to have a sore throat, mild stomach discomfort, bloating, and nausea.  Do not drive for 24 hours if you were given a sedative during the procedure.  Follow instructions from your health care provider about what to eat or drink after your procedure.  Return to your normal activities as told by your health care provider. This information is not intended to replace advice given to you by your health care provider. Make sure you discuss any  questions you have with your health care provider. Document Released: 12/29/2011 Document Revised: 12/21/2017 Document Reviewed: 11/29/2017 Elsevier Patient Education  2020 [employer]  Esophageal Dilatation Esophageal dilatation, also called esophageal dilation, is a procedure to widen or open (dilate) a blocked or narrowed part of the esophagus. The esophagus is the part of the body that moves food and liquid from the mouth to the stomach. You may need this procedure if:  You have a buildup of scar tissue in your esophagus that makes it difficult, painful, or impossible to swallow. This can be caused by gastroesophageal reflux  disease (GERD).  You have cancer of the esophagus.  There is a problem with how food moves through your esophagus. In some cases, you may need this procedure repeated at a later time to dilate the esophagus gradually. Tell a health care provider about:  Any allergies you have.  All medicines you are taking, including vitamins, herbs, eye drops, creams, and over-the-counter medicines.  Any problems you or family members have had with anesthetic medicines.  Any blood disorders you have.  Any surgeries you have had.  Any medical conditions you have.  Any antibiotic medicines you are required to take before dental procedures.  Whether you are pregnant or may be pregnant. What are the risks? Generally, this is a safe procedure. However, problems may occur, including:  Bleeding due to a tear in the lining of the esophagus.  A hole (perforation) in the esophagus. What happens before the procedure?  Follow instructions from your health care provider about eating or drinking restrictions.  Ask your health care provider about changing or stopping your regular medicines. This is especially important if you are taking diabetes medicines or blood thinners.  Plan to have someone take you home from the hospital or clinic.  Plan to have a responsible adult care for you for at least 24 hours after you leave the hospital or clinic. This is important. What happens during the procedure?  You may be given a medicine to help you relax (sedative).  A numbing medicine may be sprayed into the back of your throat, or you may gargle the medicine.  Your health care provider may perform the dilatation using various surgical instruments, such as: ? Simple dilators. This instrument is carefully placed in the esophagus to stretch it. ? Guided wire bougies. This involves using an endoscope to insert a wire into the esophagus. A dilator is passed over this wire to enlarge the esophagus. Then the wire is  removed. ? Balloon dilators. An endoscope with a small balloon at the end is inserted into the esophagus. The balloon is inflated to stretch the esophagus and open it up. The procedure may vary among health care providers and hospitals. What happens after the procedure?  Your blood pressure, heart rate, breathing rate, and blood oxygen level will be monitored until the medicines you were given have worn off.  Your throat may feel slightly sore and numb. This will improve slowly over time.  You will not be allowed to eat or drink until your throat is no longer numb.  When you are able to drink, urinate, and sit on the edge of the bed without nausea or dizziness, you may be able to return home. Follow these instructions at home:  Take over-the-counter and prescription medicines only as told by your health care provider.  Do not drive for 24 hours if you were given a sedative during your procedure.  You should have a responsible  adult with you for 24 hours after the procedure.  Follow instructions from your health care provider about any eating or drinking restrictions.  Do not use any products that contain nicotine or tobacco, such as cigarettes and e-cigarettes. If you need help quitting, ask your health care provider.  Keep all follow-up visits as told by your health care provider. This is important. Get help right away if you:  Have a fever.  Have chest pain.  Have pain that is not relieved by medication.  Have trouble breathing.  Have trouble swallowing.  Vomit blood. Summary  Esophageal dilatation, also called esophageal dilation, is a procedure to widen or open (dilate) a blocked or narrowed part of the esophagus.  Plan to have someone take you home from the hospital or clinic.  For this procedure, a numbing medicine may be sprayed into the back of your throat, or you may gargle the medicine.  Do not drive for 24 hours if you were given a sedative during your  procedure. This information is not intended to replace advice given to you by your health care provider. Make sure you discuss any questions you have with your health care provider. Document Released: 08/20/2005 Document Revised: 06/11/2017 Document Reviewed: 05/04/2017 Elsevier Patient Education  2020 Else[employer] Inc. Monitored Anesthesia Care, Care After These instructions provide you with information about caring for yourself after your procedure. Your health care provider may also give you more specific instructions. Your treatment has been planned according to current medical practices, but problems sometimes occur. Call your health care provider if you have any problems or questions after your procedure. What can I expect after the procedure? After your procedure, you may:  Feel sleepy for several hours.  Feel clumsy and have poor balance for several hours.  Feel forgetful about what happened after the procedure.  Have poor judgment for several hours.  Feel nauseous or vomit.  Have a sore throat if you had a breathing tube during the procedure. Follow these instructions at home: For at least 24 hours after the procedure:      Have a responsible adult stay with you. It is important to have someone help care for you until you are awake and alert.  Rest as needed.  Do not: ? Participate in activities in which you could fall or become injured. ? Drive. ? Use heavy machinery. ? Drink alcohol. ? Take sleeping pills or medicines that cause drowsiness. ? Make important decisions or sign legal documents. ? Take care of children on your own. Eating and drinking  Follow the diet that is recommended by your health care provider.  If you vomit, drink water, juice, or soup when you can drink without vomiting.  Make sure you have little or no nausea before eating solid foods. General instructions  Take over-the-counter and prescription medicines only as told by your health care  provider.  If you have sleep apnea, surgery and certain medicines can increase your risk for breathing problems. Follow instructions from your health care provider about wearing your sleep device: ? Anytime you are sleeping, including during daytime naps. ? While taking prescription pain medicines, sleeping medicines, or medicines that make you drowsy.  If you smoke, do not smoke without supervision.  Keep all follow-up visits as told by your health care provider. This is important. Contact a health care provider if:  You keep feeling nauseous or you keep vomiting.  You feel light-headed.  You develop a rash.  You have a fever. Get  help right away if:  You have trouble breathing. Summary  For several hours after your procedure, you may feel sleepy and have poor judgment.  Have a responsible adult stay with you for at least 24 hours or until you are awake and alert. This information is not intended to replace advice given to you by your health care provider. Make sure you discuss any questions you have with your health care provider. Document Released: 10/20/2015 Document Revised: 09/27/2017 Document Reviewed: 10/20/2015 Elsevier Patient Education  2020 [employer].

## 2019-05-12 ENCOUNTER — Telehealth: Payer: Self-pay | Admitting: Gastroenterology

## 2019-05-12 ENCOUNTER — Encounter (HOSPITAL_COMMUNITY)
Admission: RE | Admit: 2019-05-12 | Discharge: 2019-05-12 | Disposition: A | Discharge status: Home / Self Care | Payer: Medicaid Other | Source: Ambulatory Visit | Attending: Internal Medicine | Admitting: Internal Medicine

## 2019-05-12 ENCOUNTER — Other Ambulatory Visit: Payer: Self-pay

## 2019-05-12 ENCOUNTER — Encounter (HOSPITAL_COMMUNITY): Payer: Self-pay

## 2019-05-12 ENCOUNTER — Other Ambulatory Visit (HOSPITAL_COMMUNITY)
Admission: RE | Admit: 2019-05-12 | Discharge: 2019-05-12 | Disposition: A | Discharge status: Home / Self Care | Payer: Medicaid Other | Source: Ambulatory Visit | Attending: Internal Medicine | Admitting: Internal Medicine

## 2019-05-12 DIAGNOSIS — D539 Nutritional anemia, unspecified: Secondary | ICD-10-CM | POA: Diagnosis not present

## 2019-05-12 DIAGNOSIS — Z01812 Encounter for preprocedural laboratory examination: Secondary | ICD-10-CM | POA: Diagnosis present

## 2019-05-12 HISTORY — DX: Spondylosis, unspecified: M47.9

## 2019-05-12 HISTORY — DX: Other general symptoms and signs: R68.89

## 2019-05-12 HISTORY — DX: Other sleep apnea: G47.39

## 2019-05-12 LAB — CBC WITH DIFFERENTIAL/PLATELET
Abs Immature Granulocytes: 0.06 10*3/uL (ref 0.00–0.07)
Basophils Absolute: 0 10*3/uL (ref 0.0–0.1)
Basophils Relative: 0 %
Eosinophils Absolute: 0.2 10*3/uL (ref 0.0–0.5)
Eosinophils Relative: 2 %
HCT: 36.4 % (ref 36.0–46.0)
Hemoglobin: 11.5 g/dL — ABNORMAL LOW (ref 12.0–15.0)
Immature Granulocytes: 1 %
Lymphocytes Relative: 23 %
Lymphs Abs: 2 10*3/uL (ref 0.7–4.0)
MCH: 35.8 pg — ABNORMAL HIGH (ref 26.0–34.0)
MCHC: 31.6 g/dL (ref 30.0–36.0)
MCV: 113.4 fL — ABNORMAL HIGH (ref 80.0–100.0)
Monocytes Absolute: 0.7 10*3/uL (ref 0.1–1.0)
Monocytes Relative: 7 %
Neutro Abs: 5.9 10*3/uL (ref 1.7–7.7)
Neutrophils Relative %: 67 %
Platelets: 238 10*3/uL (ref 150–400)
RBC: 3.21 MIL/uL — ABNORMAL LOW (ref 3.87–5.11)
RDW: 18.8 % — ABNORMAL HIGH (ref 11.5–15.5)
WBC: 8.7 10*3/uL (ref 4.0–10.5)
nRBC: 0 % (ref 0.0–0.2)

## 2019-05-12 LAB — IRON AND TIBC
Iron: 29 ug/dL (ref 28–170)
Saturation Ratios: 9 % — ABNORMAL LOW (ref 10.4–31.8)
TIBC: 308 ug/dL (ref 250–450)
UIBC: 279 ug/dL

## 2019-05-12 LAB — BASIC METABOLIC PANEL
Anion gap: 9 (ref 5–15)
BUN: 9 mg/dL (ref 6–20)
CO2: 23 mmol/L (ref 22–32)
Calcium: 8.5 mg/dL — ABNORMAL LOW (ref 8.9–10.3)
Chloride: 105 mmol/L (ref 98–111)
Creatinine, Ser: 1.2 mg/dL — ABNORMAL HIGH (ref 0.44–1.00)
GFR calc Af Amer: >60 mL/min (ref >60)
GFR calc non Af Amer: 56 mL/min — ABNORMAL LOW (ref >60)
Glucose, Bld: 186 mg/dL — ABNORMAL HIGH (ref 70–99)
Potassium: 3.3 mmol/L — ABNORMAL LOW (ref 3.5–5.1)
Sodium: 137 mmol/L (ref 135–145)

## 2019-05-12 LAB — RETIC PANEL
Immature Retic Fract: 14 % (ref 2.3–15.9)
RBC.: 3.21 MIL/uL — ABNORMAL LOW (ref 3.87–5.11)
Retic Count, Absolute: 52.6 10*3/uL (ref 19.0–186.0)
Retic Ct Pct: 1.6 % (ref 0.4–3.1)
Reticulocyte Hemoglobin: 41.4 pg (ref >27.9)

## 2019-05-12 LAB — FOLATE: Folate: 3.8 ng/mL — ABNORMAL LOW (ref >5.9)

## 2019-05-12 LAB — SARS CORONAVIRUS 2 (TAT 6-24 HRS): SARS Coronavirus 2: NEGATIVE

## 2019-05-12 LAB — VITAMIN B12: Vitamin B-12: 202 pg/mL (ref 180–914)

## 2019-05-12 LAB — HCG, QUANTITATIVE, PREGNANCY: hCG, Beta Chain, Quant, S: 1 m[IU]/mL (ref <5)

## 2019-05-12 LAB — FERRITIN: Ferritin: 33 ng/mL (ref 11–307)

## 2019-05-12 MED ORDER — POTASSIUM CHLORIDE ER 10 MEQ PO TBCR: 20.0000 meq | EXTENDED_RELEASE_TABLET | Freq: Two times a day (BID) | ORAL | 0 refills | Status: DC

## 2019-05-12 NOTE — Progress Notes (Signed)
   05/12/19 0824  OBSTRUCTIVE SLEEP APNEA  Have you ever been diagnosed with sleep apnea through a sleep study? No  Do you snore loudly (loud enough to be heard through closed doors)?  1  Do you often feel tired, fatigued, or sleepy during the daytime (such as falling asleep during driving or talking to someone)? 1  Has anyone observed you stop breathing during your sleep? 1  Do you have, or are you being treated for high blood pressure? 1  BMI more than 35 kg/m2? 1  Age > 50 (1-yes) 0  Neck circumference greater than:Female 16 inches or larger, Female 17inches or larger? 1  Female Gender (Yes=1) 0  Obstructive Sleep Apnea Score 6  Score 5 or greater  Results sent to PCP

## 2019-05-12 NOTE — Progress Notes (Signed)
Potassium mildly low, performed during preadmission testing. I contacted patient. Sending in oral potassium X 2 days. Additional labs noted, ordered by Neil Crouch, PA, after appointment due to macrocytic anemia. Recommendations for this when Magda Paganini returns Monday.

## 2019-05-12 NOTE — Telephone Encounter (Signed)
Potassium sent to pharmacy 

## 2019-05-15 ENCOUNTER — Encounter (HOSPITAL_COMMUNITY)
Admission: RE | Disposition: A | Discharge status: Home / Self Care | Payer: Self-pay | Source: Home / Self Care | Attending: Internal Medicine

## 2019-05-15 ENCOUNTER — Ambulatory Visit (HOSPITAL_COMMUNITY)
Admission: RE | Admit: 2019-05-15 | Discharge: 2019-05-15 | Disposition: A | Discharge status: Home / Self Care | Payer: Medicaid Other | Attending: Internal Medicine | Admitting: Internal Medicine

## 2019-05-15 ENCOUNTER — Ambulatory Visit (HOSPITAL_COMMUNITY): Payer: Medicaid Other | Admitting: Anesthesiology

## 2019-05-15 ENCOUNTER — Encounter (HOSPITAL_COMMUNITY): Payer: Self-pay | Admitting: *Deleted

## 2019-05-15 DIAGNOSIS — Z833 Family history of diabetes mellitus: Secondary | ICD-10-CM | POA: Diagnosis not present

## 2019-05-15 DIAGNOSIS — E785 Hyperlipidemia, unspecified: Secondary | ICD-10-CM | POA: Insufficient documentation

## 2019-05-15 DIAGNOSIS — M797 Fibromyalgia: Secondary | ICD-10-CM | POA: Insufficient documentation

## 2019-05-15 DIAGNOSIS — Z880 Allergy status to penicillin: Secondary | ICD-10-CM | POA: Diagnosis not present

## 2019-05-15 DIAGNOSIS — R1310 Dysphagia, unspecified: Secondary | ICD-10-CM | POA: Insufficient documentation

## 2019-05-15 DIAGNOSIS — K449 Diaphragmatic hernia without obstruction or gangrene: Secondary | ICD-10-CM | POA: Insufficient documentation

## 2019-05-15 DIAGNOSIS — F17210 Nicotine dependence, cigarettes, uncomplicated: Secondary | ICD-10-CM | POA: Insufficient documentation

## 2019-05-15 DIAGNOSIS — F319 Bipolar disorder, unspecified: Secondary | ICD-10-CM | POA: Diagnosis not present

## 2019-05-15 DIAGNOSIS — M1990 Unspecified osteoarthritis, unspecified site: Secondary | ICD-10-CM | POA: Diagnosis not present

## 2019-05-15 DIAGNOSIS — Z6841 Body Mass Index (BMI) 40.0 and over, adult: Secondary | ICD-10-CM | POA: Diagnosis not present

## 2019-05-15 DIAGNOSIS — I10 Essential (primary) hypertension: Secondary | ICD-10-CM | POA: Diagnosis not present

## 2019-05-15 DIAGNOSIS — Z7984 Long term (current) use of oral hypoglycemic drugs: Secondary | ICD-10-CM | POA: Insufficient documentation

## 2019-05-15 DIAGNOSIS — K209 Esophagitis, unspecified without bleeding: Secondary | ICD-10-CM | POA: Diagnosis not present

## 2019-05-15 DIAGNOSIS — K2100 Gastro-esophageal reflux disease with esophagitis, without bleeding: Secondary | ICD-10-CM | POA: Insufficient documentation

## 2019-05-15 DIAGNOSIS — E119 Type 2 diabetes mellitus without complications: Secondary | ICD-10-CM | POA: Diagnosis not present

## 2019-05-15 DIAGNOSIS — F419 Anxiety disorder, unspecified: Secondary | ICD-10-CM | POA: Insufficient documentation

## 2019-05-15 DIAGNOSIS — F99 Mental disorder, not otherwise specified: Secondary | ICD-10-CM | POA: Diagnosis not present

## 2019-05-15 DIAGNOSIS — J45909 Unspecified asthma, uncomplicated: Secondary | ICD-10-CM | POA: Diagnosis not present

## 2019-05-15 DIAGNOSIS — K219 Gastro-esophageal reflux disease without esophagitis: Secondary | ICD-10-CM

## 2019-05-15 DIAGNOSIS — R1319 Other dysphagia: Secondary | ICD-10-CM

## 2019-05-15 DIAGNOSIS — Z7951 Long term (current) use of inhaled steroids: Secondary | ICD-10-CM | POA: Insufficient documentation

## 2019-05-15 DIAGNOSIS — Z888 Allergy status to other drugs, medicaments and biological substances status: Secondary | ICD-10-CM | POA: Diagnosis not present

## 2019-05-15 DIAGNOSIS — Z91040 Latex allergy status: Secondary | ICD-10-CM | POA: Insufficient documentation

## 2019-05-15 DIAGNOSIS — Z79899 Other long term (current) drug therapy: Secondary | ICD-10-CM | POA: Diagnosis not present

## 2019-05-15 DIAGNOSIS — Z8249 Family history of ischemic heart disease and other diseases of the circulatory system: Secondary | ICD-10-CM | POA: Insufficient documentation

## 2019-05-15 DIAGNOSIS — Z882 Allergy status to sulfonamides status: Secondary | ICD-10-CM | POA: Insufficient documentation

## 2019-05-15 DIAGNOSIS — R1013 Epigastric pain: Secondary | ICD-10-CM

## 2019-05-15 DIAGNOSIS — Z881 Allergy status to other antibiotic agents status: Secondary | ICD-10-CM | POA: Insufficient documentation

## 2019-05-15 HISTORY — PX: ESOPHAGOGASTRODUODENOSCOPY (EGD) WITH PROPOFOL: SHX5813

## 2019-05-15 HISTORY — PX: MALONEY DILATION: SHX5535

## 2019-05-15 LAB — GLUCOSE, CAPILLARY
Glucose-Capillary: 131 mg/dL — ABNORMAL HIGH (ref 70–99)
Glucose-Capillary: 120 mg/dL — ABNORMAL HIGH (ref 70–99)

## 2019-05-15 SURGERY — ESOPHAGOGASTRODUODENOSCOPY (EGD) WITH PROPOFOL
Anesthesia: General

## 2019-05-15 MED ORDER — HYDROCODONE-ACETAMINOPHEN 7.5-325 MG PO TABS: 1.0000 | ORAL_TABLET | Freq: Once | ORAL | Status: DC | PRN

## 2019-05-15 MED ORDER — ONDANSETRON HCL 4 MG/2ML IJ SOLN
INTRAMUSCULAR | Status: AC
  Filled 2019-05-15: qty 2

## 2019-05-15 MED ORDER — LIDOCAINE HCL (PF) 2 % IJ SOLN
INTRAMUSCULAR | Status: DC | PRN
  Administered 2019-05-15: 60 mg

## 2019-05-15 MED ORDER — PROMETHAZINE HCL 25 MG/ML IJ SOLN: 6.2500 mg | INTRAMUSCULAR | Status: DC | PRN

## 2019-05-15 MED ORDER — KETAMINE HCL 50 MG/5ML IJ SOSY
PREFILLED_SYRINGE | INTRAMUSCULAR | Status: AC
  Filled 2019-05-15: qty 5

## 2019-05-15 MED ORDER — MIDAZOLAM HCL 2 MG/2ML IJ SOLN: 0.5000 mg | Freq: Once | INTRAMUSCULAR | Status: DC | PRN

## 2019-05-15 MED ORDER — PROPOFOL 10 MG/ML IV BOLUS
INTRAVENOUS | Status: DC | PRN
  Administered 2019-05-15: 40 mg via INTRAVENOUS
  Administered 2019-05-15: 20 mg via INTRAVENOUS
  Administered 2019-05-15: 40 mg via INTRAVENOUS
  Administered 2019-05-15 (×2): 50 mg via INTRAVENOUS

## 2019-05-15 MED ORDER — CHLORHEXIDINE GLUCONATE CLOTH 2 % EX PADS: 6.0000 | MEDICATED_PAD | Freq: Once | CUTANEOUS | Status: DC

## 2019-05-15 MED ORDER — LACTATED RINGERS IV SOLN
INTRAVENOUS | Status: DC
  Administered 2019-05-15: 14:00:00 1000 mL via INTRAVENOUS

## 2019-05-15 MED ORDER — GLYCOPYRROLATE PF 0.2 MG/ML IJ SOSY
PREFILLED_SYRINGE | INTRAMUSCULAR | Status: AC
  Filled 2019-05-15: qty 1

## 2019-05-15 MED ORDER — HYDROMORPHONE HCL 1 MG/ML IJ SOLN: 0.2500 mg | INTRAMUSCULAR | Status: DC | PRN

## 2019-05-15 MED ORDER — KETAMINE HCL 10 MG/ML IJ SOLN
INTRAMUSCULAR | Status: DC | PRN
  Administered 2019-05-15: 20 mg via INTRAVENOUS

## 2019-05-15 MED ORDER — ONDANSETRON HCL 4 MG/2ML IJ SOLN
INTRAMUSCULAR | Status: DC | PRN
  Administered 2019-05-15: 4 mg via INTRAVENOUS

## 2019-05-15 MED ORDER — LIDOCAINE VISCOUS HCL 2 % MT SOLN
OROMUCOSAL | Status: AC
  Filled 2019-05-15: qty 15

## 2019-05-15 NOTE — Op Note (Signed)
[facility] Patient Name: [name] Procedure Date: 05/15/2019 2:31 PM MRN: [id number] Date of Birth: [birth year] Attending MD: Norvel Richards , MD CSN: [id number] Age: 43 Admit Type: Outpatient Procedure:                Upper GI endoscopy Indications:              Dysphagia Providers:                Norvel Richards, MD, Jeanann Lewandowsky. Sharon Seller, RN,                            Aram Candela Referring MD:             Lemmie Evens Medicines:                Propofol per Anesthesia Complications:            No immediate complications. Estimated Blood Loss:     Estimated blood loss was minimal. Estimated blood                            loss was minimal. Procedure:                Pre-Anesthesia Assessment:                           - Prior to the procedure, a History and Physical                            was performed, and patient medications and                            allergies were reviewed. The patient's tolerance of                            previous anesthesia was also reviewed. The risks                            and benefits of the procedure and the sedation                            options and risks were discussed with the patient.                            All questions were answered, and informed consent                            was obtained. Prior Anticoagulants: The patient has                            taken no previous anticoagulant or antiplatelet                            agents. ASA Grade Assessment: III - A patient with  severe systemic disease. After reviewing the risks                            and benefits, the patient was deemed in                            satisfactory condition to undergo the procedure.                           After obtaining informed consent, the endoscope was                            passed under direct vision. Throughout the                            procedure, the patient's blood  pressure, pulse, and                            oxygen saturations were monitored continuously. The                            GIF-[id number] ([id number] scope was introduced through the                            mouth, and advanced to the second part of duodenum.                            The upper GI endoscopy was accomplished without                            difficulty. The patient tolerated the procedure                            well. Scope In: 2:58:12 PM Scope Out: 3:06:03 PM Total Procedure Duration: 0 hours 7 minutes 51 seconds  Findings:      Esophagitis was found. Circumferential erosions or excoriations       involving a 10 cm segment of mid esophagus were found with some       overlying exudate. Most proximal esophageal mucosa appeared normal as       did the distal third of the tubular esophagus. Tubular esophagus       appeared patent throughout its course. The scope was withdrawn. Dilation       was performed with a Maloney dilator with no resistance at 56 Fr. The       dilation site was examined following endoscope reinsertion and showed no       change. Estimated blood loss: none. This was biopsied with a cold       forceps for histology. Estimated blood loss was minimal.      A small hiatal hernia was present.      The exam was otherwise without abnormality.      The duodenal bulb and second portion of the duodenum were normal. Impression:               - Esophagitis. Appearance and location suspicious  for chemical/medication induced esophageal injury.                            Status post dilation followed by biopsy                           - Small hiatal hernia.                           - The examination was otherwise normal.                           - No specimens collected. Moderate Sedation:      Moderate (conscious) sedation was personally administered by an       anesthesia professional. The following parameters were monitored:  oxygen       saturation, heart rate, blood pressure, respiratory rate, EKG, adequacy       of pulmonary ventilation, and response to care. Recommendation:           - Patient has a contact number available for                            emergencies. The signs and symptoms of potential                            delayed complications were discussed with the                            patient. Return to normal activities tomorrow.                            Written discharge instructions were provided to the                            patient.                           - Resume previous diet.                           - Patient has a contact number available for                            emergencies. The signs and symptoms of potential                            delayed complications were discussed with the                            patient. Return to normal activities tomorrow.                            Written discharge instructions were provided to the                            patient.                           -  Advance diet as tolerated.                           - Continue present medications. Continue omeprazole                            40 mg daily. Patient is urged to go ahead and start                            Carafate 1 g slurry's 4 times daily already                            prescribed but not yet started.                           - Return to GI clinic in 3 months. Patient has a                            confusing list of medications. I suggested she                            double check the medications that her prescribing                            providers have given her previously Procedure Code(s):        --- Professional ---                           [phone], Esophagogastroduodenoscopy, flexible,                            transoral; with biopsy, single or multiple                           43450, Dilation of esophagus, by unguided sound or                             bougie, single or multiple passes Diagnosis Code(s):        --- Professional ---                           K20.9, Esophagitis, unspecified                           K44.9, Diaphragmatic hernia without obstruction or                            gangrene                           R13.10, Dysphagia, unspecified CPT copyright 2019 American Medical Association. All rights reserved. The codes documented in this report are preliminary and upon coder review may  be revised to meet current compliance requirements. Gerrit Friends. Uliana Brinker, MD Gennette Pac, MD 05/15/2019 3:45:15 PM This report has been signed electronically. Number of Addenda: 0

## 2019-05-15 NOTE — Discharge Instructions (Signed)
EGD Discharge instructions Please read the instructions outlined below and refer to this sheet in the next few weeks. These discharge instructions provide you with general information on caring for yourself after you leave the hospital. Your doctor may also give you specific instructions. While your treatment has been planned according to the most current medical practices available, unavoidable complications occasionally occur. If you have any problems or questions after discharge, please call your doctor. ACTIVITY  You may resume your regular activity but move at a slower pace for the next 24 hours.   Take frequent rest periods for the next 24 hours.   Walking will help expel (get rid of) the air and reduce the bloated feeling in your abdomen.   No driving for 24 hours (because of the anesthesia (medicine) used during the test).   You may shower.   Do not sign any important legal documents or operate any machinery for 24 hours (because of the anesthesia used during the test).  NUTRITION  Drink plenty of fluids.   You may resume your normal diet.   Begin with a light meal and progress to your normal diet.   Avoid alcoholic beverages for 24 hours or as instructed by your caregiver.  MEDICATIONS  You may resume your normal medications unless your caregiver tells you otherwise.  WHAT YOU CAN EXPECT TODAY  You may experience abdominal discomfort such as a feeling of fullness or gas pains.  FOLLOW-UP  Your doctor will discuss the results of your test with you.  SEEK IMMEDIATE MEDICAL ATTENTION IF ANY OF THE FOLLOWING OCCUR:  Excessive nausea (feeling sick to your stomach) and/or vomiting.   Severe abdominal pain and distention (swelling).   Trouble swallowing.   Temperature over 101 F (37.8 C).   Rectal bleeding or vomiting of blood.   There is conflict regarding your prescribed medication list.  You should review all of your medications prescribed by other providers  before restarting them  Continue omeprazole 40 mg twice daily  Begin Carafate 1 g before meals and at bedtime prescribed previously but prescription not picked up as of yet  Be sure when you swallow your medications to remain upright for 30 minutes and consume 8 ounces of fluids to make sure medication does not get stuck in your esophagus  Office visit with [place] in 3 months  Further recommendations to follow pending review of pathology report  At patient's request, I called Makayla at [phone] and discussed findings and recommendations

## 2019-05-15 NOTE — Anesthesia Preprocedure Evaluation (Signed)
Anesthesia Evaluation  Patient identified by MRN, date of birth, ID band Patient awake    Reviewed: Allergy & Precautions, NPO status , Patient's Chart, lab work & pertinent test results  Airway Mallampati: I  TM Distance: >3 FB Neck ROM: Full    Dental no notable dental hx. (+) Edentulous Upper   Pulmonary shortness of breath and with exertion, asthma , sleep apnea , pneumonia, resolved, Current SmokerPatient did not abstain from smoking.,  BMI ~70 probable OSA Never Dx'd or treated  Explained reason to treat OSA Smoker  Smoked today Denies home o2 use  On mult asthma meds   Pulmonary exam normal breath sounds clear to auscultation       Cardiovascular Exercise Tolerance: Poor hypertension, Pt. on medications negative cardio ROS Normal cardiovascular examII Rhythm:Regular Rate:Normal  Pt reports DOE Denies any known cardiac Disease  Reports decreased ETdue to LBP and Lower extremity issues    Neuro/Psych  Headaches, PSYCHIATRIC DISORDERS Bipolar Disorder  Neuromuscular disease    GI/Hepatic Neg liver ROS, GERD  Medicated and Controlled,BMI ~70 -SMO   Endo/Other  diabetes, Type 2, Oral Hypoglycemic AgentsMorbid obesity  Renal/GU negative Renal ROS  negative genitourinary   Musculoskeletal  (+) Arthritis , Fibromyalgia -, narcotic dependent  Abdominal   Peds negative pediatric ROS (+)  Hematology negative hematology ROS (+)   Anesthesia Other Findings   Reproductive/Obstetrics negative OB ROS                             Anesthesia Physical Anesthesia Plan  ASA: IV  Anesthesia Plan: General   Post-op Pain Management:    Induction: Intravenous  PONV Risk Score and Plan: 2 and Propofol infusion and TIVA  Airway Management Planned: Simple Face Mask and Nasal Cannula  Additional Equipment:   Intra-op Plan:   Post-operative Plan:   Informed Consent: I have reviewed the  patients History and Physical, chart, labs and discussed the procedure including the risks, benefits and alternatives for the proposed anesthesia with the patient or authorized representative who has indicated his/her understanding and acceptance.     Dental advisory given  Plan Discussed with: CRNA  Anesthesia Plan Comments: (Plan Full PPE use  Plan GA with GETA as needed d/w pt -WTP with same after Q&A  D/w pt at increased risk of ETT use -voiced understanding WTP)        Anesthesia Quick Evaluation

## 2019-05-15 NOTE — Interval H&P Note (Signed)
History and Physical Interval Note:  05/15/2019 2:44 PM  [name]  has presented today for surgery, with the diagnosis of dysphagia, epigastric pain, GERD.  The various methods of treatment have been discussed with the patient and family. After consideration of risks, benefits and other options for treatment, the patient has consented to  Procedure(s) with comments: ESOPHAGOGASTRODUODENOSCOPY (EGD) WITH PROPOFOL (N/A) - 3:15pm MALONEY DILATION (N/A) as a surgical intervention.  The patient's history has been reviewed, patient examined, no change in status, stable for surgery.  I have reviewed the patient's chart and labs.  Questions were answered to the patient's satisfaction.     Stephen Baruch   No change.  EGD with esophageal dilation as feasible/appropriate per plan.  Morbid obesity discussed with anesthesia.  The risks, benefits, limitations, alternatives and imponderables have been reviewed with the patient. Potential for esophageal dilation, biopsy, etc. have also been reviewed.  Questions have been answered. All parties agreeable.

## 2019-05-15 NOTE — Anesthesia Postprocedure Evaluation (Signed)
Anesthesia Post Note  Patient: ALBANA SAPERSTEIN  Procedure(s) Performed: ESOPHAGOGASTRODUODENOSCOPY (EGD) WITH PROPOFOL (N/A ) MALONEY DILATION (N/A )  Patient location during evaluation: PACU Anesthesia Type: General Level of consciousness: awake and alert Pain management: pain level controlled Vital Signs Assessment: post-procedure vital signs reviewed and stable Respiratory status: spontaneous breathing, nonlabored ventilation, respiratory function stable and patient connected to nasal cannula oxygen Cardiovascular status: blood pressure returned to baseline and stable Postop Assessment: no apparent nausea or vomiting Anesthetic complications: no     Last Vitals:  Vitals:   05/15/19 1526 05/15/19 1533  BP: 127/83 129/90  Pulse: 93 89  Resp: 16 16  Temp:    SpO2: 96% 96%    Last Pain:  Vitals:   05/15/19 1533  TempSrc: Oral  PainSc: 0-No pain                 Talitha Givens

## 2019-05-15 NOTE — Transfer of Care (Signed)
Immediate Anesthesia Transfer of Care Note  Patient: IVONNA KINNICK  Procedure(s) Performed: ESOPHAGOGASTRODUODENOSCOPY (EGD) WITH PROPOFOL (N/A ) MALONEY DILATION (N/A )  Patient Location: PACU  Anesthesia Type:MAC  Level of Consciousness: awake, alert  and oriented  Airway & Oxygen Therapy: Patient connected to nasal cannula oxygen  Post-op Assessment: Report given to RN, Post -op Vital signs reviewed and stable and Patient moving all extremities X 4  Post vital signs: Reviewed and stable  Last Vitals:  Vitals Value Taken Time  BP 120/68 05/15/19 1515  Temp    Pulse 94 05/15/19 1518  Resp 20 05/15/19 1518  SpO2 94 % 05/15/19 1518  Vitals shown include unvalidated device data.  Last Pain:  Vitals:   05/15/19 1454  TempSrc:   PainSc: 0-No pain      Patients Stated Pain Goal: 5 (57/01/77 9390)  Complications: No apparent anesthesia complications

## 2019-05-16 ENCOUNTER — Other Ambulatory Visit: Payer: Self-pay

## 2019-05-16 ENCOUNTER — Ambulatory Visit: Payer: Medicaid Other | Admitting: Orthopaedic Surgery

## 2019-05-16 DIAGNOSIS — D649 Anemia, unspecified: Secondary | ICD-10-CM

## 2019-05-16 DIAGNOSIS — E538 Deficiency of other specified B group vitamins: Secondary | ICD-10-CM

## 2019-05-16 MED ORDER — FOLIC ACID 1 MG PO TABS: 1.0000 mg | ORAL_TABLET | Freq: Every day | ORAL | 3 refills | Status: DC

## 2019-05-17 ENCOUNTER — Telehealth: Payer: Self-pay | Admitting: Internal Medicine

## 2019-05-17 LAB — SURGICAL PATHOLOGY

## 2019-05-17 NOTE — Telephone Encounter (Signed)
Esophageal biopsy reveals inflammation only.  No evidence of tumor or infection.  Please call and let patient know.  Office follow-up with [place] in 3 months or as already schedule  Continue with plan outlined on the day of the procedure.

## 2019-05-17 NOTE — Telephone Encounter (Signed)
Spoke with pt. Pt notified of results.  

## 2019-05-17 NOTE — Telephone Encounter (Signed)
Pt has ov on 08/15/19.

## 2019-05-18 ENCOUNTER — Encounter (HOSPITAL_COMMUNITY): Payer: Self-pay | Admitting: Internal Medicine

## 2019-06-02 ENCOUNTER — Other Ambulatory Visit: Payer: Medicaid Other

## 2019-06-23 ENCOUNTER — Encounter (HOSPITAL_BASED_OUTPATIENT_CLINIC_OR_DEPARTMENT_OTHER): Payer: Self-pay

## 2019-06-23 ENCOUNTER — Other Ambulatory Visit (HOSPITAL_BASED_OUTPATIENT_CLINIC_OR_DEPARTMENT_OTHER): Payer: Self-pay

## 2019-06-23 DIAGNOSIS — G4709 Other insomnia: Secondary | ICD-10-CM

## 2019-06-24 ENCOUNTER — Other Ambulatory Visit: Payer: Self-pay

## 2019-06-24 ENCOUNTER — Ambulatory Visit
Admission: RE | Admit: 2019-06-24 | Discharge: 2019-06-24 | Disposition: A | Discharge status: Home / Self Care | Payer: Medicaid Other | Source: Ambulatory Visit | Attending: Orthopaedic Surgery | Admitting: Orthopaedic Surgery

## 2019-06-24 ENCOUNTER — Other Ambulatory Visit: Payer: Medicaid Other

## 2019-06-24 DIAGNOSIS — M25562 Pain in left knee: Secondary | ICD-10-CM

## 2019-06-24 DIAGNOSIS — G8929 Other chronic pain: Secondary | ICD-10-CM

## 2019-07-04 ENCOUNTER — Encounter: Payer: Self-pay | Admitting: Orthopaedic Surgery

## 2019-07-04 ENCOUNTER — Ambulatory Visit: Payer: Medicaid Other | Admitting: Orthopaedic Surgery

## 2019-07-04 ENCOUNTER — Other Ambulatory Visit: Payer: Self-pay

## 2019-07-04 VITALS — BP 126/76 | HR 100 | Temp 97.1°F | Ht 66.0 in | Wt >= 6400 oz

## 2019-07-04 DIAGNOSIS — M2242 Chondromalacia patellae, left knee: Secondary | ICD-10-CM

## 2019-07-04 DIAGNOSIS — Z6841 Body Mass Index (BMI) 40.0 and over, adult: Secondary | ICD-10-CM

## 2019-07-04 NOTE — Progress Notes (Signed)
Patient [name]:EXHBZJ OKLA QAZI, female DOB:July 22, 1975, 43 y.o. IRC:[id number]  Chief Complaint  Patient presents with  . Knee Pain    Chronic left knee pain  . Results    reivew MRI     HPI  [name] is a 43 y.o. female who has pain of the left knee.  She has no new trauma.  She has pain more with steps.  She had MRI done and it showed: IMPRESSION: 1. Moderate joint effusion with thickened suprapatellar plica. 2. Slight chondromalacia of the patellofemoral and medial compartments.   I have explained the findings to her.  She is using a rub which helps.  Body mass index is 65.69 kg/m.  The patient meets the AMA guidelines for Morbid (severe) obesity with a BMI > 40.0 and I have recommended weight loss.   ROS  Review of Systems  Constitutional: Positive for activity change.  Respiratory: Positive for shortness of breath.   Musculoskeletal: Positive for arthralgias, gait problem and joint swelling.  Neurological: Positive for headaches.  All other systems reviewed and are negative.   All other systems reviewed and are negative.  The following is a summary of the past history medically, past history surgically, known current medicines, social history and family history.  This information is gathered electronically by the computer from prior information and documentation.  I review this each visit and have found including this information at this point in the chart is beneficial and informative.    Past Medical History:  Diagnosis Date  . Asthma   . Bipolar disorder ([place])   . Concussion   . DDD (degenerative disc disease), lumbar   . Diabetes mellitus without complication ([place])   . Dyslipidemia (high LDL; low HDL) 03/27/2016  . Dyspnea    with exertion  . Elevated serum creatinine 03/27/2016  . Fibromyalgia   . GERD (gastroesophageal reflux disease)   . Headache(784.0)   . Hypertension   . Impaired exercise tolerance   . Mental disorder    anxiety  . Morbid obesity  with body mass index (BMI) of 60.0 to 69.9 in adult [facility])   . Other sleep apnea    never been diagnosed but states she wakes gasping for air  . Pneumonia 2015  . Spondylosis   . Vitamin D deficiency 03/27/2016    Past Surgical History:  Procedure Laterality Date  . CYST REMOVAL HAND Left   . ESOPHAGEAL DILATION    . ESOPHAGOGASTRODUODENOSCOPY  2006   Dr. Rehman:small hh, esophageal dilation.  . ESOPHAGOGASTRODUODENOSCOPY (EGD) WITH PROPOFOL N/A 05/15/2019   Procedure: ESOPHAGOGASTRODUODENOSCOPY (EGD) WITH PROPOFOL;  Surgeon: Daneil Dolin, MD;  [facility];  Service: Endoscopy;  Laterality: N/A;  3:15pm  . FOOT SURGERY Right    'knot removed"  . MALONEY DILATION N/A 05/15/2019   Procedure: Venia Minks DILATION;  Surgeon: Daneil Dolin, MD;  [facility];  Service: Endoscopy;  Laterality: N/A;  . MULTIPLE EXTRACTIONS WITH ALVEOLOPLASTY N/A 05/29/2016   Procedure: MULTIPLE EXTRACTIONS;  Surgeon: Diona Browner, DDS;  Location: [place];  Service: Oral Surgery;  Laterality: N/A;  . WISDOM TOOTH EXTRACTION      Family History  Problem Relation Age of Onset  . Cancer Maternal Grandmother        lung, back  . Diabetes Paternal Grandmother   . Heart disease Paternal Grandmother   . Diabetes Paternal Grandfather   . Heart disease Paternal Grandfather   . Stroke Paternal Grandfather     Social  History Social History   Tobacco Use  . Smoking status: Current Every Day Smoker    Packs/day: 1.00    Years: 10.00    Pack years: 10.00    Types: Cigarettes  . Smokeless tobacco: Never Used  Substance Use Topics  . Alcohol use: Not Currently  . Drug use: No    Allergies  Allergen Reactions  . Penicillins Rash     Has patient had a PCN reaction causing immediate rash, facial/tongue/throat swelling, SOB or lightheadedness with hypotension:  # # YES # #  Has patient had a PCN reaction causing severe rash involving mucus membranes or skin necrosis:  # # YES # #  Has  patient had a PCN reaction that required hospitalization   .No Has patient had a PCN reaction occurring within the last 10 years: No If all of the above answers are "NO", then may proceed with Cephalosporin use.   . Sulfa Antibiotics Anaphylaxis, Hives and Swelling    Swelling in hands/legs DIFFICULTY SWALLOWING  . Latex Other (See Comments)    Red at site of latex bandage  . Nisoldipine   . Other Other (See Comments)    Pt says pain meds make her nauseous     Current Outpatient Medications  Medication Sig Dispense Refill  . albuterol (VENTOLIN HFA) 108 (90 Base) MCG/ACT inhaler Inhale 2 puffs into the lungs every 4 (four) hours as needed for wheezing or shortness of breath.    Marland Kitchen atorvastatin (LIPITOR) 20 MG tablet Take 20 mg by mouth every evening.     . busPIRone (BUSPAR) 10 MG tablet Take 10 mg by mouth 3 (three) times daily.    . fluticasone (FLOVENT HFA) 220 MCG/ACT inhaler Inhale 1 puff into the lungs 2 (two) times daily.    . folic acid (FOLVITE) 1 MG tablet Take 1 tablet (1 mg total) by mouth daily. 30 tablet 3  . gabapentin (NEURONTIN) 400 MG capsule Take 400 mg by mouth 3 (three) times daily.    . hydrochlorothiazide (HYDRODIURIL) 25 MG tablet Take 25 mg by mouth daily.    Marland Kitchen lamoTRIgine (LAMICTAL) 100 MG tablet Take 100 mg by mouth 2 (two) times daily.    Marland Kitchen lubiprostone (AMITIZA) 24 MCG capsule Take 1 capsule (24 mcg total) by mouth 2 (two) times daily with a meal. 60 capsule 3  . methocarbamol (ROBAXIN) 750 MG tablet Take 750 mg by mouth 4 (four) times daily as needed for muscle spasms.     Marland Kitchen omeprazole (PRILOSEC) 40 MG capsule Take 40 mg by mouth 2 (two) times daily.    . ondansetron (ZOFRAN) 4 MG tablet Take 4 mg by mouth 3 (three) times daily as needed for nausea or vomiting.    . potassium chloride (KLOR-CON) 10 MEQ tablet Take 2 tablets (20 mEq total) by mouth 2 (two) times daily for 2 days. 8 tablet 0  . prazosin (MINIPRESS) 2 MG capsule Take 2 mg by mouth at bedtime.     . Prenatal Vit-Fe Fumarate-FA (PRENATAL COMPLETE PO) Take 1 tablet by mouth daily.     . QUEtiapine (SEROQUEL) 200 MG tablet Take 200 mg by mouth at bedtime.    . sucralfate (CARAFATE) 1 GM/10ML suspension Take 10 mLs (1 g total) by mouth 4 (four) times daily -  with meals and at bedtime. 420 mL 1  . topiramate (TOPAMAX) 100 MG tablet Take 100 mg by mouth daily.    . Vitamin D, Ergocalciferol, (DRISDOL) 1.25 MG (50000 UT) CAPS capsule  Take 50,000 Units by mouth every 7 (seven) days.     No current facility-administered medications for this visit.     Physical Exam  Blood pressure 126/76, pulse 100, temperature (!) 97.1 F (36.2 C), height 5\' 6"  (1.676 m), weight (!) 407 lb (184.6 kg).  Constitutional: overall normal hygiene, normal nutrition, well developed, normal grooming, normal body habitus. Assistive device:none  Musculoskeletal: gait and station Limp left, muscle tone and strength are normal, no tremors or atrophy is present.  .  Neurological: coordination overall normal.  Deep tendon reflex/nerve stretch intact.  Sensation normal.  Cranial nerves II-XII intact.   Skin:   Normal overall no scars, lesions, ulcers or rashes. No psoriasis.  Psychiatric: Alert and oriented x 3.  Recent memory intact, remote memory unclear.  Normal mood and affect. Well groomed.  Good eye contact.  Cardiovascular: overall no swelling, no varicosities, no edema bilaterally, normal temperatures of the legs and arms, no clubbing, cyanosis and good capillary refill.  Lymphatic: palpation is normal.  Left knee is tender, ROM 0 to 110, stable, crepitus, slight effusion, slight limp left, NV intact.  All other systems reviewed and are negative   The patient has been educated about the nature of the problem(s) and counseled on treatment options.  The patient appeared to understand what I have discussed and is in agreement with it.  Encounter Diagnoses  Name Primary?  . Chondromalacia patellae, left  knee Yes  . Body mass index 60.0-69.9, adult ([facility])   . Morbid obesity (H[facility])     PROCEDURE NOTE:  The patient requests injections of the left knee , verbal consent was obtained.  The left knee was prepped appropriately after time out was performed.   Sterile technique was observed and injection of 1 cc of Depo-Medrol 40 mg with several cc's of plain xylocaine. Anesthesia was provided by ethyl chloride and a 20-gauge needle was used to inject the knee area. The injection was tolerated well.  A band aid dressing was applied.  The patient was advised to apply ice later today and tomorrow to the injection sight as needed.  PLAN Call if any problems.  Precautions discussed.  Continue current medications.   Return to clinic 1 month   Electronically Signed Darreld McleanWayne Callie Bunyard, MD 12/22/20203:13 PM

## 2019-07-19 ENCOUNTER — Other Ambulatory Visit (HOSPITAL_COMMUNITY)
Admission: RE | Admit: 2019-07-19 | Discharge: 2019-07-19 | Disposition: A | Discharge status: Home / Self Care | Payer: Medicaid Other | Source: Ambulatory Visit | Attending: Neurology | Admitting: Neurology

## 2019-07-19 ENCOUNTER — Other Ambulatory Visit: Payer: Self-pay

## 2019-07-19 DIAGNOSIS — Z01812 Encounter for preprocedural laboratory examination: Secondary | ICD-10-CM | POA: Insufficient documentation

## 2019-07-19 DIAGNOSIS — Z20822 Contact with and (suspected) exposure to covid-19: Secondary | ICD-10-CM | POA: Diagnosis not present

## 2019-07-19 LAB — SARS CORONAVIRUS 2 (TAT 6-24 HRS): SARS Coronavirus 2: NEGATIVE

## 2019-07-23 ENCOUNTER — Other Ambulatory Visit: Payer: Self-pay

## 2019-07-23 ENCOUNTER — Ambulatory Visit: Payer: Medicaid Other | Attending: Family Medicine | Admitting: Neurology

## 2019-07-23 DIAGNOSIS — G4733 Obstructive sleep apnea (adult) (pediatric): Secondary | ICD-10-CM | POA: Diagnosis not present

## 2019-07-23 DIAGNOSIS — Z7951 Long term (current) use of inhaled steroids: Secondary | ICD-10-CM | POA: Diagnosis not present

## 2019-07-23 DIAGNOSIS — G4700 Insomnia, unspecified: Secondary | ICD-10-CM | POA: Insufficient documentation

## 2019-07-23 DIAGNOSIS — Z79899 Other long term (current) drug therapy: Secondary | ICD-10-CM | POA: Insufficient documentation

## 2019-07-23 DIAGNOSIS — G4709 Other insomnia: Secondary | ICD-10-CM

## 2019-08-01 ENCOUNTER — Other Ambulatory Visit: Payer: Self-pay

## 2019-08-01 ENCOUNTER — Ambulatory Visit: Payer: Medicaid Other | Admitting: Orthopaedic Surgery

## 2019-08-01 ENCOUNTER — Encounter: Payer: Self-pay | Admitting: Orthopaedic Surgery

## 2019-08-01 VITALS — Temp 97.2°F | Ht 66.0 in | Wt >= 6400 oz

## 2019-08-01 DIAGNOSIS — M2242 Chondromalacia patellae, left knee: Secondary | ICD-10-CM | POA: Diagnosis not present

## 2019-08-01 DIAGNOSIS — Z6841 Body Mass Index (BMI) 40.0 and over, adult: Secondary | ICD-10-CM | POA: Diagnosis not present

## 2019-08-01 NOTE — Progress Notes (Signed)
Patient AT:[name], female DOB:[birth year], 44 y.o. GUR:[id number]  Chief Complaint  Patient presents with  . Knee Pain    Left knee    HPI  [name] is a 44 y.o. female who has left knee pain. She is somewhat better after the injection. She still has swelling and some pain but it is better. She has no giving way. She has no redness.  She is using her walker.   Body mass index is 66.34 kg/m.  The patient meets the AMA guidelines for Morbid (severe) obesity with a BMI > 40.0 and I have recommended weight loss.   ROS  Review of Systems  Constitutional: Positive for activity change.  Respiratory: Positive for shortness of breath.   Musculoskeletal: Positive for arthralgias, gait problem and joint swelling.  Neurological: Positive for headaches.  All other systems reviewed and are negative.   All other systems reviewed and are negative.  The following is a summary of the past history medically, past history surgically, known current medicines, social history and family history.  This information is gathered electronically by the computer from prior information and documentation.  I review this each visit and have found including this information at this point in the chart is beneficial and informative.    Past Medical History:  Diagnosis Date  . Asthma   . Bipolar disorder (H[facility])   . Concussion   . DDD (degenerative disc disease), lumbar   . Diabetes mellitus without complication ([facility])   . Dyslipidemia (high LDL; low HDL) 03/27/2016  . Dyspnea    with exertion  . Elevated serum creatinine 03/27/2016  . Fibromyalgia   . GERD (gastroesophageal reflux disease)   . Headache(784.0)   . Hypertension   . Impaired exercise tolerance   . Mental disorder    anxiety  . Morbid obesity with body mass index (BMI) of 60.0 to 69.9 in adult [facility])   . Other sleep apnea    never been diagnosed but states she wakes gasping for air  . Pneumonia 2015  . Spondylosis   . Vitamin D  deficiency 03/27/2016    Past Surgical History:  Procedure Laterality Date  . CYST REMOVAL HAND Left   . ESOPHAGEAL DILATION    . ESOPHAGOGASTRODUODENOSCOPY  2006   Dr. Rehman:small hh, esophageal dilation.  . ESOPHAGOGASTRODUODENOSCOPY (EGD) WITH PROPOFOL N/A 05/15/2019   Procedure: ESOPHAGOGASTRODUODENOSCOPY (EGD) WITH PROPOFOL;  Surgeon: Corbin Ade, MD;  [facility];  Service: Endoscopy;  Laterality: N/A;  3:15pm  . FOOT SURGERY Right    'knot removed"  . MALONEY DILATION N/A 05/15/2019   Procedure: Elease Hashimoto DILATION;  Surgeon: Corbin Ade, MD;  [facility];  Service: Endoscopy;  Laterality: N/A;  . MULTIPLE EXTRACTIONS WITH ALVEOLOPLASTY N/A 05/29/2016   Procedure: MULTIPLE EXTRACTIONS;  Surgeon: Ocie Doyne, DDS;  Location: [facility];  Service: Oral Surgery;  Laterality: N/A;  . WISDOM TOOTH EXTRACTION      Family History  Problem Relation Age of Onset  . Cancer Maternal Grandmother        lung, back  . Diabetes Paternal Grandmother   . Heart disease Paternal Grandmother   . Diabetes Paternal Grandfather   . Heart disease Paternal Grandfather   . Stroke Paternal Grandfather     Social History Social History   Tobacco Use  . Smoking status: Current Every Day Smoker    Packs/day: 1.00    Years: 10.00    Pack years: 10.00    Types:  Cigarettes  . Smokeless tobacco: Never Used  Substance Use Topics  . Alcohol use: Not Currently  . Drug use: No    Allergies  Allergen Reactions  . Penicillins Rash     Has patient had a PCN reaction causing immediate rash, facial/tongue/throat swelling, SOB or lightheadedness with hypotension:  # # YES # #  Has patient had a PCN reaction causing severe rash involving mucus membranes or skin necrosis:  # # YES # #  Has patient had a PCN reaction that required hospitalization   .No Has patient had a PCN reaction occurring within the last 10 years: No If all of the above answers are "NO", then may proceed with  Cephalosporin use.   . Sulfa Antibiotics Anaphylaxis, Hives and Swelling    Swelling in hands/legs DIFFICULTY SWALLOWING  . Latex Other (See Comments)    Red at site of latex bandage  . Nisoldipine   . Other Other (See Comments)    Pt says pain meds make her nauseous     Current Outpatient Medications  Medication Sig Dispense Refill  . albuterol (VENTOLIN HFA) 108 (90 Base) MCG/ACT inhaler Inhale 2 puffs into the lungs every 4 (four) hours as needed for wheezing or shortness of breath.    Marland Kitchen atorvastatin (LIPITOR) 20 MG tablet Take 20 mg by mouth every evening.     . busPIRone (BUSPAR) 10 MG tablet Take 10 mg by mouth 3 (three) times daily.    . fluticasone (FLOVENT HFA) 220 MCG/ACT inhaler Inhale 1 puff into the lungs 2 (two) times daily.    . folic acid (FOLVITE) 1 MG tablet Take 1 tablet (1 mg total) by mouth daily. 30 tablet 3  . gabapentin (NEURONTIN) 400 MG capsule Take 400 mg by mouth 3 (three) times daily.    . hydrochlorothiazide (HYDRODIURIL) 25 MG tablet Take 25 mg by mouth daily.    Marland Kitchen lamoTRIgine (LAMICTAL) 100 MG tablet Take 100 mg by mouth 2 (two) times daily.    Marland Kitchen lubiprostone (AMITIZA) 24 MCG capsule Take 1 capsule (24 mcg total) by mouth 2 (two) times daily with a meal. 60 capsule 3  . methocarbamol (ROBAXIN) 750 MG tablet Take 750 mg by mouth 4 (four) times daily as needed for muscle spasms.     Marland Kitchen omeprazole (PRILOSEC) 40 MG capsule Take 40 mg by mouth 2 (two) times daily.    . ondansetron (ZOFRAN) 4 MG tablet Take 4 mg by mouth 3 (three) times daily as needed for nausea or vomiting.    . potassium chloride (KLOR-CON) 10 MEQ tablet Take 2 tablets (20 mEq total) by mouth 2 (two) times daily for 2 days. 8 tablet 0  . prazosin (MINIPRESS) 2 MG capsule Take 2 mg by mouth at bedtime.    . Prenatal Vit-Fe Fumarate-FA (PRENATAL COMPLETE PO) Take 1 tablet by mouth daily.     . QUEtiapine (SEROQUEL) 200 MG tablet Take 200 mg by mouth at bedtime.    . sucralfate (CARAFATE) 1  GM/10ML suspension Take 10 mLs (1 g total) by mouth 4 (four) times daily -  with meals and at bedtime. 420 mL 1  . topiramate (TOPAMAX) 100 MG tablet Take 100 mg by mouth daily.    . Vitamin D, Ergocalciferol, (DRISDOL) 1.25 MG (50000 UT) CAPS capsule Take 50,000 Units by mouth every 7 (seven) days.     No current facility-administered medications for this visit.     Physical Exam  Temperature (!) 97.2 F (36.2 C), height 5'  6" (1.676 m), weight (!) 411 lb (186.4 kg).  Constitutional: overall normal hygiene, normal nutrition, well developed, normal grooming, normal body habitus. Assistive device:walker  Musculoskeletal: gait and station Limp left, muscle tone and strength are normal, no tremors or atrophy is present.  .  Neurological: coordination overall normal.  Deep tendon reflex/nerve stretch intact.  Sensation normal.  Cranial nerves II-XII intact.   Skin:   Normal overall no scars, lesions, ulcers or rashes. No psoriasis.  Psychiatric: Alert and oriented x 3.  Recent memory intact, remote memory unclear.  Normal mood and affect. Well groomed.  Good eye contact.  Cardiovascular: overall no swelling, no varicosities, no edema bilaterally, normal temperatures of the legs and arms, no clubbing, cyanosis and good capillary refill.  Lymphatic: palpation is normal.  All other systems reviewed and are negative   Left knee has slight effusion, crepitus and ROM of 0 to 110, stable, limp to the left, uses walker.  NV intact.  The patient has been educated about the nature of the problem(s) and counseled on treatment options.  The patient appeared to understand what I have discussed and is in agreement with it.  Encounter Diagnoses  Name Primary?  . Chondromalacia patellae, left knee Yes  . Body mass index 60.0-69.9, adult (HCC)   . Morbid obesity (HCC)     PLAN Call if any problems.  Precautions discussed.  Continue current medications.   Return to clinic 1  month   Electronically Signed Darreld Mclean, MD 1/19/20212:16 PM

## 2019-08-05 NOTE — Procedures (Signed)
[place] A. Merlene Laughter, MD     [email or web address]             NOCTURNAL POLYSOMNOGRAPHY   LOCATION: [facility]   Patient Name: [name], [name] Date: 07/23/2019 Gender: Female D.O.B: 24-Mar-1976 Age (years): 43 Referring Provider: Lemmie Evens Height (inches): 66 Interpreting Physician: Phillips Odor MD, ABSM Weight (lbs): 407 RPSGT: Rosebud Poles BMI: 66 MRN: [id number] Neck Size: 19.50 CLINICAL INFORMATION Sleep Study Type: NPSG     Indication for sleep study: N/A     Epworth Sleepiness Score: 19     SLEEP STUDY TECHNIQUE As per the AASM Manual for the Scoring of Sleep and Associated Events v2.3 (April 2016) with a hypopnea requiring 4% desaturations.  The channels recorded and monitored were frontal, central and occipital EEG, electrooculogram (EOG), submentalis EMG (chin), nasal and oral airflow, thoracic and abdominal wall motion, anterior tibialis EMG, snore microphone, electrocardiogram, and pulse oximetry.  MEDICATIONS Medications self-administered by patient taken the night of the study : N/A  Current Outpatient Medications:  .  albuterol (VENTOLIN HFA) 108 (90 Base) MCG/ACT inhaler, Inhale 2 puffs into the lungs every 4 (four) hours as needed for wheezing or shortness of breath., Disp: , Rfl:  .  atorvastatin (LIPITOR) 20 MG tablet, Take 20 mg by mouth every evening. , Disp: , Rfl:  .  busPIRone (BUSPAR) 10 MG tablet, Take 10 mg by mouth 3 (three) times daily., Disp: , Rfl:  .  fluticasone (FLOVENT HFA) 220 MCG/ACT inhaler, Inhale 1 puff into the lungs 2 (two) times daily., Disp: , Rfl:  .  folic acid (FOLVITE) 1 MG tablet, Take 1 tablet (1 mg total) by mouth daily., Disp: 30 tablet, Rfl: 3 .  gabapentin (NEURONTIN) 400 MG capsule, Take 400 mg by mouth 3 (three) times daily., Disp: , Rfl:  .  hydrochlorothiazide (HYDRODIURIL) 25 MG tablet, Take 25 mg by mouth daily., Disp: , Rfl:  .  lamoTRIgine (LAMICTAL) 100 MG tablet, Take  100 mg by mouth 2 (two) times daily., Disp: , Rfl:  .  lubiprostone (AMITIZA) 24 MCG capsule, Take 1 capsule (24 mcg total) by mouth 2 (two) times daily with a meal., Disp: 60 capsule, Rfl: 3 .  methocarbamol (ROBAXIN) 750 MG tablet, Take 750 mg by mouth 4 (four) times daily as needed for muscle spasms. , Disp: , Rfl:  .  omeprazole (PRILOSEC) 40 MG capsule, Take 40 mg by mouth 2 (two) times daily., Disp: , Rfl:  .  ondansetron (ZOFRAN) 4 MG tablet, Take 4 mg by mouth 3 (three) times daily as needed for nausea or vomiting., Disp: , Rfl:  .  potassium chloride (KLOR-CON) 10 MEQ tablet, Take 2 tablets (20 mEq total) by mouth 2 (two) times daily for 2 days., Disp: 8 tablet, Rfl: 0 .  prazosin (MINIPRESS) 2 MG capsule, Take 2 mg by mouth at bedtime., Disp: , Rfl:  .  Prenatal Vit-Fe Fumarate-FA (PRENATAL COMPLETE PO), Take 1 tablet by mouth daily. , Disp: , Rfl:  .  QUEtiapine (SEROQUEL) 200 MG tablet, Take 200 mg by mouth at bedtime., Disp: , Rfl:  .  sucralfate (CARAFATE) 1 GM/10ML suspension, Take 10 mLs (1 g total) by mouth 4 (four) times daily -  with meals and at bedtime., Disp: 420 mL, Rfl: 1 .  topiramate (TOPAMAX) 100 MG tablet, Take 100 mg by mouth daily., Disp: , Rfl:  .  Vitamin D, Ergocalciferol, (DRISDOL) 1.25 MG (50000 UT) CAPS capsule, Take 50,000 Units by mouth every 7 (  seven) days., Disp: , Rfl:      SLEEP ARCHITECTURE The study was initiated at 10:40:45 PM and ended at 5:23:26 AM.  Sleep onset time was 21.8 minutes and the sleep efficiency was 73.1%%. The total sleep time was 294.4 minutes.  Stage REM latency was N/A minutes.  The patient spent 5.4%% of the night in stage N1 sleep, 85.1%% in stage N2 sleep, 9.5%% in stage N3 and 0% in REM.  Alpha intrusion was absent.  Supine sleep was 0.00%.  RESPIRATORY PARAMETERS The overall apnea/hypopnea index (AHI) was 59.7 per hour. There were 31 total apneas, including 31 obstructive, 0 central and 0 mixed apneas. There were 262  hypopneas and 1 RERAs.  The AHI during Stage REM sleep was N/A per hour.  AHI while supine was N/A per hour.  The mean oxygen saturation was 89.6%. The minimum SpO2 during sleep was 84.0%.  moderate snoring was noted during this study.  CARDIAC DATA The 2 lead EKG demonstrated sinus rhythm. The mean heart rate was 95.4 beats per minute. Other EKG findings include: None.  LEG MOVEMENT DATA The total PLMS were 0 with a resulting PLMS index of 0.0. Associated arousal with leg movement index was 0.0.  IMPRESSIONS 1. Severe obstructive sleep apnea is documented with this recording. AutoPAP 10-20 is recommended. 2. 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    Argie Ramming, MD Diplomate, American Board of Sleep Medicine.  ELECTRONICALLY SIGNED ON:  08/05/2019, 12:53 PM [facility] PH: [phone]   FX: [phone] ACCREDITED BY THE AMERICAN ACADEMY OF SLEEP MEDICINE

## 2019-08-08 ENCOUNTER — Other Ambulatory Visit: Payer: Self-pay

## 2019-08-08 DIAGNOSIS — D649 Anemia, unspecified: Secondary | ICD-10-CM

## 2019-08-08 DIAGNOSIS — E538 Deficiency of other specified B group vitamins: Secondary | ICD-10-CM

## 2019-08-10 ENCOUNTER — Other Ambulatory Visit: Payer: Self-pay | Admitting: Gastroenterology

## 2019-08-15 ENCOUNTER — Telehealth: Payer: Self-pay | Admitting: Gastroenterology

## 2019-08-15 ENCOUNTER — Telehealth: Payer: Self-pay

## 2019-08-15 ENCOUNTER — Encounter: Payer: Self-pay | Admitting: Internal Medicine

## 2019-08-15 ENCOUNTER — Other Ambulatory Visit: Payer: Self-pay

## 2019-08-15 ENCOUNTER — Ambulatory Visit (INDEPENDENT_AMBULATORY_CARE_PROVIDER_SITE_OTHER): Payer: Medicaid Other | Admitting: Gastroenterology

## 2019-08-15 ENCOUNTER — Encounter: Payer: Self-pay | Admitting: Gastroenterology

## 2019-08-15 DIAGNOSIS — K219 Gastro-esophageal reflux disease without esophagitis: Secondary | ICD-10-CM

## 2019-08-15 NOTE — Telephone Encounter (Signed)
PATIENT WAS A NO SHOW AND LETTER SENT  °

## 2019-08-15 NOTE — Telephone Encounter (Signed)
Called pt at 10:27am to start 10:00am Doxy visit, no answer, LMOVM.   Please no show pt per [facility].

## 2019-08-15 NOTE — Telephone Encounter (Signed)
PATIENT NO SHOWED AND LETTER SENT  °

## 2019-08-15 NOTE — Progress Notes (Signed)
No show

## 2019-08-29 ENCOUNTER — Encounter: Payer: Self-pay | Admitting: Orthopaedic Surgery

## 2019-08-29 ENCOUNTER — Other Ambulatory Visit: Payer: Self-pay

## 2019-08-29 ENCOUNTER — Ambulatory Visit: Payer: Medicaid Other | Admitting: Orthopaedic Surgery

## 2019-08-29 DIAGNOSIS — Z7901 Long term (current) use of anticoagulants: Secondary | ICD-10-CM

## 2019-08-29 DIAGNOSIS — Z6841 Body Mass Index (BMI) 40.0 and over, adult: Secondary | ICD-10-CM

## 2019-08-29 DIAGNOSIS — G8929 Other chronic pain: Secondary | ICD-10-CM | POA: Diagnosis not present

## 2019-08-29 DIAGNOSIS — F17210 Nicotine dependence, cigarettes, uncomplicated: Secondary | ICD-10-CM

## 2019-08-29 DIAGNOSIS — M25562 Pain in left knee: Secondary | ICD-10-CM | POA: Diagnosis not present

## 2019-08-29 NOTE — Patient Instructions (Signed)
Steps to Quit Smoking Smoking tobacco is the leading cause of preventable death. It can affect almost every organ in the body. Smoking puts you and people around you at risk for many serious, long-lasting (chronic) diseases. Quitting smoking can be hard, but it is one of the best things that you can do for your health. It is never too late to quit. How do I get ready to quit? When you decide to quit smoking, make a plan to help you succeed. Before you quit:  Pick a date to quit. Set a date within the next 2 weeks to give you time to prepare.  Write down the reasons why you are quitting. Keep this list in places where you will see it often.  Tell your family, friends, and co-workers that you are quitting. Their support is important.  Talk with your doctor about the choices that may help you quit.  Find out if your health insurance will pay for these treatments.  Know the people, places, things, and activities that make you want to smoke (triggers). Avoid them. What first steps can I take to quit smoking?  Throw away all cigarettes at home, at work, and in your car.  Throw away the things that you use when you smoke, such as ashtrays and lighters.  Clean your car. Make sure to empty the ashtray.  Clean your home, including curtains and carpets. What can I do to help me quit smoking? Talk with your doctor about taking medicines and seeing a counselor at the same time. You are more likely to succeed when you do both.  If you are pregnant or breastfeeding, talk with your doctor about counseling or other ways to quit smoking. Do not take medicine to help you quit smoking unless your doctor tells you to do so. To quit smoking: Quit right away  Quit smoking totally, instead of slowly cutting back on how much you smoke over a period of time.  Go to counseling. You are more likely to quit if you go to counseling sessions regularly. Take medicine You may take medicines to help you quit. Some  medicines need a prescription, and some you can buy over-the-counter. Some medicines may contain a drug called nicotine to replace the nicotine in cigarettes. Medicines may:  Help you to stop having the desire to smoke (cravings).  Help to stop the problems that come when you stop smoking (withdrawal symptoms). Your doctor may ask you to use:  Nicotine patches, gum, or lozenges.  Nicotine inhalers or sprays.  Non-nicotine medicine that is taken by mouth. Find resources Find resources and other ways to help you quit smoking and remain smoke-free after you quit. These resources are most helpful when you use them often. They include:  Online chats with a counselor.  Phone quitlines.  Printed self-help materials.  Support groups or group counseling.  Text messaging programs.  Mobile phone apps. Use apps on your mobile phone or tablet that can help you stick to your quit plan. There are many free apps for mobile phones and tablets as well as websites. Examples include Quit Guide from the CDC and smokefree.gov  What things can I do to make it easier to quit?   Talk to your family and friends. Ask them to support and encourage you.  Call a phone quitline (1-800-QUIT-NOW), reach out to support groups, or work with a counselor.  Ask people who smoke to not smoke around you.  Avoid places that make you want to smoke,   such as: ? Bars. ? Parties. ? Smoke-break areas at work.  Spend time with people who do not smoke.  Lower the stress in your life. Stress can make you want to smoke. Try these things to help your stress: ? Getting regular exercise. ? Doing deep-breathing exercises. ? Doing yoga. ? Meditating. ? Doing a body scan. To do this, close your eyes, focus on one area of your body at a time from head to toe. Notice which parts of your body are tense. Try to relax the muscles in those areas. How will I feel when I quit smoking? Day 1 to 3 weeks Within the first 24 hours,  you may start to have some problems that come from quitting tobacco. These problems are very bad 2-3 days after you quit, but they do not often last for more than 2-3 weeks. You may get these symptoms:  Mood swings.  Feeling restless, nervous, angry, or annoyed.  Trouble concentrating.  Dizziness.  Strong desire for high-sugar foods and nicotine.  Weight gain.  Trouble pooping (constipation).  Feeling like you may vomit (nausea).  Coughing or a sore throat.  Changes in how the medicines that you take for other issues work in your body.  Depression.  Trouble sleeping (insomnia). Week 3 and afterward After the first 2-3 weeks of quitting, you may start to notice more positive results, such as:  Better sense of smell and taste.  Less coughing and sore throat.  Slower heart rate.  Lower blood pressure.  Clearer skin.  Better breathing.  Fewer sick days. Quitting smoking can be hard. Do not give up if you fail the first time. Some people need to try a few times before they succeed. Do your best to stick to your quit plan, and talk with your doctor if you have any questions or concerns. Summary  Smoking tobacco is the leading cause of preventable death. Quitting smoking can be hard, but it is one of the best things that you can do for your health.  When you decide to quit smoking, make a plan to help you succeed.  Quit smoking right away, not slowly over a period of time.  When you start quitting, seek help from your doctor, family, or friends. This information is not intended to replace advice given to you by your health care provider. Make sure you discuss any questions you have with your health care provider. Document Revised: 03/24/2019 Document Reviewed: 09/17/2018 Elsevier Patient Education  2020 Elsevier Inc.  

## 2019-08-29 NOTE — Progress Notes (Signed)
CC:  I have pain of my left knee. I would like an injection.  The patient has chronic pain of the left knee.  There is no recent trauma.  There is no redness.  Injections in the past have helped.  The knee has no redness, has an effusion and crepitus present.  ROM of the left knee is 0-100.  Encounter Diagnoses  Name Primary?  . Chronic pain of left knee Yes  . Body mass index 60.0-69.9, adult (HCC)   . Morbid obesity (HCC)   . Anticoagulated on Coumadin   . Nicotine dependence, cigarettes, uncomplicated      Return: 1 month  PROCEDURE NOTE:  The patient requests injections of the left knee, verbal consent was obtained.  The left knee was prepped appropriately after time out was performed.   Sterile technique was observed and injection of 1 cc of Depo-Medrol 40 mg with several cc's of plain xylocaine. Anesthesia was provided by ethyl chloride and a 20-gauge needle was used to inject the knee area. The injection was tolerated well.  A band aid dressing was applied.  The patient was advised to apply ice later today and tomorrow to the injection sight as needed.  Electronically Signed Darreld Mclean, MD 2/16/202110:00 AM

## 2019-09-26 ENCOUNTER — Ambulatory Visit: Payer: Medicaid Other | Admitting: Orthopaedic Surgery

## 2019-09-28 ENCOUNTER — Ambulatory Visit: Payer: Medicaid Other | Admitting: Orthopaedic Surgery

## 2019-09-28 ENCOUNTER — Encounter: Payer: Self-pay | Admitting: Orthopaedic Surgery

## 2019-09-28 ENCOUNTER — Other Ambulatory Visit: Payer: Self-pay

## 2019-09-28 DIAGNOSIS — G8929 Other chronic pain: Secondary | ICD-10-CM

## 2019-09-28 DIAGNOSIS — Z6841 Body Mass Index (BMI) 40.0 and over, adult: Secondary | ICD-10-CM

## 2019-09-28 DIAGNOSIS — M25562 Pain in left knee: Secondary | ICD-10-CM

## 2019-09-28 DIAGNOSIS — F17210 Nicotine dependence, cigarettes, uncomplicated: Secondary | ICD-10-CM

## 2019-09-28 NOTE — Progress Notes (Signed)
PROCEDURE NOTE:  The patient requests injections of the left knee , verbal consent was obtained.  The left knee was prepped appropriately after time out was performed.   Sterile technique was observed and injection of 1 cc of Depo-Medrol 40 mg with several cc's of plain xylocaine. Anesthesia was provided by ethyl chloride and a 20-gauge needle was used to inject the knee area. The injection was tolerated well.  A band aid dressing was applied.  The patient was advised to apply ice later today and tomorrow to the injection sight as needed.  Return in one month.  Encounter Diagnoses  Name Primary?  . Chronic pain of left knee Yes  . Body mass index 60.0-69.9, adult (HCC)   . Morbid obesity (HCC)   . Nicotine dependence, cigarettes, uncomplicated    Electronically Signed Darreld Mclean, MD 3/18/20219:08 AM

## 2019-09-28 NOTE — Patient Instructions (Signed)
Steps to Quit Smoking Smoking tobacco is the leading cause of preventable death. It can affect almost every organ in the body. Smoking puts you and people around you at risk for many serious, long-lasting (chronic) diseases. Quitting smoking can be hard, but it is one of the best things that you can do for your health. It is never too late to quit. How do I get ready to quit? When you decide to quit smoking, make a plan to help you succeed. Before you quit:  Pick a date to quit. Set a date within the next 2 weeks to give you time to prepare.  Write down the reasons why you are quitting. Keep this list in places where you will see it often.  Tell your family, friends, and co-workers that you are quitting. Their support is important.  Talk with your doctor about the choices that may help you quit.  Find out if your health insurance will pay for these treatments.  Know the people, places, things, and activities that make you want to smoke (triggers). Avoid them. What first steps can I take to quit smoking?  Throw away all cigarettes at home, at work, and in your car.  Throw away the things that you use when you smoke, such as ashtrays and lighters.  Clean your car. Make sure to empty the ashtray.  Clean your home, including curtains and carpets. What can I do to help me quit smoking? Talk with your doctor about taking medicines and seeing a counselor at the same time. You are more likely to succeed when you do both.  If you are pregnant or breastfeeding, talk with your doctor about counseling or other ways to quit smoking. Do not take medicine to help you quit smoking unless your doctor tells you to do so. To quit smoking: Quit right away  Quit smoking totally, instead of slowly cutting back on how much you smoke over a period of time.  Go to counseling. You are more likely to quit if you go to counseling sessions regularly. Take medicine You may take medicines to help you quit. Some  medicines need a prescription, and some you can buy over-the-counter. Some medicines may contain a drug called nicotine to replace the nicotine in cigarettes. Medicines may:  Help you to stop having the desire to smoke (cravings).  Help to stop the problems that come when you stop smoking (withdrawal symptoms). Your doctor may ask you to use:  Nicotine patches, gum, or lozenges.  Nicotine inhalers or sprays.  Non-nicotine medicine that is taken by mouth. Find resources Find resources and other ways to help you quit smoking and remain smoke-free after you quit. These resources are most helpful when you use them often. They include:  Online chats with a counselor.  Phone quitlines.  Printed self-help materials.  Support groups or group counseling.  Text messaging programs.  Mobile phone apps. Use apps on your mobile phone or tablet that can help you stick to your quit plan. There are many free apps for mobile phones and tablets as well as websites. Examples include Quit Guide from the CDC and smokefree.gov  What things can I do to make it easier to quit?   Talk to your family and friends. Ask them to support and encourage you.  Call a phone quitline (1-800-QUIT-NOW), reach out to support groups, or work with a counselor.  Ask people who smoke to not smoke around you.  Avoid places that make you want to smoke,   such as: ? Bars. ? Parties. ? Smoke-break areas at work.  Spend time with people who do not smoke.  Lower the stress in your life. Stress can make you want to smoke. Try these things to help your stress: ? Getting regular exercise. ? Doing deep-breathing exercises. ? Doing yoga. ? Meditating. ? Doing a body scan. To do this, close your eyes, focus on one area of your body at a time from head to toe. Notice which parts of your body are tense. Try to relax the muscles in those areas. How will I feel when I quit smoking? Day 1 to 3 weeks Within the first 24 hours,  you may start to have some problems that come from quitting tobacco. These problems are very bad 2-3 days after you quit, but they do not often last for more than 2-3 weeks. You may get these symptoms:  Mood swings.  Feeling restless, nervous, angry, or annoyed.  Trouble concentrating.  Dizziness.  Strong desire for high-sugar foods and nicotine.  Weight gain.  Trouble pooping (constipation).  Feeling like you may vomit (nausea).  Coughing or a sore throat.  Changes in how the medicines that you take for other issues work in your body.  Depression.  Trouble sleeping (insomnia). Week 3 and afterward After the first 2-3 weeks of quitting, you may start to notice more positive results, such as:  Better sense of smell and taste.  Less coughing and sore throat.  Slower heart rate.  Lower blood pressure.  Clearer skin.  Better breathing.  Fewer sick days. Quitting smoking can be hard. Do not give up if you fail the first time. Some people need to try a few times before they succeed. Do your best to stick to your quit plan, and talk with your doctor if you have any questions or concerns. Summary  Smoking tobacco is the leading cause of preventable death. Quitting smoking can be hard, but it is one of the best things that you can do for your health.  When you decide to quit smoking, make a plan to help you succeed.  Quit smoking right away, not slowly over a period of time.  When you start quitting, seek help from your doctor, family, or friends. This information is not intended to replace advice given to you by your health care provider. Make sure you discuss any questions you have with your health care provider. Document Revised: 03/24/2019 Document Reviewed: 09/17/2018 Elsevier Patient Education  2020 Elsevier Inc.  

## 2019-10-26 ENCOUNTER — Encounter: Payer: Self-pay | Admitting: Orthopaedic Surgery

## 2019-10-26 ENCOUNTER — Other Ambulatory Visit: Payer: Self-pay

## 2019-10-26 ENCOUNTER — Ambulatory Visit: Payer: Medicaid Other | Admitting: Orthopaedic Surgery

## 2019-10-26 VITALS — Temp 97.0°F

## 2019-10-26 DIAGNOSIS — F17210 Nicotine dependence, cigarettes, uncomplicated: Secondary | ICD-10-CM

## 2019-10-26 DIAGNOSIS — M25562 Pain in left knee: Secondary | ICD-10-CM | POA: Diagnosis not present

## 2019-10-26 DIAGNOSIS — Z6841 Body Mass Index (BMI) 40.0 and over, adult: Secondary | ICD-10-CM

## 2019-10-26 DIAGNOSIS — G8929 Other chronic pain: Secondary | ICD-10-CM

## 2019-10-26 NOTE — Progress Notes (Signed)
PROCEDURE NOTE:  The patient requests injections of the left knee , verbal consent was obtained.  The left knee was prepped appropriately after time out was performed.   Sterile technique was observed and injection of 1 cc of Depo-Medrol 40 mg with several cc's of plain xylocaine. Anesthesia was provided by ethyl chloride and a 20-gauge needle was used to inject the knee area. The injection was tolerated well.  A band aid dressing was applied.  The patient was advised to apply ice later today and tomorrow to the injection sight as needed.  See in one month.  Encounter Diagnoses  Name Primary?  . Chronic pain of left knee Yes  . Body mass index 60.0-69.9, adult (HCC)   . Morbid obesity (HCC)   . Nicotine dependence, cigarettes, uncomplicated    Call if any problem.  Precautions discussed.   Electronically Signed Darreld Mclean, MD 4/15/20219:39 AM

## 2019-11-23 ENCOUNTER — Encounter: Payer: Self-pay | Admitting: Orthopaedic Surgery

## 2019-11-23 ENCOUNTER — Other Ambulatory Visit: Payer: Self-pay

## 2019-11-23 ENCOUNTER — Ambulatory Visit: Payer: Medicaid Other | Admitting: Orthopaedic Surgery

## 2019-11-23 DIAGNOSIS — G8929 Other chronic pain: Secondary | ICD-10-CM | POA: Diagnosis not present

## 2019-11-23 DIAGNOSIS — M25562 Pain in left knee: Secondary | ICD-10-CM

## 2019-11-23 DIAGNOSIS — F17210 Nicotine dependence, cigarettes, uncomplicated: Secondary | ICD-10-CM

## 2019-11-23 DIAGNOSIS — Z6841 Body Mass Index (BMI) 40.0 and over, adult: Secondary | ICD-10-CM

## 2019-11-23 NOTE — Progress Notes (Signed)
PROCEDURE NOTE:  The patient requests injections of the left knee , verbal consent was obtained.  The left knee was prepped appropriately after time out was performed.   Sterile technique was observed and injection of 1 cc of Depo-Medrol 40 mg with several cc's of plain xylocaine. Anesthesia was provided by ethyl chloride and a 20-gauge needle was used to inject the knee area. The injection was tolerated well.  A band aid dressing was applied.  The patient was advised to apply ice later today and tomorrow to the injection sight as needed.  Return in one month.  Call if any problem.  Precautions discussed.   Electronically Signed Darreld Mclean, MD 5/13/202110:19 AM

## 2019-12-21 ENCOUNTER — Encounter: Payer: Self-pay | Admitting: Orthopaedic Surgery

## 2019-12-21 ENCOUNTER — Ambulatory Visit: Payer: Medicaid Other | Admitting: Orthopaedic Surgery

## 2019-12-21 ENCOUNTER — Other Ambulatory Visit: Payer: Self-pay

## 2019-12-21 DIAGNOSIS — F17210 Nicotine dependence, cigarettes, uncomplicated: Secondary | ICD-10-CM

## 2019-12-21 DIAGNOSIS — G8929 Other chronic pain: Secondary | ICD-10-CM

## 2019-12-21 DIAGNOSIS — M25562 Pain in left knee: Secondary | ICD-10-CM | POA: Diagnosis not present

## 2019-12-21 DIAGNOSIS — Z6841 Body Mass Index (BMI) 40.0 and over, adult: Secondary | ICD-10-CM

## 2019-12-21 NOTE — Patient Instructions (Signed)
Steps to Quit Smoking Smoking tobacco is the leading cause of preventable death. It can affect almost every organ in the body. Smoking puts you and people around you at risk for many serious, long-lasting (chronic) diseases. Quitting smoking can be hard, but it is one of the best things that you can do for your health. It is never too late to quit. How do I get ready to quit? When you decide to quit smoking, make a plan to help you succeed. Before you quit:  Pick a date to quit. Set a date within the next 2 weeks to give you time to prepare.  Write down the reasons why you are quitting. Keep this list in places where you will see it often.  Tell your family, friends, and co-workers that you are quitting. Their support is important.  Talk with your doctor about the choices that may help you quit.  Find out if your health insurance will pay for these treatments.  Know the people, places, things, and activities that make you want to smoke (triggers). Avoid them. What first steps can I take to quit smoking?  Throw away all cigarettes at home, at work, and in your car.  Throw away the things that you use when you smoke, such as ashtrays and lighters.  Clean your car. Make sure to empty the ashtray.  Clean your home, including curtains and carpets. What can I do to help me quit smoking? Talk with your doctor about taking medicines and seeing a counselor at the same time. You are more likely to succeed when you do both.  If you are pregnant or breastfeeding, talk with your doctor about counseling or other ways to quit smoking. Do not take medicine to help you quit smoking unless your doctor tells you to do so. To quit smoking: Quit right away  Quit smoking totally, instead of slowly cutting back on how much you smoke over a period of time.  Go to counseling. You are more likely to quit if you go to counseling sessions regularly. Take medicine You may take medicines to help you quit. Some  medicines need a prescription, and some you can buy over-the-counter. Some medicines may contain a drug called nicotine to replace the nicotine in cigarettes. Medicines may:  Help you to stop having the desire to smoke (cravings).  Help to stop the problems that come when you stop smoking (withdrawal symptoms). Your doctor may ask you to use:  Nicotine patches, gum, or lozenges.  Nicotine inhalers or sprays.  Non-nicotine medicine that is taken by mouth. Find resources Find resources and other ways to help you quit smoking and remain smoke-free after you quit. These resources are most helpful when you use them often. They include:  Online chats with a counselor.  Phone quitlines.  Printed self-help materials.  Support groups or group counseling.  Text messaging programs.  Mobile phone apps. Use apps on your mobile phone or tablet that can help you stick to your quit plan. There are many free apps for mobile phones and tablets as well as websites. Examples include Quit Guide from the CDC and smokefree.gov  What things can I do to make it easier to quit?   Talk to your family and friends. Ask them to support and encourage you.  Call a phone quitline (1-800-QUIT-NOW), reach out to support groups, or work with a counselor.  Ask people who smoke to not smoke around you.  Avoid places that make you want to smoke,   such as: ? Bars. ? Parties. ? Smoke-break areas at work.  Spend time with people who do not smoke.  Lower the stress in your life. Stress can make you want to smoke. Try these things to help your stress: ? Getting regular exercise. ? Doing deep-breathing exercises. ? Doing yoga. ? Meditating. ? Doing a body scan. To do this, close your eyes, focus on one area of your body at a time from head to toe. Notice which parts of your body are tense. Try to relax the muscles in those areas. How will I feel when I quit smoking? Day 1 to 3 weeks Within the first 24 hours,  you may start to have some problems that come from quitting tobacco. These problems are very bad 2-3 days after you quit, but they do not often last for more than 2-3 weeks. You may get these symptoms:  Mood swings.  Feeling restless, nervous, angry, or annoyed.  Trouble concentrating.  Dizziness.  Strong desire for high-sugar foods and nicotine.  Weight gain.  Trouble pooping (constipation).  Feeling like you may vomit (nausea).  Coughing or a sore throat.  Changes in how the medicines that you take for other issues work in your body.  Depression.  Trouble sleeping (insomnia). Week 3 and afterward After the first 2-3 weeks of quitting, you may start to notice more positive results, such as:  Better sense of smell and taste.  Less coughing and sore throat.  Slower heart rate.  Lower blood pressure.  Clearer skin.  Better breathing.  Fewer sick days. Quitting smoking can be hard. Do not give up if you fail the first time. Some people need to try a few times before they succeed. Do your best to stick to your quit plan, and talk with your doctor if you have any questions or concerns. Summary  Smoking tobacco is the leading cause of preventable death. Quitting smoking can be hard, but it is one of the best things that you can do for your health.  When you decide to quit smoking, make a plan to help you succeed.  Quit smoking right away, not slowly over a period of time.  When you start quitting, seek help from your doctor, family, or friends. This information is not intended to replace advice given to you by your health care provider. Make sure you discuss any questions you have with your health care provider. Document Revised: 03/24/2019 Document Reviewed: 09/17/2018 Elsevier Patient Education  2020 Elsevier Inc.  

## 2019-12-21 NOTE — Progress Notes (Signed)
PROCEDURE NOTE:  The patient requests injections of the left knee , verbal consent was obtained.  The left knee was prepped appropriately after time out was performed.   Sterile technique was observed and injection of 1 cc of Depo-Medrol 40 mg with several cc's of plain xylocaine. Anesthesia was provided by ethyl chloride and a 20-gauge needle was used to inject the knee area. The injection was tolerated well.  A band aid dressing was applied.  The patient was advised to apply ice later today and tomorrow to the injection sight as needed.  Return in one month.  Electronically Signed Darreld Mclean, MD 6/10/202110:34 AM

## 2020-01-23 ENCOUNTER — Ambulatory Visit: Payer: Medicaid Other | Admitting: Orthopaedic Surgery

## 2020-01-25 DIAGNOSIS — G4710 Hypersomnia, unspecified: Secondary | ICD-10-CM | POA: Insufficient documentation

## 2020-01-25 DIAGNOSIS — G4733 Obstructive sleep apnea (adult) (pediatric): Secondary | ICD-10-CM | POA: Insufficient documentation

## 2020-01-25 DIAGNOSIS — R5383 Other fatigue: Secondary | ICD-10-CM | POA: Insufficient documentation

## 2020-01-25 DIAGNOSIS — I10 Essential (primary) hypertension: Secondary | ICD-10-CM | POA: Insufficient documentation

## 2020-01-30 ENCOUNTER — Other Ambulatory Visit (HOSPITAL_BASED_OUTPATIENT_CLINIC_OR_DEPARTMENT_OTHER): Payer: Self-pay

## 2020-01-30 DIAGNOSIS — G4733 Obstructive sleep apnea (adult) (pediatric): Secondary | ICD-10-CM

## 2020-02-06 ENCOUNTER — Other Ambulatory Visit: Payer: Self-pay

## 2020-02-06 ENCOUNTER — Ambulatory Visit: Payer: Medicaid Other | Admitting: Orthopaedic Surgery

## 2020-02-06 ENCOUNTER — Encounter: Payer: Self-pay | Admitting: Orthopaedic Surgery

## 2020-02-06 VITALS — Ht 66.0 in | Wt >= 6400 oz

## 2020-02-06 DIAGNOSIS — G8929 Other chronic pain: Secondary | ICD-10-CM

## 2020-02-06 DIAGNOSIS — Z7901 Long term (current) use of anticoagulants: Secondary | ICD-10-CM

## 2020-02-06 DIAGNOSIS — M25562 Pain in left knee: Secondary | ICD-10-CM

## 2020-02-06 DIAGNOSIS — Z6841 Body Mass Index (BMI) 40.0 and over, adult: Secondary | ICD-10-CM

## 2020-02-06 DIAGNOSIS — F17210 Nicotine dependence, cigarettes, uncomplicated: Secondary | ICD-10-CM

## 2020-02-06 NOTE — Progress Notes (Signed)
CC:  I have pain of my left knee. I would like an injection.  The patient has chronic pain of the left knee.  There is no recent trauma.  There is no redness.  Injections in the past have helped.  The knee has no redness, has an effusion and crepitus present.  ROM of the left knee is 0-100, she has a walker.  Impression:  Chronic knee pain left  Encounter Diagnoses  Name Primary?  . Chronic pain of left knee Yes  . Body mass index 60.0-69.9, adult (HCC)   . Morbid obesity (HCC)   . Nicotine dependence, cigarettes, uncomplicated   . Anticoagulated on Coumadin      Return: 1 month  PROCEDURE NOTE:  The patient requests injections of the left knee, verbal consent was obtained.  The left knee was prepped appropriately after time out was performed.   Sterile technique was observed and injection of 1 cc of Depo-Medrol 40 mg with several cc's of plain xylocaine. Anesthesia was provided by ethyl chloride and a 20-gauge needle was used to inject the knee area. The injection was tolerated well.  A band aid dressing was applied.  The patient was advised to apply ice later today and tomorrow to the injection sight as needed.  Electronically Signed Darreld Mclean, MD 7/27/202110:31 AM

## 2020-03-12 ENCOUNTER — Ambulatory Visit: Payer: Medicaid Other | Admitting: Orthopaedic Surgery

## 2020-03-19 ENCOUNTER — Ambulatory Visit: Payer: Medicaid Other | Admitting: Orthopaedic Surgery

## 2020-03-20 ENCOUNTER — Ambulatory Visit: Payer: Medicaid Other | Attending: Neurology | Admitting: Neurology

## 2020-03-20 ENCOUNTER — Other Ambulatory Visit: Payer: Self-pay

## 2020-03-20 DIAGNOSIS — Z79899 Other long term (current) drug therapy: Secondary | ICD-10-CM | POA: Insufficient documentation

## 2020-03-20 DIAGNOSIS — Z7901 Long term (current) use of anticoagulants: Secondary | ICD-10-CM | POA: Insufficient documentation

## 2020-03-20 DIAGNOSIS — G4733 Obstructive sleep apnea (adult) (pediatric): Secondary | ICD-10-CM | POA: Insufficient documentation

## 2020-03-26 ENCOUNTER — Other Ambulatory Visit: Payer: Self-pay

## 2020-03-26 ENCOUNTER — Encounter: Payer: Self-pay | Admitting: Orthopaedic Surgery

## 2020-03-26 ENCOUNTER — Ambulatory Visit (INDEPENDENT_AMBULATORY_CARE_PROVIDER_SITE_OTHER): Payer: Medicaid Other | Admitting: Orthopaedic Surgery

## 2020-03-26 DIAGNOSIS — M25562 Pain in left knee: Secondary | ICD-10-CM | POA: Diagnosis not present

## 2020-03-26 DIAGNOSIS — Z7901 Long term (current) use of anticoagulants: Secondary | ICD-10-CM

## 2020-03-26 DIAGNOSIS — G8929 Other chronic pain: Secondary | ICD-10-CM | POA: Diagnosis not present

## 2020-03-26 DIAGNOSIS — Z6841 Body Mass Index (BMI) 40.0 and over, adult: Secondary | ICD-10-CM

## 2020-03-26 DIAGNOSIS — F17210 Nicotine dependence, cigarettes, uncomplicated: Secondary | ICD-10-CM

## 2020-03-26 NOTE — Patient Instructions (Signed)
Steps to Quit Smoking Smoking tobacco is the leading cause of preventable death. It can affect almost every organ in the body. Smoking puts you and people around you at risk for many serious, long-lasting (chronic) diseases. Quitting smoking can be hard, but it is one of the best things that you can do for your health. It is never too late to quit. How do I get ready to quit? When you decide to quit smoking, make a plan to help you succeed. Before you quit:  Pick a date to quit. Set a date within the next 2 weeks to give you time to prepare.  Write down the reasons why you are quitting. Keep this list in places where you will see it often.  Tell your family, friends, and co-workers that you are quitting. Their support is important.  Talk with your doctor about the choices that may help you quit.  Find out if your health insurance will pay for these treatments.  Know the people, places, things, and activities that make you want to smoke (triggers). Avoid them. What first steps can I take to quit smoking?  Throw away all cigarettes at home, at work, and in your car.  Throw away the things that you use when you smoke, such as ashtrays and lighters.  Clean your car. Make sure to empty the ashtray.  Clean your home, including curtains and carpets. What can I do to help me quit smoking? Talk with your doctor about taking medicines and seeing a counselor at the same time. You are more likely to succeed when you do both.  If you are pregnant or breastfeeding, talk with your doctor about counseling or other ways to quit smoking. Do not take medicine to help you quit smoking unless your doctor tells you to do so. To quit smoking: Quit right away  Quit smoking totally, instead of slowly cutting back on how much you smoke over a period of time.  Go to counseling. You are more likely to quit if you go to counseling sessions regularly. Take medicine You may take medicines to help you quit. Some  medicines need a prescription, and some you can buy over-the-counter. Some medicines may contain a drug called nicotine to replace the nicotine in cigarettes. Medicines may:  Help you to stop having the desire to smoke (cravings).  Help to stop the problems that come when you stop smoking (withdrawal symptoms). Your doctor may ask you to use:  Nicotine patches, gum, or lozenges.  Nicotine inhalers or sprays.  Non-nicotine medicine that is taken by mouth. Find resources Find resources and other ways to help you quit smoking and remain smoke-free after you quit. These resources are most helpful when you use them often. They include:  Online chats with a counselor.  Phone quitlines.  Printed self-help materials.  Support groups or group counseling.  Text messaging programs.  Mobile phone apps. Use apps on your mobile phone or tablet that can help you stick to your quit plan. There are many free apps for mobile phones and tablets as well as websites. Examples include Quit Guide from the CDC and smokefree.gov  What things can I do to make it easier to quit?   Talk to your family and friends. Ask them to support and encourage you.  Call a phone quitline (1-800-QUIT-NOW), reach out to support groups, or work with a counselor.  Ask people who smoke to not smoke around you.  Avoid places that make you want to smoke,   such as: ? Bars. ? Parties. ? Smoke-break areas at work.  Spend time with people who do not smoke.  Lower the stress in your life. Stress can make you want to smoke. Try these things to help your stress: ? Getting regular exercise. ? Doing deep-breathing exercises. ? Doing yoga. ? Meditating. ? Doing a body scan. To do this, close your eyes, focus on one area of your body at a time from head to toe. Notice which parts of your body are tense. Try to relax the muscles in those areas. How will I feel when I quit smoking? Day 1 to 3 weeks Within the first 24 hours,  you may start to have some problems that come from quitting tobacco. These problems are very bad 2-3 days after you quit, but they do not often last for more than 2-3 weeks. You may get these symptoms:  Mood swings.  Feeling restless, nervous, angry, or annoyed.  Trouble concentrating.  Dizziness.  Strong desire for high-sugar foods and nicotine.  Weight gain.  Trouble pooping (constipation).  Feeling like you may vomit (nausea).  Coughing or a sore throat.  Changes in how the medicines that you take for other issues work in your body.  Depression.  Trouble sleeping (insomnia). Week 3 and afterward After the first 2-3 weeks of quitting, you may start to notice more positive results, such as:  Better sense of smell and taste.  Less coughing and sore throat.  Slower heart rate.  Lower blood pressure.  Clearer skin.  Better breathing.  Fewer sick days. Quitting smoking can be hard. Do not give up if you fail the first time. Some people need to try a few times before they succeed. Do your best to stick to your quit plan, and talk with your doctor if you have any questions or concerns. Summary  Smoking tobacco is the leading cause of preventable death. Quitting smoking can be hard, but it is one of the best things that you can do for your health.  When you decide to quit smoking, make a plan to help you succeed.  Quit smoking right away, not slowly over a period of time.  When you start quitting, seek help from your doctor, family, or friends. This information is not intended to replace advice given to you by your health care provider. Make sure you discuss any questions you have with your health care provider. Document Revised: 03/24/2019 Document Reviewed: 09/17/2018 Elsevier Patient Education  2020 Elsevier Inc.  

## 2020-03-26 NOTE — Procedures (Signed)
[facility] A. Gerilyn Pilgrim, MD     [email or web address]             NOCTURNAL POLYSOMNOGRAPHY   LOCATION: [facility]   Patient Name: [name], [name] Date: 03/20/2020 Gender: Female D.O.B: 1976/03/11 Age (years): 71 Referring Provider: Jorge Mandril NP Height (inches): 66 Interpreting Physician: Beryle Beams MD, ABSM Weight (lbs): 407 RPSGT: Peak, Robert BMI: 66 MRN: [id number] Neck Size: 19.50 CLINICAL INFORMATION Sleep Study Type: NPSG     Indication for sleep study: N/A     Epworth Sleepiness Score: 20     SLEEP STUDY TECHNIQUE As per the AASM Manual for the Scoring of Sleep and Associated Events v2.3 (April 2016) with a hypopnea requiring 4% desaturations.  The channels recorded and monitored were frontal, central and occipital EEG, electrooculogram (EOG), submentalis EMG (chin), nasal and oral airflow, thoracic and abdominal wall motion, anterior tibialis EMG, snore microphone, electrocardiogram, and pulse oximetry.  MEDICATIONS Medications self-administered by patient taken the night of the study : N/A  Current Outpatient Medications:  .  albuterol (VENTOLIN HFA) 108 (90 Base) MCG/ACT inhaler, Inhale 2 puffs into the lungs every 4 (four) hours as needed for wheezing or shortness of breath., Disp: , Rfl:  .  atorvastatin (LIPITOR) 20 MG tablet, Take 20 mg by mouth every evening. , Disp: , Rfl:  .  busPIRone (BUSPAR) 10 MG tablet, Take 10 mg by mouth 3 (three) times daily., Disp: , Rfl:  .  fluticasone (FLOVENT HFA) 220 MCG/ACT inhaler, Inhale 1 puff into the lungs 2 (two) times daily., Disp: , Rfl:  .  folic acid (FOLVITE) 1 MG tablet, Take 1 tablet (1 mg total) by mouth daily., Disp: 30 tablet, Rfl: 3 .  gabapentin (NEURONTIN) 400 MG capsule, Take 400 mg by mouth 3 (three) times daily., Disp: , Rfl:  .  hydrochlorothiazide (HYDRODIURIL) 25 MG tablet, Take 25 mg by mouth daily., Disp: , Rfl:  .  lamoTRIgine (LAMICTAL) 100 MG tablet,  Take 100 mg by mouth 2 (two) times daily., Disp: , Rfl:  .  lubiprostone (AMITIZA) 24 MCG capsule, Take 1 capsule (24 mcg total) by mouth 2 (two) times daily with a meal., Disp: 60 capsule, Rfl: 3 .  methocarbamol (ROBAXIN) 750 MG tablet, Take 750 mg by mouth 4 (four) times daily as needed for muscle spasms. , Disp: , Rfl:  .  omeprazole (PRILOSEC) 40 MG capsule, Take 40 mg by mouth 2 (two) times daily., Disp: , Rfl:  .  ondansetron (ZOFRAN) 4 MG tablet, Take 4 mg by mouth 3 (three) times daily as needed for nausea or vomiting., Disp: , Rfl:  .  potassium chloride (KLOR-CON) 10 MEQ tablet, Take 2 tablets (20 mEq total) by mouth 2 (two) times daily for 2 days., Disp: 8 tablet, Rfl: 0 .  prazosin (MINIPRESS) 2 MG capsule, Take 2 mg by mouth at bedtime., Disp: , Rfl:  .  Prenatal Vit-Fe Fumarate-FA (PRENATAL COMPLETE PO), Take 1 tablet by mouth daily. , Disp: , Rfl:  .  QUEtiapine (SEROQUEL) 300 MG tablet, Take 300 mg by mouth at bedtime., Disp: , Rfl:  .  sucralfate (CARAFATE) 1 GM/10ML suspension, TAKE 10 MILILITERS BY MOUTH FOUR TIMES DAILY WITH MEALS AND AT BEDTIME, Disp: 420 mL, Rfl: 1 .  topiramate (TOPAMAX) 100 MG tablet, Take 100 mg by mouth daily., Disp: , Rfl:  .  Vitamin D, Ergocalciferol, (DRISDOL) 1.25 MG (50000 UT) CAPS capsule, Take 50,000 Units by mouth every 7 (seven) days., Disp: , Rfl:  SLEEP ARCHITECTURE The study was initiated at 9:58:53 PM and ended at 4:25:37 AM.  Sleep onset time was 12.2 minutes and the sleep efficiency was 66.3%%. The total sleep time was 256.5 minutes.  Stage REM latency was N/A minutes.  The patient spent 3.9%% of the night in stage N1 sleep, 94.5%% in stage N2 sleep, 1.6%% in stage N3 and 0% in REM.  Alpha intrusion was absent.  Supine sleep was 0.00%.  RESPIRATORY PARAMETERS The overall apnea/hypopnea index (AHI) was 22.5 per hour. There were 4 total apneas, including 2 obstructive, 2 central and 0 mixed apneas. There were 92 hypopneas  and 5 RERAs.  The AHI during Stage REM sleep was N/A per hour.  AHI while supine was N/A per hour.  The mean oxygen saturation was 87.7%. The minimum SpO2 during sleep was 80.0%.  loud snoring was noted during this study.  CARDIAC DATA The 2 lead EKG demonstrated sinus rhythm. The mean heart rate was 87.0 beats per minute. Other EKG findings include: None.  LEG MOVEMENT DATA The total PLMS were 0 with a resulting PLMS index of 0.0. Associated arousal with leg movement index was 0.0.  IMPRESSIONS 1.  Moderate obstructive sleep apnea syndrome is documented with this study.  AutoPap 10-20 is recommended. 2.  Abnormal sleep architecture is also noted with markedly reduced slow-wave sleep and absent REM sleep.    Argie Ramming, MD Diplomate, American Board of Sleep Medicine.  ELECTRONICALLY SIGNED ON:  03/26/2020, 4:45 PM [facility] PH: [phone]   FX: [phone] ACCREDITED BY THE AMERICAN ACADEMY OF SLEEP MEDICINE

## 2020-03-26 NOTE — Progress Notes (Signed)
PROCEDURE NOTE:  The patient requests injections of the left knee , verbal consent was obtained.  The left knee was prepped appropriately after time out was performed.   Sterile technique was observed and injection of 1 cc of Depo-Medrol 40 mg with several cc's of plain xylocaine. Anesthesia was provided by ethyl chloride and a 20-gauge needle was used to inject the knee area. The injection was tolerated well.  A band aid dressing was applied.  The patient was advised to apply ice later today and tomorrow to the injection sight as needed.  Encounter Diagnoses  Name Primary?  . Chronic pain of left knee Yes  . Body mass index 60.0-69.9, adult (HCC)   . Morbid obesity (HCC)   . Anticoagulated on Coumadin   . Nicotine dependence, cigarettes, uncomplicated    Return in one month.  Electronically Signed Darreld Mclean, MD 9/14/202110:17 AM

## 2020-04-23 ENCOUNTER — Ambulatory Visit: Payer: Medicaid Other | Admitting: Orthopaedic Surgery

## 2020-04-30 ENCOUNTER — Encounter: Payer: Self-pay | Admitting: Orthopaedic Surgery

## 2020-04-30 ENCOUNTER — Other Ambulatory Visit: Payer: Self-pay

## 2020-04-30 ENCOUNTER — Ambulatory Visit (INDEPENDENT_AMBULATORY_CARE_PROVIDER_SITE_OTHER): Payer: Medicaid Other | Admitting: Orthopaedic Surgery

## 2020-04-30 VITALS — Ht 66.0 in | Wt >= 6400 oz

## 2020-04-30 DIAGNOSIS — G8929 Other chronic pain: Secondary | ICD-10-CM | POA: Diagnosis not present

## 2020-04-30 DIAGNOSIS — M25562 Pain in left knee: Secondary | ICD-10-CM

## 2020-04-30 DIAGNOSIS — F17210 Nicotine dependence, cigarettes, uncomplicated: Secondary | ICD-10-CM

## 2020-04-30 DIAGNOSIS — Z6841 Body Mass Index (BMI) 40.0 and over, adult: Secondary | ICD-10-CM

## 2020-04-30 DIAGNOSIS — Z7901 Long term (current) use of anticoagulants: Secondary | ICD-10-CM

## 2020-04-30 NOTE — Progress Notes (Signed)
PROCEDURE NOTE:  The patient requests injections of the left knee , verbal consent was obtained.  The left knee was prepped appropriately after time out was performed.   Sterile technique was observed and injection of 1 cc of Depo-Medrol 40 mg with several cc's of plain xylocaine. Anesthesia was provided by ethyl chloride and a 20-gauge needle was used to inject the knee area. The injection was tolerated well.  A band aid dressing was applied.  The patient was advised to apply ice later today and tomorrow to the injection sight as needed.  Return in one month.  Electronically Signed Darreld Mclean, MD 10/19/20218:17 AM

## 2020-05-28 ENCOUNTER — Ambulatory Visit (INDEPENDENT_AMBULATORY_CARE_PROVIDER_SITE_OTHER): Payer: Medicaid Other | Admitting: Orthopaedic Surgery

## 2020-05-28 ENCOUNTER — Encounter: Payer: Self-pay | Admitting: Orthopaedic Surgery

## 2020-05-28 ENCOUNTER — Other Ambulatory Visit: Payer: Self-pay

## 2020-05-28 DIAGNOSIS — M25562 Pain in left knee: Secondary | ICD-10-CM

## 2020-05-28 DIAGNOSIS — G8929 Other chronic pain: Secondary | ICD-10-CM | POA: Diagnosis not present

## 2020-05-28 DIAGNOSIS — Z7901 Long term (current) use of anticoagulants: Secondary | ICD-10-CM

## 2020-05-28 DIAGNOSIS — F17210 Nicotine dependence, cigarettes, uncomplicated: Secondary | ICD-10-CM

## 2020-05-28 DIAGNOSIS — Z6841 Body Mass Index (BMI) 40.0 and over, adult: Secondary | ICD-10-CM

## 2020-05-28 NOTE — Progress Notes (Signed)
PROCEDURE NOTE:  The patient requests injections of the left knee , verbal consent was obtained.  The left knee was prepped appropriately after time out was performed.   Sterile technique was observed and injection of 1 cc of Depo-Medrol 40 mg with several cc's of plain xylocaine. Anesthesia was provided by ethyl chloride and a 20-gauge needle was used to inject the knee area. The injection was tolerated well.  A band aid dressing was applied.  The patient was advised to apply ice later today and tomorrow to the injection sight as needed.  Return in one month.  Electronically Signed Darreld Mclean, MD 11/16/20218:43 AM

## 2020-06-25 ENCOUNTER — Ambulatory Visit: Payer: Medicaid Other | Admitting: Orthopaedic Surgery

## 2020-07-23 ENCOUNTER — Encounter: Payer: Self-pay | Admitting: Orthopaedic Surgery

## 2020-07-23 ENCOUNTER — Ambulatory Visit: Payer: Medicaid Other | Admitting: Orthopaedic Surgery

## 2020-10-11 ENCOUNTER — Other Ambulatory Visit: Payer: Self-pay

## 2020-10-11 ENCOUNTER — Encounter: Payer: Self-pay | Admitting: Emergency Medicine

## 2020-10-11 ENCOUNTER — Ambulatory Visit
Admission: EM | Admit: 2020-10-11 | Discharge: 2020-10-11 | Disposition: A | Discharge status: Home / Self Care | Payer: Medicaid Other | Attending: Emergency Medicine | Admitting: Emergency Medicine

## 2020-10-11 ENCOUNTER — Ambulatory Visit (INDEPENDENT_AMBULATORY_CARE_PROVIDER_SITE_OTHER): Payer: Medicaid Other

## 2020-10-11 DIAGNOSIS — R059 Cough, unspecified: Secondary | ICD-10-CM | POA: Diagnosis not present

## 2020-10-11 DIAGNOSIS — J42 Unspecified chronic bronchitis: Secondary | ICD-10-CM

## 2020-10-11 DIAGNOSIS — R0602 Shortness of breath: Secondary | ICD-10-CM | POA: Diagnosis not present

## 2020-10-11 DIAGNOSIS — R053 Chronic cough: Secondary | ICD-10-CM | POA: Diagnosis not present

## 2020-10-11 MED ORDER — PREDNISONE 10 MG (21) PO TBPK: ORAL_TABLET | Freq: Every day | ORAL | 0 refills | Status: DC

## 2020-10-11 MED ORDER — AZITHROMYCIN 250 MG PO TABS: 250.0000 mg | ORAL_TABLET | Freq: Every day | ORAL | 0 refills | Status: DC

## 2020-10-11 MED ORDER — DEXAMETHASONE SODIUM PHOSPHATE 10 MG/ML IJ SOLN
10.0000 mg | Freq: Once | INTRAMUSCULAR | Status: AC
  Administered 2020-10-11: 10 mg via INTRAMUSCULAR

## 2020-10-11 MED ORDER — ALBUTEROL SULFATE HFA 108 (90 BASE) MCG/ACT IN AERS
2.0000 | INHALATION_SPRAY | Freq: Once | RESPIRATORY_TRACT | Status: AC
  Administered 2020-10-11: 2 via RESPIRATORY_TRACT

## 2020-10-11 NOTE — Discharge Instructions (Signed)
X-ray negative for cardiopulmonary disease Inhaler given in office for shortness of breath or wheezing Get plenty of rest and push fluids Steroid shot given in office Prednisone prescribed.  Take as directed and to completion Azithromycin prescribed.  Take as directed and to completion Follow up with PCP for recheck and/or if symptoms persists Return or go to ER if you have any new or worsening symptoms such as fever, chills, fatigue, shortness of breath, wheezing, chest pain, nausea, changes in bowel or bladder habits, etc..Marland Kitchen

## 2020-10-11 NOTE — ED Provider Notes (Signed)
[facility]   [id number] 10/11/20 Arrival Time: 0809  Cc: COUGH  SUBJECTIVE:  [name] is a 45 y.o. female who presents with SOB, non-productive cough, and wheezing x 1 month.  Gotten progressively worse with season change.  Has tried OTC medications and inhaler without relief.  Denies aggravating factors.  Denies previous symptoms in the past.   Denies fever, chills, chest pain, nausea, changes in bowel or bladder habits.    Admits to smoking half a pack of cigarettes/ day  Negative covid test at home.    ROS: As per HPI.  All other pertinent ROS negative.     Past Medical History:  Diagnosis Date  . Asthma   . Bipolar disorder (HCC)   . Concussion   . DDD (degenerative disc disease), lumbar   . Diabetes mellitus without complication (HCC)   . Dyslipidemia (high LDL; low HDL) 03/27/2016  . Dyspnea    with exertion  . Elevated serum creatinine 03/27/2016  . Fibromyalgia   . GERD (gastroesophageal reflux disease)   . Headache(784.0)   . Hypertension   . Impaired exercise tolerance   . Mental disorder    anxiety  . Morbid obesity with body mass index (BMI) of 60.0 to 69.9 in adult [facility])   . Other sleep apnea    never been diagnosed but states she wakes gasping for air  . Pneumonia 2015  . Spondylosis   . Vitamin D deficiency 03/27/2016   Past Surgical History:  Procedure Laterality Date  . CYST REMOVAL HAND Left   . ESOPHAGEAL DILATION    . ESOPHAGOGASTRODUODENOSCOPY  2006   Dr. Rehman:small hh, esophageal dilation.  . ESOPHAGOGASTRODUODENOSCOPY (EGD) WITH PROPOFOL N/A 05/15/2019   Dr. Lyna Poser: Esophagitis with circumferential erosions or excoriations involving 10 cm segment of mid esophagus with overlying exudate.  Most proximal and distal esophagus appeared normal.  Biopsies revealed inflammation only.  Esophagus dilated.  Marland Kitchen FOOT SURGERY Right    'knot removed"  . MALONEY DILATION N/A 05/15/2019   Procedure: Elease Hashimoto DILATION;  Surgeon: Corbin Ade, MD;   Location: [facility];  Service: Endoscopy;  Laterality: N/A;  . MULTIPLE EXTRACTIONS WITH ALVEOLOPLASTY N/A 05/29/2016   Procedure: MULTIPLE EXTRACTIONS;  Surgeon: Ocie Doyne, DDS;  Location: [facility] OR;  Service: Oral Surgery;  Laterality: N/A;  . WISDOM TOOTH EXTRACTION     Allergies  Allergen Reactions  . Penicillins Rash     Has patient had a PCN reaction causing immediate rash, facial/tongue/throat swelling, SOB or lightheadedness with hypotension:  # # YES # #  Has patient had a PCN reaction causing severe rash involving mucus membranes or skin necrosis:  # # YES # #  Has patient had a PCN reaction that required hospitalization   .No Has patient had a PCN reaction occurring within the last 10 years: No If all of the above answers are "NO", then may proceed with Cephalosporin use.   . Sulfa Antibiotics Anaphylaxis, Hives and Swelling    Swelling in hands/legs DIFFICULTY SWALLOWING  . Latex Other (See Comments)    Red at site of latex bandage  . Nisoldipine   . Other Other (See Comments)    Pt says pain meds make her nauseous    No current facility-administered medications on file prior to encounter.   Current Outpatient Medications on File Prior to Encounter  Medication Sig Dispense Refill  . albuterol (VENTOLIN HFA) 108 (90 Base) MCG/ACT inhaler Inhale 2 puffs into the lungs every 4 (four)  hours as needed for wheezing or shortness of breath.    Marland Kitchen atorvastatin (LIPITOR) 20 MG tablet Take 20 mg by mouth every evening.     . busPIRone (BUSPAR) 10 MG tablet Take 10 mg by mouth 3 (three) times daily.    . fluticasone (FLOVENT HFA) 220 MCG/ACT inhaler Inhale 1 puff into the lungs 2 (two) times daily.    . folic acid (FOLVITE) 1 MG tablet Take 1 tablet (1 mg total) by mouth daily. 30 tablet 3  . gabapentin (NEURONTIN) 400 MG capsule Take 400 mg by mouth 3 (three) times daily.    . hydrochlorothiazide (HYDRODIURIL) 25 MG tablet Take 25 mg by mouth daily.    Marland Kitchen lamoTRIgine (LAMICTAL)  100 MG tablet Take 100 mg by mouth 2 (two) times daily.    Marland Kitchen lubiprostone (AMITIZA) 24 MCG capsule Take 1 capsule (24 mcg total) by mouth 2 (two) times daily with a meal. 60 capsule 3  . methocarbamol (ROBAXIN) 750 MG tablet Take 750 mg by mouth 4 (four) times daily as needed for muscle spasms.     Marland Kitchen omeprazole (PRILOSEC) 40 MG capsule Take 40 mg by mouth 2 (two) times daily.    . ondansetron (ZOFRAN) 4 MG tablet Take 4 mg by mouth 3 (three) times daily as needed for nausea or vomiting.    . potassium chloride (KLOR-CON) 10 MEQ tablet Take 2 tablets (20 mEq total) by mouth 2 (two) times daily for 2 days. 8 tablet 0  . prazosin (MINIPRESS) 2 MG capsule Take 2 mg by mouth at bedtime.    . Prenatal Vit-Fe Fumarate-FA (PRENATAL COMPLETE PO) Take 1 tablet by mouth daily.     . QUEtiapine (SEROQUEL) 300 MG tablet Take 300 mg by mouth at bedtime.    . sucralfate (CARAFATE) 1 GM/10ML suspension TAKE 10 MILILITERS BY MOUTH FOUR TIMES DAILY WITH MEALS AND AT BEDTIME 420 mL 1  . topiramate (TOPAMAX) 100 MG tablet Take 100 mg by mouth daily.    . Vitamin D, Ergocalciferol, (DRISDOL) 1.25 MG (50000 UT) CAPS capsule Take 50,000 Units by mouth every 7 (seven) days.      Social History   Socioeconomic History  . Marital status: Single    Spouse name: Not on file  . Number of children: Not on file  . Years of education: Not on file  . Highest education level: Not on file  Occupational History  . Not on file  Tobacco Use  . Smoking status: Current Every Day Smoker    Packs/day: 1.00    Years: 10.00    Pack years: 10.00    Types: Cigarettes  . Smokeless tobacco: Never Used  Vaping Use  . Vaping Use: Never used  Substance and Sexual Activity  . Alcohol use: Not Currently  . Drug use: No  . Sexual activity: Not Currently    Partners: Male    Birth control/protection: None, Abstinence  Other Topics Concern  . Not on file  Social History Narrative  . Not on file   Social Determinants of Health    Financial Resource Strain: Not on file  Food Insecurity: Not on file  Transportation Needs: Not on file  Physical Activity: Not on file  Stress: Not on file  Social Connections: Not on file  Intimate Partner Violence: Not on file   Family History  Problem Relation Age of Onset  . Cancer Maternal Grandmother        lung, back  . Diabetes Paternal Grandmother   .  Heart disease Paternal Grandmother   . Diabetes Paternal Grandfather   . Heart disease Paternal Grandfather   . Stroke Paternal Grandfather      OBJECTIVE:  Vitals:   10/11/20 0823  BP: 131/83  Pulse: 93  Resp: 20  Temp: 98.5 F (36.9 C)  TempSrc: Oral  SpO2: 95%    General appearance: Alert, appears mildly fatigued, but nontoxic; speaking in full sentences without difficulty HEENT:NCAT; Ears: EACs clear, TMs pearly gray; Eyes: PERRL.  EOM grossly intact. Nose: nares patent without rhinorrhea; Throat: tonsils nonerythematous or enlarged, uvula midline  Neck: supple without LAD Lungs: clear to auscultation bilaterally without adventitious breath sounds, difficult to assess lung sounds due to body habitus; normal respiratory effort; mild cough present Heart: regular rate and rhythm.   Skin: warm and dry Psychological: alert and cooperative; normal mood and affect  DIAGNOSTIC STUDIES:  DG Chest 2 View  Result Date: 10/11/2020 CLINICAL DATA:  Cough, shortness of breath. EXAM: CHEST - 2 VIEW COMPARISON:  November 01, 2018. FINDINGS: The heart size and mediastinal contours are within normal limits. Both lungs are clear. No pneumothorax or pleural effusion is noted. The visualized skeletal structures are unremarkable. IMPRESSION: No active cardiopulmonary disease. Electronically Signed   By: Lupita RaiderJames  Green Jr M.D.   On: 10/11/2020 08:45    I have reviewed the x-rays myself and the radiologist interpretation. I am in agreement with the radiologist interpretation.     ASSESSMENT & PLAN:  1. Chronic cough   2. Chronic  bronchitis, unspecified chronic bronchitis type (HCC)     Meds ordered this encounter  Medications  . predniSONE (STERAPRED UNI-PAK 21 TAB) 10 MG (21) TBPK tablet    Sig: Take by mouth daily. Take 6 tabs by mouth daily  for 2 days, then 5 tabs for 2 days, then 4 tabs for 2 days, then 3 tabs for 2 days, 2 tabs for 2 days, then 1 tab by mouth daily for 2 days    Dispense:  42 tablet    Refill:  0    Order Specific Question:   Supervising Provider    Answer:   Eustace MooreNELSON, YVONNE SUE [[id number]]  . azithromycin (ZITHROMAX) 250 MG tablet    Sig: Take 1 tablet (250 mg total) by mouth daily. Take first 2 tablets together, then 1 every day until finished.    Dispense:  6 tablet    Refill:  0    Order Specific Question:   Supervising Provider    Answer:   Eustace MooreNELSON, YVONNE SUE [[id number]]  . dexamethasone (DECADRON) injection 10 mg  . albuterol (VENTOLIN HFA) 108 (90 Base) MCG/ACT inhaler 2 puff    Orders Placed This Encounter  Procedures  . DG Chest 2 View    Standing Status:   Standing    Number of Occurrences:   1    Order Specific Question:   Reason for Exam (SYMPTOM  OR DIAGNOSIS REQUIRED)    Answer:   cough    X-ray negative for cardiopulmonary disease Inhaler given in office for shortness of breath or wheezing Get plenty of rest and push fluids Steroid shot given in office Prednisone prescribed.  Take as directed and to completion Azithromycin prescribed.  Take as directed and to completion Follow up with PCP for recheck and/or if symptoms persists Return or go to ER if you have any new or worsening symptoms such as fever, chills, fatigue, shortness of breath, wheezing, chest pain, nausea, changes in bowel or bladder habits, etc...Marland Kitchen  Reviewed expectations re: course of current medical issues. Questions answered. Outlined signs and symptoms indicating need for more acute intervention. Patient verbalized understanding. After Visit Summary given.          Rennis Harding,  PA-C 10/11/20 [phone]

## 2020-10-11 NOTE — ED Triage Notes (Addendum)
Shortness of breath, productive cough, coughing up sticking mucous that makes her gag. S/s started x 1 month ago and continue to get worse.  Neg at home covid test yesterday

## 2021-01-07 ENCOUNTER — Other Ambulatory Visit (HOSPITAL_COMMUNITY): Payer: Self-pay | Admitting: Nurse Practitioner

## 2021-01-07 DIAGNOSIS — M545 Low back pain, unspecified: Secondary | ICD-10-CM

## 2021-01-07 DIAGNOSIS — M546 Pain in thoracic spine: Secondary | ICD-10-CM

## 2021-05-18 IMAGING — MR MR KNEE*L* W/O CM
4 of 6 series · 23 of 40 positions shown · non-contrast
Comparison: Radiographs dated 02/24/2019

CLINICAL DATA: Instability of the left knee with locking, catching,
snapping, and crepitus. Medial left knee pain.

EXAM:
MRI OF THE LEFT KNEE WITHOUT CONTRAST
TECHNIQUE: Multiplanar, multisequence MR imaging of the knee was performed. No
intravenous contrast was administered.

[Series 4: T2 fat-sat · coronal · 4.0mm · 0.66mm/px · 6 of 31 slices shown (1 of 2)]
[im 1/31]
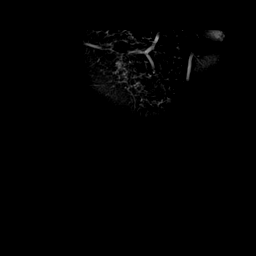
[im 7/31]
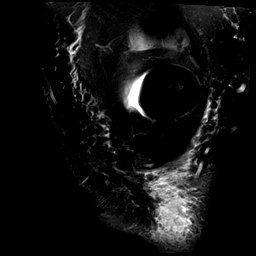
[im 13/31]
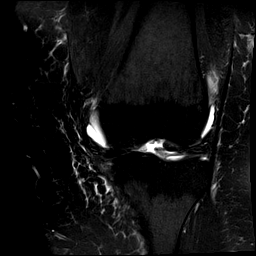
[im 19/31]
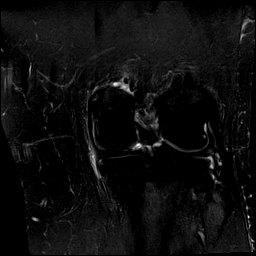
[im 25/31]
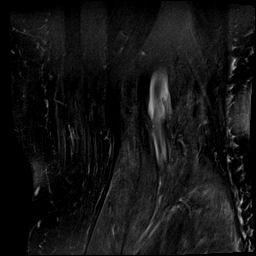
[im 31/31]
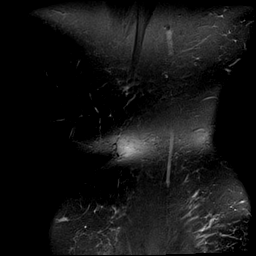

[Series 5: T1 · coronal · 4.0mm · 0.33mm/px · 3 of 31 slices shown]
[im 6/31]
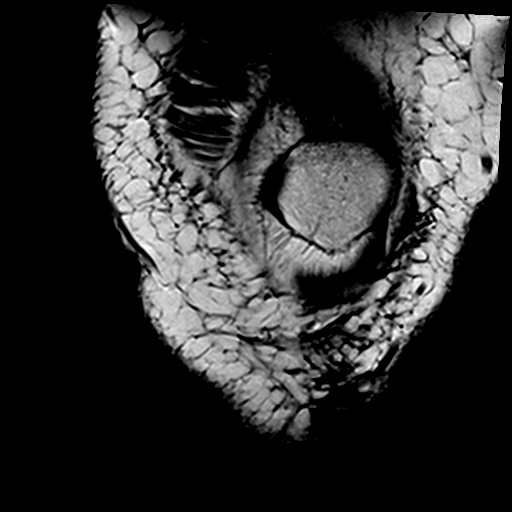
[im 16/31]
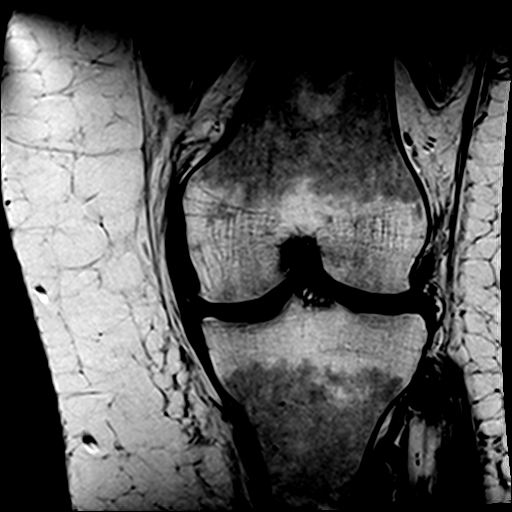
[im 26/31]
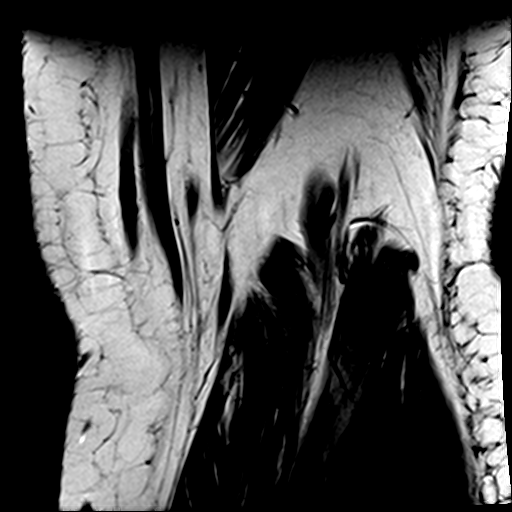

[Series 7: PD fat-sat · sagittal · 3.0mm · 0.33mm/px · 7 of 30 slices shown]
[im 1/30]
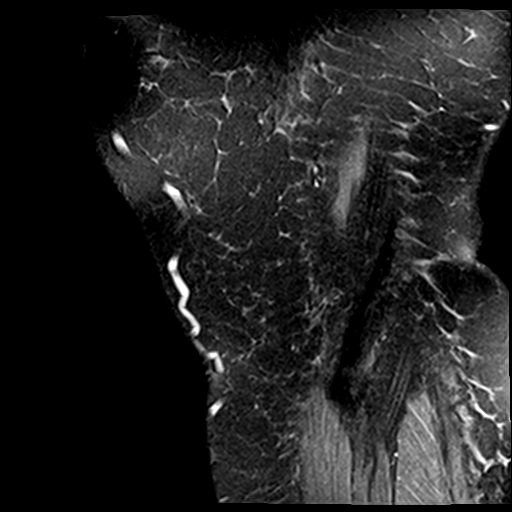
[im 5/30]
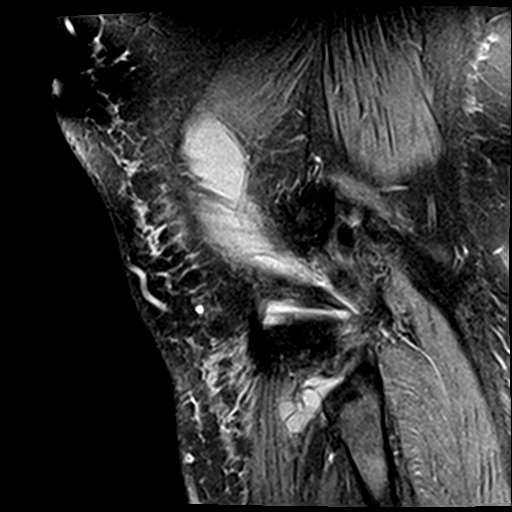
[im 10/30]
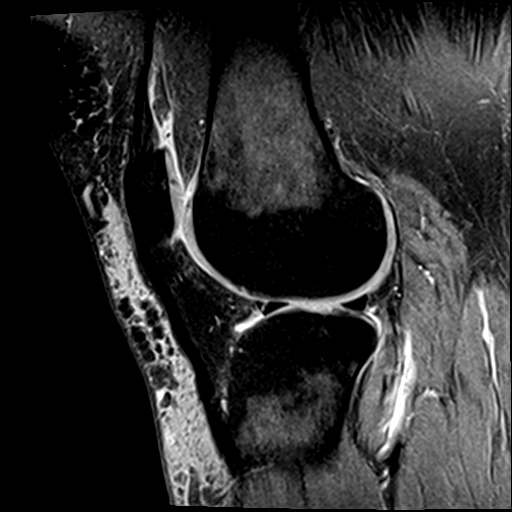
[im 15/30]
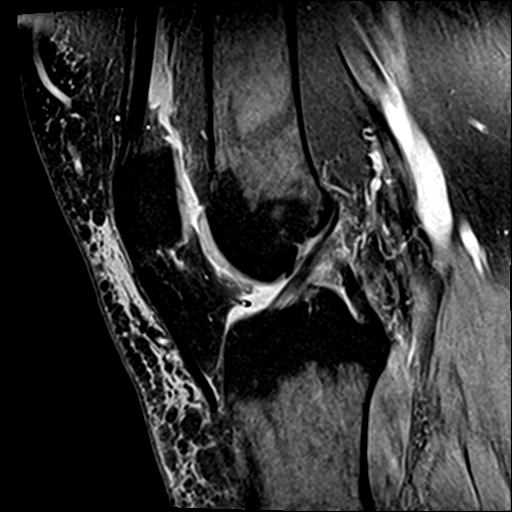
[im 20/30]
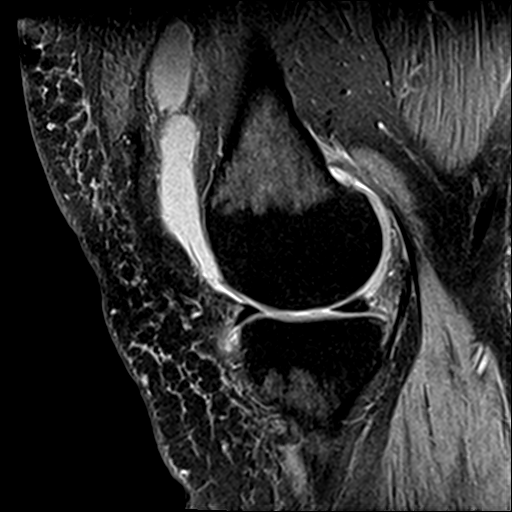
[im 25/30]
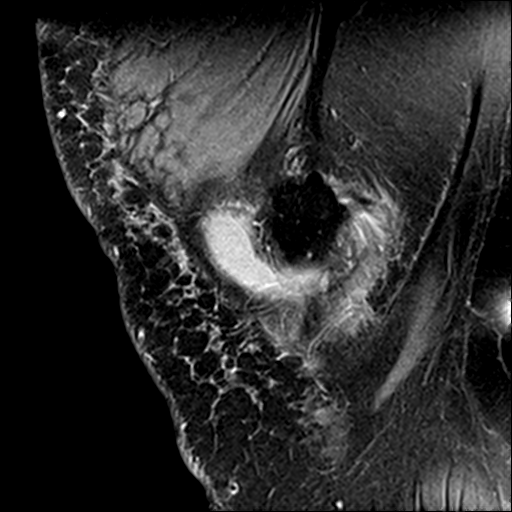
[im 30/30]
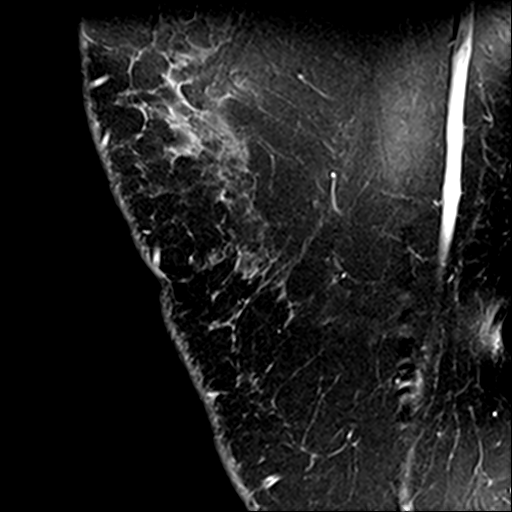

[Series 8: T2 fat-sat · sagittal · 3.0mm · 0.33mm/px · 7 of 30 slices shown (2 of 2)]
[im 1/30]
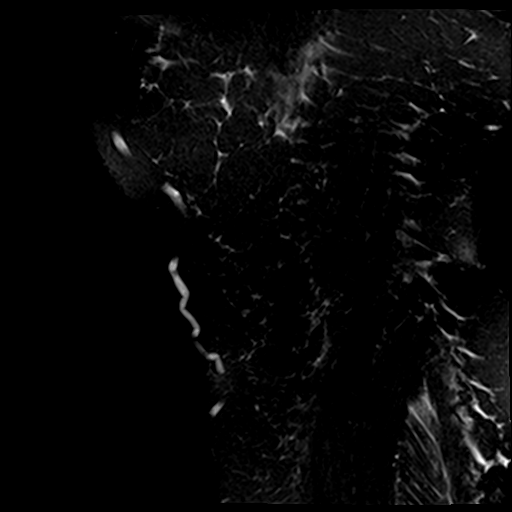
[im 5/30]
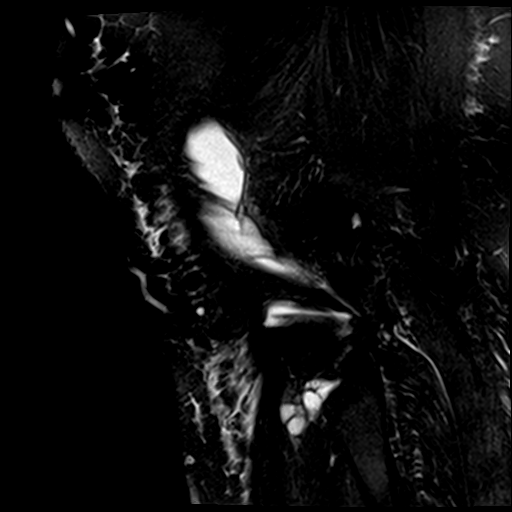
[im 10/30]
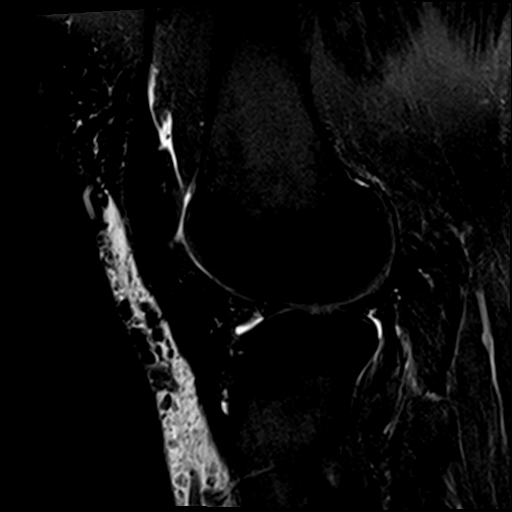
[im 15/30]
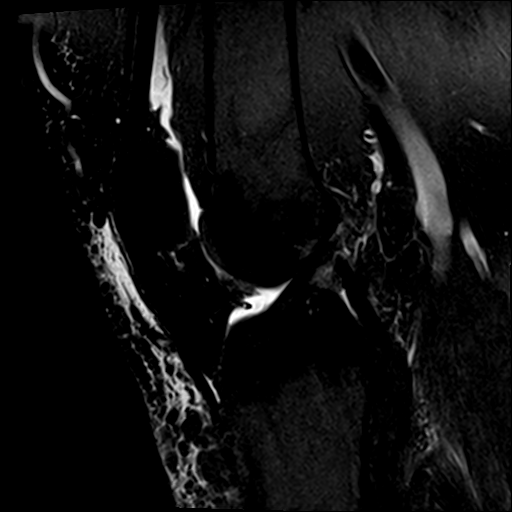
[im 20/30]
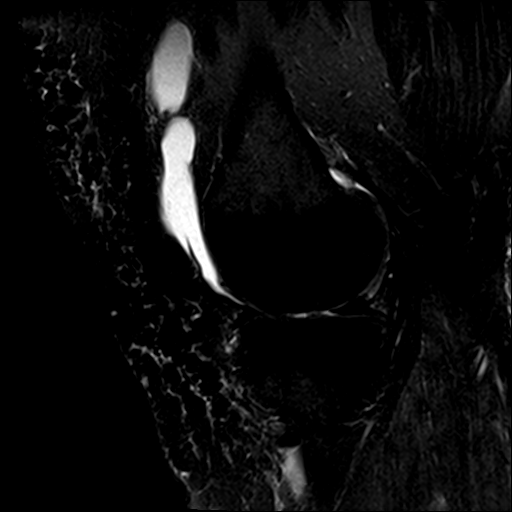
[im 25/30]
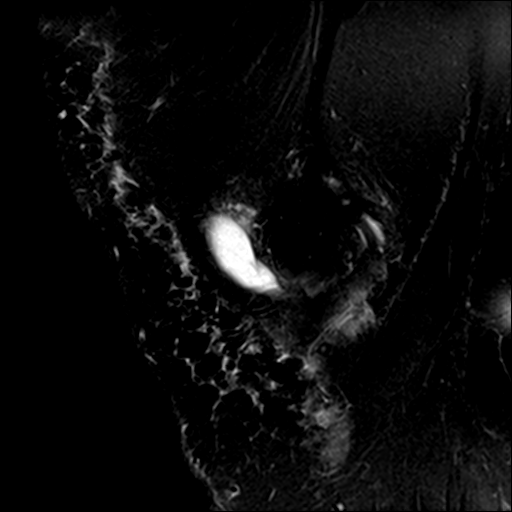
[im 30/30]
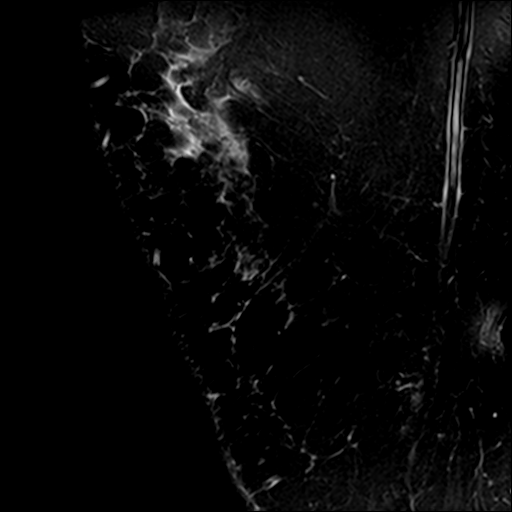

[23 of 40 positions shown; findings below may reference images not displayed]

FINDINGS: MENISCI

Medial meniscus:  Normal.

Lateral meniscus:  Normal.

LIGAMENTS

Cruciates:  Normal.

Collaterals:  Normal.

CARTILAGE

Patellofemoral: Fraying of the articular cartilage of the lateral
facet of the patella.

Medial: Irregular thinning of the articular cartilage of the medial
compartment.

Lateral: Tiny focal areas of cartilage degeneration of the central
portion of the tibial plateau.

Joint: Moderate joint effusion. Thickened suprapatellar plica.
Normal Hoffa's fat pad.

Popliteal Fossa:  No Baker cyst. Intact popliteus tendon.

Extensor Mechanism:  Normal.

Bones:  Normal.

Other: Prominent subcutaneous edema anterior to the patellar tendon
extending into the anterior aspect of the proximal lower leg,
nonspecific. This is not felt to represent prepatellar bursitis.
IMPRESSION: 1. Moderate joint effusion with thickened suprapatellar plica.
2. Slight chondromalacia of the patellofemoral and medial
compartments.

## 2021-06-02 ENCOUNTER — Emergency Department (HOSPITAL_COMMUNITY)
Admission: EM | Admit: 2021-06-02 | Discharge: 2021-06-02 | Disposition: A | Discharge status: Home / Self Care | Payer: Medicaid Other | Attending: Emergency Medicine | Admitting: Emergency Medicine

## 2021-06-02 ENCOUNTER — Emergency Department (HOSPITAL_COMMUNITY): Payer: Medicaid Other

## 2021-06-02 ENCOUNTER — Encounter (HOSPITAL_COMMUNITY): Payer: Self-pay | Admitting: *Deleted

## 2021-06-02 ENCOUNTER — Other Ambulatory Visit: Payer: Self-pay

## 2021-06-02 DIAGNOSIS — E119 Type 2 diabetes mellitus without complications: Secondary | ICD-10-CM | POA: Insufficient documentation

## 2021-06-02 DIAGNOSIS — S00531A Contusion of lip, initial encounter: Secondary | ICD-10-CM | POA: Insufficient documentation

## 2021-06-02 DIAGNOSIS — S0083XA Contusion of other part of head, initial encounter: Secondary | ICD-10-CM | POA: Insufficient documentation

## 2021-06-02 DIAGNOSIS — S1093XA Contusion of unspecified part of neck, initial encounter: Secondary | ICD-10-CM | POA: Diagnosis not present

## 2021-06-02 DIAGNOSIS — Z79899 Other long term (current) drug therapy: Secondary | ICD-10-CM | POA: Diagnosis not present

## 2021-06-02 DIAGNOSIS — Z7951 Long term (current) use of inhaled steroids: Secondary | ICD-10-CM | POA: Insufficient documentation

## 2021-06-02 DIAGNOSIS — F17210 Nicotine dependence, cigarettes, uncomplicated: Secondary | ICD-10-CM | POA: Diagnosis not present

## 2021-06-02 DIAGNOSIS — Z91040 Latex allergy status: Secondary | ICD-10-CM | POA: Insufficient documentation

## 2021-06-02 DIAGNOSIS — R52 Pain, unspecified: Secondary | ICD-10-CM

## 2021-06-02 DIAGNOSIS — J45909 Unspecified asthma, uncomplicated: Secondary | ICD-10-CM | POA: Diagnosis not present

## 2021-06-02 DIAGNOSIS — S0993XA Unspecified injury of face, initial encounter: Secondary | ICD-10-CM | POA: Diagnosis present

## 2021-06-02 DIAGNOSIS — S2020XA Contusion of thorax, unspecified, initial encounter: Secondary | ICD-10-CM | POA: Diagnosis not present

## 2021-06-02 DIAGNOSIS — I10 Essential (primary) hypertension: Secondary | ICD-10-CM | POA: Diagnosis not present

## 2021-06-02 HISTORY — DX: Polyneuropathy, unspecified: G62.9

## 2021-06-02 LAB — HCG, SERUM, QUALITATIVE: Preg, Serum: NEGATIVE

## 2021-06-02 LAB — CBC
HCT: 43.6 % (ref 36.0–46.0)
Hemoglobin: 14.8 g/dL (ref 12.0–15.0)
MCH: 35.6 pg — ABNORMAL HIGH (ref 26.0–34.0)
MCHC: 33.9 g/dL (ref 30.0–36.0)
MCV: 104.8 fL — ABNORMAL HIGH (ref 80.0–100.0)
Platelets: 274 10*3/uL (ref 150–400)
RBC: 4.16 MIL/uL (ref 3.87–5.11)
RDW: 16.5 % — ABNORMAL HIGH (ref 11.5–15.5)
WBC: 9.4 10*3/uL (ref 4.0–10.5)
nRBC: 0 % (ref 0.0–0.2)

## 2021-06-02 LAB — BASIC METABOLIC PANEL
Anion gap: 10 (ref 5–15)
BUN: 6 mg/dL (ref 6–20)
CO2: 24 mmol/L (ref 22–32)
Calcium: 9.1 mg/dL (ref 8.9–10.3)
Chloride: 103 mmol/L (ref 98–111)
Creatinine, Ser: 1.12 mg/dL — ABNORMAL HIGH (ref 0.44–1.00)
GFR, Estimated: >60 mL/min (ref >60)
Glucose, Bld: 189 mg/dL — ABNORMAL HIGH (ref 70–99)
Potassium: 3.5 mmol/L (ref 3.5–5.1)
Sodium: 137 mmol/L (ref 135–145)

## 2021-06-02 LAB — TROPONIN I (HIGH SENSITIVITY)
Troponin I (High Sensitivity): 4 ng/L (ref <18)
Troponin I (High Sensitivity): 4 ng/L (ref <18)

## 2021-06-02 LAB — CBG MONITORING, ED: Glucose-Capillary: 218 mg/dL — ABNORMAL HIGH (ref 70–99)

## 2021-06-02 MED ORDER — HYDROCODONE-ACETAMINOPHEN 5-325 MG PO TABS: 1.0000 | ORAL_TABLET | Freq: Four times a day (QID) | ORAL | 0 refills | Status: DC | PRN

## 2021-06-02 NOTE — ED Notes (Signed)
Edp in room to assess. Ok to get up and use bed side commode
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
## 2021-06-02 NOTE — ED Notes (Signed)
C-Collar placed. Patient is alert and oriented able to move all extremities.

## 2021-06-02 NOTE — Discharge Instructions (Signed)
Follow-up with a dentist for your tooth.  Follow-up with your family doctor if any problem
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
## 2021-11-13 ENCOUNTER — Other Ambulatory Visit (HOSPITAL_COMMUNITY)
Admission: RE | Admit: 2021-11-13 | Discharge: 2021-11-13 | Disposition: A | Discharge status: Home / Self Care | Payer: Medicaid Other | Source: Ambulatory Visit | Attending: Obstetrics & Gynecology | Admitting: Obstetrics & Gynecology

## 2021-11-13 ENCOUNTER — Encounter: Payer: Self-pay | Admitting: Obstetrics & Gynecology

## 2021-11-13 ENCOUNTER — Ambulatory Visit (INDEPENDENT_AMBULATORY_CARE_PROVIDER_SITE_OTHER): Payer: Medicaid Other | Admitting: Obstetrics & Gynecology

## 2021-11-13 VITALS — BP 136/88 | HR 91

## 2021-11-13 DIAGNOSIS — L732 Hidradenitis suppurativa: Secondary | ICD-10-CM | POA: Diagnosis not present

## 2021-11-13 DIAGNOSIS — Z01419 Encounter for gynecological examination (general) (routine) without abnormal findings: Secondary | ICD-10-CM | POA: Insufficient documentation

## 2021-11-13 DIAGNOSIS — Z0000 Encounter for general adult medical examination without abnormal findings: Secondary | ICD-10-CM

## 2021-11-13 DIAGNOSIS — F17200 Nicotine dependence, unspecified, uncomplicated: Secondary | ICD-10-CM | POA: Diagnosis not present

## 2021-11-13 MED ORDER — SILVER SULFADIAZINE 1 % EX CREA: TOPICAL_CREAM | CUTANEOUS | 11 refills | Status: AC

## 2021-11-13 MED ORDER — MEDROXYPROGESTERONE ACETATE 10 MG PO TABS: 10.0000 mg | ORAL_TABLET | Freq: Every day | ORAL | 11 refills | Status: DC

## 2021-11-13 NOTE — Progress Notes (Signed)
Subjective:     [name] is a 46 y.o. female here for a routine exam.  No LMP recorded. (Menstrual status: Irregular Periods). [id number] Birth Control Method:  none Menstrual Calendar(currently): irregular  Current complaints: HS.   Current acute medical issues:     Recent Gynecologic History No LMP recorded. (Menstrual status: Irregular Periods). Last Pap: 03/2016,  normal Last mammogram: unsure,    Past Medical History:  Diagnosis Date   Asthma    Bipolar disorder (HCC)    Concussion    DDD (degenerative disc disease), lumbar    Diabetes mellitus without complication (HCC)    Dyslipidemia (high LDL; low HDL) 03/27/2016   Dyspnea    with exertion   Elevated serum creatinine 03/27/2016   Fibromyalgia    GERD (gastroesophageal reflux disease)    Headache(784.0)    Hypertension    Impaired exercise tolerance    Mental disorder    anxiety   Morbid obesity with body mass index (BMI) of 60.0 to 69.9 in adult [facility])    Neuropathy    Other sleep apnea    never been diagnosed but states she wakes gasping for air   Pneumonia 2015   Spondylosis    Vitamin D deficiency 03/27/2016    Past Surgical History:  Procedure Laterality Date   CYST REMOVAL HAND Left    ESOPHAGEAL DILATION     ESOPHAGOGASTRODUODENOSCOPY  2006   Dr. Rehman:small hh, esophageal dilation.   ESOPHAGOGASTRODUODENOSCOPY (EGD) WITH PROPOFOL N/A 05/15/2019   Dr. Lyna Poser: Esophagitis with circumferential erosions or excoriations involving 10 cm segment of mid esophagus with overlying exudate.  Most proximal and distal esophagus appeared normal.  Biopsies revealed inflammation only.  Esophagus dilated.   FOOT SURGERY Right    'knot removed"   MALONEY DILATION N/A 05/15/2019   Procedure: MALONEY DILATION;  Surgeon: Corbin Ade, MD;  [facility];  Service: Endoscopy;  Laterality: N/A;   MULTIPLE EXTRACTIONS WITH ALVEOLOPLASTY N/A 05/29/2016   Procedure: MULTIPLE EXTRACTIONS;  Surgeon: Ocie Doyne,  DDS;  Location: [facility] OR;  Service: Oral Surgery;  Laterality: N/A;   WISDOM TOOTH EXTRACTION      OB History     Gravida  3   Para  1   Term  1   Preterm      AB  2   Living  1      SAB  2   IAB      Ectopic      Multiple      Live Births              Social History   Socioeconomic History   Marital status: Single    Spouse name: Not on file   Number of children: Not on file   Years of education: Not on file   Highest education level: Not on file  Occupational History   Not on file  Tobacco Use   Smoking status: Every Day    Packs/day: 1.00    Years: 10.00    Pack years: 10.00    Types: Cigarettes   Smokeless tobacco: Never  Vaping Use   Vaping Use: Never used  Substance and Sexual Activity   Alcohol use: Not Currently   Drug use: No   Sexual activity: Not Currently    Partners: Male    Birth control/protection: None, Abstinence  Other Topics Concern   Not on file  Social History Narrative   Not on file   Social Determinants  of Health   Financial Resource Strain: Low Risk    Difficulty of Paying Living Expenses: Not hard at all  Food Insecurity: No Food Insecurity   Worried About Running Out of Food in the Last Year: Never true   Ran Out of Food in the Last Year: Never true  Transportation Needs: No Transportation Needs   Lack of Transportation (Medical): No   Lack of Transportation (Non-Medical): No  Physical Activity: Inactive   Days of Exercise per Week: 0 days   Minutes of Exercise per Session: 0 min  Stress: No Stress Concern Present   Feeling of Stress : Only a little  Social Connections: Socially Isolated   Frequency of Communication with Friends and Family: Once a week   Frequency of Social Gatherings with Friends and Family: Never   Attends Religious Services: Never   Diplomatic Services operational officer: No   Attends Engineer, structural: Never   Marital Status: Never married    Family History  Problem  Relation Age of Onset   Cancer Maternal Grandmother        lung, back   Diabetes Paternal Grandmother    Heart disease Paternal Grandmother    Diabetes Paternal Grandfather    Heart disease Paternal Grandfather    Stroke Paternal Grandfather      Current Outpatient Medications:    albuterol (VENTOLIN HFA) 108 (90 Base) MCG/ACT inhaler, Inhale 2 puffs into the lungs every 4 (four) hours as needed for wheezing or shortness of breath., Disp: , Rfl:    atorvastatin (LIPITOR) 20 MG tablet, Take 20 mg by mouth every evening. , Disp: , Rfl:    busPIRone (BUSPAR) 10 MG tablet, Take 10 mg by mouth 3 (three) times daily. 20 mg twice a day and 10 mg midday., Disp: , Rfl:    COMBIVENT RESPIMAT 20-100 MCG/ACT AERS respimat, Inhale 1 puff into the lungs 4 (four) times daily., Disp: , Rfl:    fluticasone (FLOVENT HFA) 220 MCG/ACT inhaler, Inhale 1 puff into the lungs 2 (two) times daily., Disp: , Rfl:    furosemide (LASIX) 20 MG tablet, Take 20 mg by mouth daily., Disp: , Rfl:    gabapentin (NEURONTIN) 400 MG capsule, Take 600 mg by mouth 3 (three) times daily., Disp: , Rfl:    hydrochlorothiazide (HYDRODIURIL) 25 MG tablet, Take 25 mg by mouth daily., Disp: , Rfl:    HYDROcodone-acetaminophen (NORCO) 10-325 MG tablet, Take 1 tablet by mouth daily as needed., Disp: , Rfl:    lamoTRIgine (LAMICTAL) 100 MG tablet, Take 100 mg by mouth 2 (two) times daily., Disp: , Rfl:    lubiprostone (AMITIZA) 24 MCG capsule, Take 1 capsule (24 mcg total) by mouth 2 (two) times daily with a meal., Disp: 60 capsule, Rfl: 3   methocarbamol (ROBAXIN) 750 MG tablet, Take 750 mg by mouth 4 (four) times daily as needed for muscle spasms. , Disp: , Rfl:    omeprazole (PRILOSEC) 40 MG capsule, Take 40 mg by mouth 2 (two) times daily., Disp: , Rfl:    ondansetron (ZOFRAN) 4 MG tablet, Take 4 mg by mouth 3 (three) times daily as needed for nausea or vomiting., Disp: , Rfl:    ondansetron (ZOFRAN) 4 MG tablet, Take 4 mg by mouth  every 8 (eight) hours as needed for nausea or vomiting., Disp: , Rfl:    prazosin (MINIPRESS) 2 MG capsule, Take 2 mg by mouth at bedtime., Disp: , Rfl:    Prenatal Vit-Fe Fumarate-FA (PRENATAL  COMPLETE PO), Take 1 tablet by mouth daily. , Disp: , Rfl:    QUEtiapine (SEROQUEL) 300 MG tablet, Take 300 mg by mouth at bedtime., Disp: , Rfl:    sucralfate (CARAFATE) 1 GM/10ML suspension, TAKE 10 MILILITERS BY MOUTH FOUR TIMES DAILY WITH MEALS AND AT BEDTIME, Disp: 420 mL, Rfl: 1   SYMBICORT 160-4.5 MCG/ACT inhaler, Inhale into the lungs., Disp: , Rfl:    topiramate (TOPAMAX) 100 MG tablet, Take 100 mg by mouth daily., Disp: , Rfl:    Vitamin D, Ergocalciferol, (DRISDOL) 1.25 MG (50000 UT) CAPS capsule, Take 50,000 Units by mouth every 7 (seven) days., Disp: , Rfl:   Review of Systems  Review of Systems  Constitutional: Negative for fever, chills, weight loss, malaise/fatigue and diaphoresis.  HENT: Negative for hearing loss, ear pain, nosebleeds, congestion, sore throat, neck pain, tinnitus and ear discharge.   Eyes: Negative for blurred vision, double vision, photophobia, pain, discharge and redness.  Respiratory: Negative for cough, hemoptysis, sputum production, shortness of breath, wheezing and stridor.   Cardiovascular: Negative for chest pain, palpitations, orthopnea, claudication, leg swelling and PND.  Gastrointestinal: negative for abdominal pain. Negative for heartburn, nausea, vomiting, diarrhea, constipation, blood in stool and melena.  Genitourinary: Negative for dysuria, urgency, frequency, hematuria and flank pain.  Musculoskeletal: Negative for myalgias, back pain, joint pain and falls.  Skin: Negative for itching and rash.  Neurological: Negative for dizziness, tingling, tremors, sensory change, speech change, focal weakness, seizures, loss of consciousness, weakness and headaches.  Endo/Heme/Allergies: Negative for environmental allergies and polydipsia. Does not bruise/bleed  easily.  Psychiatric/Behavioral: Negative for depression, suicidal ideas, hallucinations, memory loss and substance abuse. The patient is not nervous/anxious and does not have insomnia.        Objective:  Blood pressure 136/88, pulse 91.   Physical Exam  Vitals reviewed. Constitutional: She is oriented to person, place, and time. She appears well-developed and well-nourished.  HENT:  Head: Normocephalic and atraumatic.        Right Ear: External ear normal.  Left Ear: External ear normal.  Nose: Nose normal.  Mouth/Throat: Oropharynx is clear and moist.  Eyes: Conjunctivae and EOM are normal. Pupils are equal, round, and reactive to light. Right eye exhibits no discharge. Left eye exhibits no discharge. No scleral icterus.  Neck: Normal range of motion. Neck supple. No tracheal deviation present. No thyromegaly present.  Cardiovascular: Normal rate, regular rhythm, normal heart sounds and intact distal pulses.  Exam reveals no gallop and no friction rub.   No murmur heard. Respiratory: Effort normal and breath sounds normal. No respiratory distress. She has no wheezes. She has no rales. She exhibits no tenderness.  GI: Soft. Bowel sounds are normal. She exhibits no distension and no mass. There is no tenderness. There is no rebound and no guarding.  Genitourinary:  Breasts no masses skin changes or nipple changes bilaterally  HS from axilla      Vulva is normal without lesions Vagina is pink moist without discharge Cervix normal in appearance and pap is done Uterus is normal size shape and contour Adnexa is negative with normal sized ovaries   Musculoskeletal: Normal range of motion. She exhibits no edema and no tenderness.  Neurological: She is alert and oriented to person, place, and time. She has normal reflexes. She displays normal reflexes. No cranial nerve deficit. She exhibits normal muscle tone. Coordination normal.  Skin: Skin is warm and dry. No rash noted. No erythema. No  pallor.  Psychiatric: She has  a normal mood and affect. Her behavior is normal. Judgment and thought content normal.       Medications Ordered at today's visit: No orders of the defined types were placed in this encounter.   Other orders placed at today's visit: No orders of the defined types were placed in this encounter.     Assessment:     ICD-10-CM   1. Well woman exam with routine gynecological exam  Z01.419     2. Encounter for gynecological examination with Papanicolaou smear of cervix  Z01.419 Cytology - PAP( [facility])    3. Hidradenitis suppurativa: axilla, under breasts, vulva  L73.2    chronic long standing-->smoking cessation, silvadene cream    4. Smoker: 1ppd  F17.200         Plan:    As above     No follow-ups on file.

## 2021-11-17 LAB — CYTOLOGY - PAP
Comment: NEGATIVE
Diagnosis: NEGATIVE
Diagnosis: REACTIVE
High risk HPV: NEGATIVE

## 2021-12-22 ENCOUNTER — Ambulatory Visit (HOSPITAL_COMMUNITY): Payer: Medicaid Other

## 2021-12-22 ENCOUNTER — Other Ambulatory Visit (HOSPITAL_COMMUNITY): Payer: Self-pay | Admitting: Obstetrics & Gynecology

## 2021-12-22 DIAGNOSIS — Z1231 Encounter for screening mammogram for malignant neoplasm of breast: Secondary | ICD-10-CM

## 2021-12-29 ENCOUNTER — Ambulatory Visit (HOSPITAL_COMMUNITY)
Admission: RE | Admit: 2021-12-29 | Discharge: 2021-12-29 | Disposition: A | Discharge status: Home / Self Care | Payer: Medicaid Other | Source: Ambulatory Visit | Attending: Obstetrics & Gynecology | Admitting: Obstetrics & Gynecology

## 2021-12-29 DIAGNOSIS — Z1231 Encounter for screening mammogram for malignant neoplasm of breast: Secondary | ICD-10-CM | POA: Diagnosis not present

## 2021-12-30 ENCOUNTER — Other Ambulatory Visit (HOSPITAL_COMMUNITY): Payer: Self-pay | Admitting: Obstetrics & Gynecology

## 2021-12-30 DIAGNOSIS — R928 Other abnormal and inconclusive findings on diagnostic imaging of breast: Secondary | ICD-10-CM

## 2022-01-01 ENCOUNTER — Ambulatory Visit (HOSPITAL_COMMUNITY)
Admission: RE | Admit: 2022-01-01 | Discharge: 2022-01-01 | Disposition: A | Discharge status: Home / Self Care | Payer: Medicaid Other | Source: Ambulatory Visit | Attending: Obstetrics & Gynecology | Admitting: Obstetrics & Gynecology

## 2022-01-01 DIAGNOSIS — R928 Other abnormal and inconclusive findings on diagnostic imaging of breast: Secondary | ICD-10-CM

## 2022-01-06 ENCOUNTER — Other Ambulatory Visit (HOSPITAL_COMMUNITY): Payer: Self-pay | Admitting: Family Medicine

## 2022-01-06 DIAGNOSIS — R002 Palpitations: Secondary | ICD-10-CM

## 2022-01-16 ENCOUNTER — Ambulatory Visit (HOSPITAL_COMMUNITY)
Admission: RE | Admit: 2022-01-16 | Discharge: 2022-01-16 | Disposition: A | Discharge status: Home / Self Care | Payer: Medicaid Other | Source: Ambulatory Visit | Attending: Family Medicine | Admitting: Family Medicine

## 2022-01-16 DIAGNOSIS — R002 Palpitations: Secondary | ICD-10-CM | POA: Insufficient documentation

## 2022-01-16 DIAGNOSIS — I10 Essential (primary) hypertension: Secondary | ICD-10-CM | POA: Diagnosis not present

## 2022-01-16 LAB — ECHOCARDIOGRAM COMPLETE
Area-P 1/2: 2.62 cm2
S' Lateral: 3.1 cm

## 2022-01-16 NOTE — Progress Notes (Signed)
*  PRELIMINARY RESULTS* Echocardiogram 2D Echocardiogram has been performed.  Laurence Drain 01/16/2022, 11:29 AM

## 2022-02-03 ENCOUNTER — Ambulatory Visit (INDEPENDENT_AMBULATORY_CARE_PROVIDER_SITE_OTHER): Payer: Medicaid Other

## 2022-02-03 ENCOUNTER — Ambulatory Visit (INDEPENDENT_AMBULATORY_CARE_PROVIDER_SITE_OTHER): Payer: Medicaid Other | Admitting: Cardiology

## 2022-02-03 ENCOUNTER — Encounter: Payer: Self-pay | Admitting: Cardiology

## 2022-02-03 ENCOUNTER — Other Ambulatory Visit: Payer: Self-pay | Admitting: Cardiology

## 2022-02-03 VITALS — BP 104/70 | HR 92 | Ht 66.0 in | Wt 345.0 lb

## 2022-02-03 DIAGNOSIS — R002 Palpitations: Secondary | ICD-10-CM

## 2022-02-03 NOTE — Progress Notes (Signed)
Cardiology Office Note  Date: 02/03/2022   ID: HLEE FRINGER, DOB [birth year], MRN [id number]  PCP:  Gareth Morgan, MD  Cardiologist:  Nona Dell, MD Electrophysiologist:  None   Chief Complaint  Patient presents with   Palpitations    History of Present Illness: [name] is a 46 y.o. female referred for cardiology consultation by Dr. Sudie Bailey for the evaluation of palpitations.  Cardiac monitor was requested.  I reviewed her history and updated the chart.  She describes an intermittent sense of palpitations described as a fluttering sensation and sometimes a thump.  This can happen when she turns a certain way or bends over, sometimes feels this at nighttime.  She has not had any associated syncope or prolonged palpitations.  Symptoms occur at least twice a week.  I reviewed her ECGs from November, tracings not diagnostic of prior anterior infarct as reported.  High-sensitivity troponin I levels were normal at that time.  She also just recently had an echocardiogram which shows normal LVEF at 55 to 60% with no regional wall motion abnormalities.  I reviewed her medications which are noted below.   Past Medical History:  Diagnosis Date   ADHD    Agoraphobia    Anxiety    Asthma    Bipolar disorder (HCC)    Concussion    DDD (degenerative disc disease), lumbar    Fibromyalgia    GERD (gastroesophageal reflux disease)    History of pneumonia 2015   History of substance abuse (HCC)    Hypertension    Migraine    Mixed hyperlipidemia    Morbid obesity (HCC)    Neuropathy    OSA (obstructive sleep apnea)    Plantar fascial fibromatosis    Spondylosis    Vitamin D deficiency 03/27/2016    Past Surgical History:  Procedure Laterality Date   CYST REMOVAL HAND Left    ESOPHAGEAL DILATION     ESOPHAGOGASTRODUODENOSCOPY  2006   Dr. Rehman:small hh, esophageal dilation.   ESOPHAGOGASTRODUODENOSCOPY (EGD) WITH PROPOFOL N/A 05/15/2019   Dr. Lyna Poser: Esophagitis with  circumferential erosions or excoriations involving 10 cm segment of mid esophagus with overlying exudate.  Most proximal and distal esophagus appeared normal.  Biopsies revealed inflammation only.  Esophagus dilated.   FOOT SURGERY Right    'knot removed"   MALONEY DILATION N/A 05/15/2019   Procedure: MALONEY DILATION;  Surgeon: Corbin Ade, MD;  [facility];  Service: Endoscopy;  Laterality: N/A;   MULTIPLE EXTRACTIONS WITH ALVEOLOPLASTY N/A 05/29/2016   Procedure: MULTIPLE EXTRACTIONS;  Surgeon: Ocie Doyne, DDS;  Location: [facility] OR;  Service: Oral Surgery;  Laterality: N/A;   WISDOM TOOTH EXTRACTION      Current Outpatient Medications  Medication Sig Dispense Refill   albuterol (VENTOLIN HFA) 108 (90 Base) MCG/ACT inhaler Inhale 2 puffs into the lungs every 4 (four) hours as needed for wheezing or shortness of breath.     atorvastatin (LIPITOR) 20 MG tablet Take 20 mg by mouth every evening.      busPIRone (BUSPAR) 10 MG tablet Take 10 mg by mouth 3 (three) times daily. 20 mg twice a day and 10 mg midday.     COMBIVENT RESPIMAT 20-100 MCG/ACT AERS respimat Inhale 1 puff into the lungs 4 (four) times daily.     fluticasone (FLOVENT HFA) 220 MCG/ACT inhaler Inhale 1 puff into the lungs 2 (two) times daily.     furosemide (LASIX) 20 MG tablet Take 20 mg by mouth daily.  gabapentin (NEURONTIN) 400 MG capsule Take 600 mg by mouth 3 (three) times daily.     hydrochlorothiazide (HYDRODIURIL) 25 MG tablet Take 25 mg by mouth daily.     HYDROcodone-acetaminophen (NORCO) 10-325 MG tablet Take 1 tablet by mouth daily as needed.     hydrOXYzine (ATARAX) 50 MG tablet Take 50 mg by mouth as needed.     lamoTRIgine (LAMICTAL) 100 MG tablet Take 100 mg by mouth 2 (two) times daily.     lubiprostone (AMITIZA) 24 MCG capsule Take 1 capsule (24 mcg total) by mouth 2 (two) times daily with a meal. 60 capsule 3   medroxyPROGESTERone (PROVERA) 10 MG tablet Take 1 tablet (10 mg total) by mouth  daily. For the first 10 days of each calendar month 10 tablet 11   methocarbamol (ROBAXIN) 750 MG tablet Take 750 mg by mouth 4 (four) times daily as needed for muscle spasms.      omeprazole (PRILOSEC) 40 MG capsule Take 40 mg by mouth 2 (two) times daily.     ondansetron (ZOFRAN) 4 MG tablet Take 4 mg by mouth every 8 (eight) hours as needed for nausea or vomiting.     prazosin (MINIPRESS) 2 MG capsule Take 2 mg by mouth at bedtime.     Prenatal Vit-Fe Fumarate-FA (PRENATAL COMPLETE PO) Take 1 tablet by mouth daily.      QUEtiapine (SEROQUEL) 300 MG tablet Take 300 mg by mouth at bedtime.     silver sulfADIAZINE (SILVADENE) 1 % cream Use to areas 2-3 times daily as needed 50 g 11   sucralfate (CARAFATE) 1 GM/10ML suspension TAKE 10 MILILITERS BY MOUTH FOUR TIMES DAILY WITH MEALS AND AT BEDTIME 420 mL 1   SYMBICORT 160-4.5 MCG/ACT inhaler Inhale into the lungs.     topiramate (TOPAMAX) 100 MG tablet Take 100 mg by mouth daily.     Vitamin D, Ergocalciferol, (DRISDOL) 1.25 MG (50000 UT) CAPS capsule Take 50,000 Units by mouth every 7 (seven) days.     VRAYLAR 1.5 MG capsule Take 1.5 mg by mouth daily.     ondansetron (ZOFRAN) 4 MG tablet Take 4 mg by mouth 3 (three) times daily as needed for nausea or vomiting.     No current facility-administered medications for this visit.   Allergies:  Penicillins, Sulfa antibiotics, Latex, Nisoldipine, and Other   Social History: The patient  reports that she has been smoking cigarettes. She has a 10.00 pack-year smoking history. She has never used smokeless tobacco. She reports that she does not currently use alcohol. She reports that she does not currently use drugs after having used the following drugs: Cocaine.   Family History: The patient's family history includes Cancer in her maternal grandmother; Diabetes in her paternal grandfather and paternal grandmother; Heart disease in her paternal grandfather and paternal grandmother; Stroke in her paternal  grandfather.   ROS: No orthopnea or PND.  Physical Exam: VS:  BP 104/70   Pulse 92   Ht 5\' 6"  (1.676 m)   Wt (!) 345 lb (156.5 kg)   SpO2 96%   BMI 55.68 kg/m , BMI Body mass index is 55.68 kg/m.  Wt Readings from Last 3 Encounters:  02/03/22 (!) 345 lb (156.5 kg)  06/02/21 (!) 370 lb (167.8 kg)  04/30/20 (!) 408 lb (185.1 kg)    General: Patient appears comfortable at rest. HEENT: Conjunctiva and lids normal, oropharynx clear. Neck: Supple, no elevated JVP or carotid bruits, no thyromegaly. Lungs: Clear to auscultation, nonlabored breathing  at rest. Cardiac: Regular rate and rhythm, no S3 or significant systolic murmur, no pericardial rub. Abdomen: Soft, bowel sounds present. Extremities: Trace ankle edema, distal pulses 2+. Skin: Warm and dry.  ECG:  An ECG dated 06/02/2021 was personally reviewed today and demonstrated:  Sinus rhythm with prolonged PR interval.  Recent Labwork: 06/02/2021: BUN 6; Creatinine, Ser 1.12; Hemoglobin 14.8; Platelets 274; Potassium 3.5; Sodium 137     Component Value Date/Time   CHOL 118 05/26/2017 0747   CHOL 205 (H) 03/19/2016 1516   TRIG 191 (H) 05/26/2017 0747   HDL 24 (L) 05/26/2017 0747   HDL 27 (L) 03/19/2016 1516   CHOLHDL 4.9 05/26/2017 0747   VLDL 38 05/26/2017 0747   LDLCALC 56 05/26/2017 0747   LDLCALC 153 (H) 03/19/2016 1516    Other Studies Reviewed Today:  Echocardiogram 01/16/2022:  1. Left ventricular ejection fraction, by estimation, is 55 to 60%. The  left ventricle has normal function. The left ventricle has no regional  wall motion abnormalities. There is mild left ventricular hypertrophy.  Left ventricular diastolic parameters  are consistent with Grade I diastolic dysfunction (impaired relaxation).   2. Right ventricular systolic function is normal. The right ventricular  size is normal.   3. The mitral valve is normal in structure. Trivial mitral valve  regurgitation. No evidence of mitral stenosis.   4. The  aortic valve is normal in structure. Aortic valve regurgitation is  not visualized. No aortic stenosis is present.   5. The inferior vena cava is normal in size with greater than 50%  respiratory variability, suggesting right atrial pressure of 3 mmHg.   Assessment and Plan:  Intermittent palpitations as discussed above, no associated syncope.  LVEF normal at 55 to 60% without regional wall motion abnormalities.  I reviewed her most recent ECG which shows sinus rhythm with prolonged PR interval, no diagnostic prior infarct pattern.  We will plan a 7-day Zio patch for further investigation.  No changes or additions made to current medications.  Medication Adjustments/Labs and Tests Ordered: Current medicines are reviewed at length with the patient today.  Concerns regarding medicines are outlined above.   Tests Ordered: No orders of the defined types were placed in this encounter.   Medication Changes: No orders of the defined types were placed in this encounter.   Disposition:  Follow up  test results.  Signed, Jonelle Sidle, MD, [facility] 02/03/2022 3:16 PM    [facility] [address] [place], [place] [address] Phone: [phone]; Fax: [phone]

## 2022-02-03 NOTE — Patient Instructions (Addendum)
Medication Instructions:  Your physician recommends that you continue on your current medications as directed. Please refer to the Current Medication list given to you today.   Labwork: None  Testing/Procedures: None  Follow-Up: Follow up pending monitor results  Any Other Special Instructions Will Be Listed Below (If Applicable).     If you need a refill on your cardiac medications before your next appointment, please call your pharmacy.   ZIO XT- Long Term Monitor Instructions   Your physician has requested you wear your ZIO patch monitor___7____days.   This is a single patch monitor.  [employer] supplies one patch monitor per enrollment.  Additional stickers are not available.   Please do not apply patch if you will be having a Nuclear Stress Test, Echocardiogram, Cardiac CT, MRI, or Chest Xray during the time frame you would be wearing the monitor. The patch cannot be worn during these tests.  You cannot remove and re-apply the ZIO XT patch monitor.   Your ZIO patch monitor will be sent USPS Priority mail from [facility] directly to your home address. The monitor may also be mailed to a PO BOX if home delivery is not available.   It may take 3-5 days to receive your monitor after you have been enrolled.   Once you have received you monitor, please review enclosed instructions.  Your monitor has already been registered assigning a specific monitor serial # to you.   Applying the monitor   Shave hair from upper left chest.   Hold abrader disc by orange tab.  Rub abrader in 40 strokes over left upper chest as indicated in your monitor instructions.   Clean area with 4 enclosed alcohol pads .  Use all pads to assure are is cleaned thoroughly.  Let dry.   Apply patch as indicated in monitor instructions.  Patch will be place under collarbone on left side of chest with arrow pointing upward.   Rub patch adhesive wings for 2 minutes.Remove white label marked "1".   Remove white label marked "2".  Rub patch adhesive wings for 2 additional minutes.   While looking in a mirror, press and release button in center of patch.  A small green light will flash 3-4 times .  This will be your only indicator the monitor has been turned on.     Do not shower for the first 24 hours.  You may shower after the first 24 hours.   Press button if you feel a symptom. You will hear a small click.  Record Date, Time and Symptom in the Patient Log Book.   When you are ready to remove patch, follow instructions on last 2 pages of Patient Log Book.  Stick patch monitor onto last page of Patient Log Book.   Place Patient Log Book in [place] box.  Use locking tab on box and tape box closed securely.  The [employer] and [employer] has [employer] on it.  Please place in mailbox as soon as possible.  Your physician should have your test results approximately 7 days after the monitor has been mailed back to [facility].   Call [facility] Customer Care at [phone] if you have questions regarding your ZIO XT patch monitor.  Call them immediately if you see an orange light blinking on your monitor.   If your monitor falls off in less than 4 days contact our Monitor department at [phone].  If your monitor becomes loose or falls off after 4 days call [employer] at  [phone] for suggestions on securing your monitor.

## 2022-02-06 DIAGNOSIS — R002 Palpitations: Secondary | ICD-10-CM | POA: Diagnosis not present

## 2022-09-10 ENCOUNTER — Encounter: Payer: Self-pay | Admitting: Radiology

## 2022-09-11 ENCOUNTER — Emergency Department (HOSPITAL_COMMUNITY): Payer: Medicaid Other

## 2022-09-11 ENCOUNTER — Other Ambulatory Visit: Payer: Self-pay

## 2022-09-11 ENCOUNTER — Emergency Department (HOSPITAL_COMMUNITY)
Admission: EM | Admit: 2022-09-11 | Discharge: 2022-09-11 | Disposition: A | Discharge status: Home / Self Care | Payer: Medicaid Other | Attending: Emergency Medicine | Admitting: Emergency Medicine

## 2022-09-11 ENCOUNTER — Other Ambulatory Visit (HOSPITAL_COMMUNITY): Payer: Self-pay

## 2022-09-11 DIAGNOSIS — E1165 Type 2 diabetes mellitus with hyperglycemia: Secondary | ICD-10-CM | POA: Diagnosis not present

## 2022-09-11 DIAGNOSIS — R739 Hyperglycemia, unspecified: Secondary | ICD-10-CM | POA: Diagnosis present

## 2022-09-11 DIAGNOSIS — Z91040 Latex allergy status: Secondary | ICD-10-CM | POA: Diagnosis not present

## 2022-09-11 DIAGNOSIS — R5383 Other fatigue: Secondary | ICD-10-CM

## 2022-09-11 DIAGNOSIS — I10 Essential (primary) hypertension: Secondary | ICD-10-CM | POA: Insufficient documentation

## 2022-09-11 LAB — CBC WITH DIFFERENTIAL/PLATELET
Abs Immature Granulocytes: 0.1 10*3/uL — ABNORMAL HIGH (ref 0.00–0.07)
Basophils Absolute: 0 10*3/uL (ref 0.0–0.1)
Basophils Relative: 0 %
Eosinophils Absolute: 0 10*3/uL (ref 0.0–0.5)
Eosinophils Relative: 0 %
HCT: 42.9 % (ref 36.0–46.0)
Hemoglobin: 14.5 g/dL (ref 12.0–15.0)
Immature Granulocytes: 1 %
Lymphocytes Relative: 5 %
Lymphs Abs: 0.4 10*3/uL — ABNORMAL LOW (ref 0.7–4.0)
MCH: 31.3 pg (ref 26.0–34.0)
MCHC: 33.8 g/dL (ref 30.0–36.0)
MCV: 92.7 fL (ref 80.0–100.0)
Monocytes Absolute: 0.5 10*3/uL (ref 0.1–1.0)
Monocytes Relative: 5 %
Neutro Abs: 7.8 10*3/uL — ABNORMAL HIGH (ref 1.7–7.7)
Neutrophils Relative %: 89 %
Platelets: 125 10*3/uL — ABNORMAL LOW (ref 150–400)
RBC: 4.63 MIL/uL (ref 3.87–5.11)
RDW: 13.8 % (ref 11.5–15.5)
WBC: 8.8 10*3/uL (ref 4.0–10.5)
nRBC: 0 % (ref 0.0–0.2)

## 2022-09-11 LAB — COMPREHENSIVE METABOLIC PANEL
ALT: 211 U/L — ABNORMAL HIGH (ref 0–44)
AST: 165 U/L — ABNORMAL HIGH (ref 15–41)
Albumin: 3.2 g/dL — ABNORMAL LOW (ref 3.5–5.0)
Alkaline Phosphatase: 234 U/L — ABNORMAL HIGH (ref 38–126)
Anion gap: 10 (ref 5–15)
BUN: 9 mg/dL (ref 6–20)
CO2: 21 mmol/L — ABNORMAL LOW (ref 22–32)
Calcium: 8.4 mg/dL — ABNORMAL LOW (ref 8.9–10.3)
Chloride: 92 mmol/L — ABNORMAL LOW (ref 98–111)
Creatinine, Ser: 1.16 mg/dL — ABNORMAL HIGH (ref 0.44–1.00)
GFR, Estimated: 59 mL/min — ABNORMAL LOW (ref >60)
Glucose, Bld: 611 mg/dL (ref 70–99)
Potassium: 3.8 mmol/L (ref 3.5–5.1)
Sodium: 123 mmol/L — ABNORMAL LOW (ref 135–145)
Total Bilirubin: 7.2 mg/dL — ABNORMAL HIGH (ref 0.3–1.2)
Total Protein: 6.8 g/dL (ref 6.5–8.1)

## 2022-09-11 LAB — CBG MONITORING, ED
Glucose-Capillary: >600 mg/dL (ref 70–99)
Glucose-Capillary: 544 mg/dL (ref 70–99)
Glucose-Capillary: 498 mg/dL — ABNORMAL HIGH (ref 70–99)
Glucose-Capillary: 383 mg/dL — ABNORMAL HIGH (ref 70–99)

## 2022-09-11 LAB — URINALYSIS, ROUTINE W REFLEX MICROSCOPIC
Bacteria, UA: NONE SEEN
Bilirubin Urine: NEGATIVE
Glucose, UA: >=500 mg/dL — AB
Ketones, ur: 20 mg/dL — AB
Leukocytes,Ua: NEGATIVE
Nitrite: NEGATIVE
Protein, ur: 30 mg/dL — AB
Specific Gravity, Urine: 1.031 — ABNORMAL HIGH (ref 1.005–1.030)
pH: 6 (ref 5.0–8.0)

## 2022-09-11 LAB — BETA-HYDROXYBUTYRIC ACID: Beta-Hydroxybutyric Acid: 1.99 mmol/L — ABNORMAL HIGH (ref 0.05–0.27)

## 2022-09-11 LAB — POC URINE PREG, ED: Preg Test, Ur: NEGATIVE

## 2022-09-11 LAB — KETONES, URINE: Ketones, ur: 20 mg/dL — AB

## 2022-09-11 MED ORDER — LANTUS SOLOSTAR 100 UNIT/ML ~~LOC~~ SOPN: 30.0000 [IU] | PEN_INJECTOR | Freq: Every day | SUBCUTANEOUS | 11 refills | Status: DC

## 2022-09-11 MED ORDER — LACTATED RINGERS IV BOLUS
1000.0000 mL | INTRAVENOUS | Status: AC
  Administered 2022-09-11 (×2): 1000 mL via INTRAVENOUS

## 2022-09-11 MED ORDER — INSULIN ASPART 100 UNIT/ML IJ SOLN
15.0000 [IU] | Freq: Once | INTRAMUSCULAR | Status: AC
  Administered 2022-09-11: 15 [IU] via SUBCUTANEOUS
  Filled 2022-09-11: qty 1

## 2022-09-11 MED ORDER — LACTATED RINGERS IV BOLUS
1000.0000 mL | Freq: Once | INTRAVENOUS | Status: AC
  Administered 2022-09-11: 1000 mL via INTRAVENOUS

## 2022-09-11 MED ORDER — NOVOLOG FLEXPEN 100 UNIT/ML ~~LOC~~ SOPN: PEN_INJECTOR | SUBCUTANEOUS | 11 refills | Status: AC

## 2022-09-11 MED ORDER — INSULIN PEN NEEDLE 32G X 4 MM MISC: 1.0000 | 1 refills | Status: AC | PRN

## 2022-09-11 NOTE — ED Notes (Signed)
PA made aware of pt glucose level on CMP

## 2022-09-11 NOTE — ED Notes (Signed)
CBG reading HI in triage

## 2022-09-11 NOTE — ED Notes (Signed)
Cbg 498 after 15 units of insulin subcutaneous--PA-C made aware

## 2022-09-11 NOTE — Inpatient Diabetes Management (Addendum)
Inpatient Diabetes Program Recommendations  AACE/ADA: New Consensus Statement on Inpatient Glycemic Control   Target Ranges:  Prepandial:   less than 140 mg/dL      Peak postprandial:   less than 180 mg/dL (1-2 hours)      Critically ill patients:  140 - 180 mg/dL    Latest Reference Range & Units 09/11/22 14:04  CO2 22 - 32 mmol/L 21 (L)  Glucose 70 - 99 mg/dL 611 (HH)  Anion gap 5 - 15  10    Latest Reference Range & Units 09/11/22 13:12  Glucose-Capillary 70 - 99 mg/dL >600 (HH)    Latest Reference Range & Units 05/26/17 07:47  Hemoglobin A1C 4.8 - 5.6 % 5.7 (H)   Review of Glycemic Control  Diabetes history: DM2 Outpatient Diabetes medications: None Current orders for Inpatient glycemic control: None  Inpatient Diabetes Program Recommendations:    Insulin: Would recommend starting patient on insulin; may want to consider starting Semglee 30 units daily (based on 158 kg x 0.2 units) and Novolog 0-15 units AC&HS with meals for correction.  Outpatient: IF patient is discharged on insulin, her insurance covers Lantus and Novolog and patient would prefer to use insulin pens. Patient would need Rx for Lantus Solostar pens [phone]; Novolog Flexpens [id number], and insulin pen needles [phone].    NOTE: Patient in ED at this time. Per ED triage note, patient "states she was on prednisone for 12 days that finished 5 days ago along with antibiotic. Has DM type 2 non insulin dependent. States she started feeling bad, checked her glucose & it read high. States her vision is blurry about a week ago but didn't think about it being her BG until feeling worse leading to check her sugar. " In reviewing chart, noted patient sees Dr. Karie Kirks as PCP (not able to see any PCP notes in chart) and last A1C in [facility] was 5.7% on 05/26/17. Current CMET and beta-hydroxybutyric acid in process. Will plan to speak with patient.  Addendum 09/11/22'@15'$ :20-Spoke with patient at bedside regarding DM. Patient reports that  she is not sure how long ago she was dx with DM and that she has not ever taken any DM medications but her PCP is in the process of trying to get insurance approval for Jardiance and Ozempic. Patient states that she can not take Metformin due to renal function and it interferes with some of her psych meds. Patient reports that she took the Prednisone and antibiotics her PCP prescribed recently and her PCP told her the steroids would increase her sugars some but she was not aware that it would make her feel so poorly. Patient states that she had been checking her sugar some in December 2023 and it was 300-400's mg/dl at that time but she stopped checking it. She reports she checked it over the past couple of days because she was feeling so bad and having symptoms of hyperglycemia.  Patient states she does not remember what her prior A1C was.  Discussed glucose and A1C goals. Discussed importance of checking CBGs and maintaining good CBG control to prevent long-term and short-term complications. Explained how hyperglycemia leads to damage within blood vessels which lead to the common complications seen with uncontrolled diabetes. Stressed to the patient the importance of improving glycemic control to prevent further complications from uncontrolled diabetes. Discussed impact of nutrition, exercise, stress, sickness, and medications on diabetes control.  Patient states that she tries to watch her carbohydrates but notes she drinks one glass of V-8  Splash in the mornings and 1 glass of sweet tea in the evening with supper. She notes she eats a lot of sandwiches but uses whole wheat bread. Discussed carbohydrates, carbohydrate goals per day and meal, along with portion sizes. Encouraged patient to review nutrition labels so she can be aware of carbohydrates and try to limit them to 50-75 grams per meal.  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  Explained that given her glucose was over 600 mg/dl initially and that her glucose was in the 300-400's in December before steroid use, she likely needs to be started on insulin to get DM under control and then follow up with PCP for adjustments or to see if appropriate to switch to other DM medications.  Patient states she would be agreeable to take insulin if prescribed.  Discussed insulin and how it works; short and long acting insulin, injection sites, insulin storage, and importance of rotating insulin injection sites. Educated patient on insulin pen use at home. Reviewed all steps of insulin pen including attachment of needle, 2-unit air shot, dialing up dose, giving injection, removing needle, disposal of sharps, storage of unused insulin, disposal of insulin etc. Patient able to provide successful return demonstration. Reviewed hypoglycemia, symptoms, along with treatment. Encouraged patient to take medication as prescribed at discharge and to follow up with PCP early next week. Asked patient to check glucose 3-4 times a day and to be sure to reach out to PCP if she has any issues with hypoglycemia so they can advise her on adjustments. Informed patient that I would recommend using insulin for now but it would be up to attending provider as to what she is discharged on. Patient verbalized understanding of information discussed and reports no further questions at this time related to diabetes. Had [facility] outpatient pharmacy check which insulins are covered and Lantus and Novolog are preferred insulins with insurance.    Thanks, Barnie Alderman, RN, MSN, [place] Diabetes Coordinator Inpatient Diabetes Program [phone] (Team Pager from 8am to [place])

## 2022-09-11 NOTE — ED Notes (Signed)
RN notified of patients CBG.

## 2022-09-11 NOTE — ED Triage Notes (Signed)
Pt states she was on prednisone for 12 days that finished 5 days ago along with antibiotic.  Has DM type 2 non insulin dependent.  States she started feeling bad, checked her glucose & it read high.  States her vision is blurry about a week ago but didn't think about it being her BG until feeling worse leading to check her sugar.  States she is having difficulty staying awake with weakness.

## 2022-09-11 NOTE — Discharge Instructions (Addendum)
I would like for you to follow-up with your primary care doctor next week to establish a insulin regimen.  If your sugars are high this weekend please follow the suggestions below for correcting doses.  200-300        4 units  300-400        6 units 400-500        8 units   >500            10 units    Please return to the emergency room for any worsening symptoms you might have.  I suspect that the the prednisone your recent illness is what caused your elevated sugars today.

## 2022-09-11 NOTE — ED Provider Notes (Signed)
[facility] Provider Note   CSN: [id number] Arrival date & time: 09/11/22  1251     History Chief Complaint  Patient presents with   Hyperglycemia    [name] is a 47 y.o. female patient with history of bipolar disorder, GERD, hypertension, non-insulin-dependent diabetes, and morbid obesity who presents to the emergency department today for further evaluation of elevated blood sugars at home.  Patient states that she recently got off of prednisone secondary to respiratory illness 5 days ago.  Patient started feeling under the weather roughly a week ago but did not check her sugars.  She checked her blood sugar roughly 3 days ago and noticed that it was in the 500s consistently.  She reports associated fatigue, shortness of breath, polydipsia, xerostomia, upper abdominal pain when she coughs, nausea with 1 episode of vomiting today.  She denies any polyuria, diarrhea, polyphasia, fever, chills.  Hyperglycemia      Home Medications Prior to Admission medications   Medication Sig Start Date End Date Taking? Authorizing Provider  albuterol (VENTOLIN HFA) 108 (90 Base) MCG/ACT inhaler Inhale 2 puffs into the lungs every 4 (four) hours as needed for wheezing or shortness of breath.   Yes [provider]  atorvastatin (LIPITOR) 20 MG tablet Take 20 mg by mouth every evening.    Yes [provider]  busPIRone (BUSPAR) 15 MG tablet Take 15 mg by mouth 3 (three) times daily. 09/09/22  Yes [provider]  COMBIVENT RESPIMAT 20-100 MCG/ACT AERS respimat Inhale 1 puff into the lungs 4 (four) times daily. 07/17/21  Yes [provider]  Cyanocobalamin (VITAMIN B-12) 2000 MCG TBCR Take 1 tablet by mouth daily.   Yes [provider]  fluconazole (DIFLUCAN) 100 MG tablet Take 100 mg by mouth daily. Start on 08/26/22 for 10 days 08/26/22  Yes [provider]  fluticasone (FLOVENT HFA) 220 MCG/ACT inhaler  Inhale 1 puff into the lungs 2 (two) times daily.   Yes [provider]  furosemide (LASIX) 20 MG tablet Take 20 mg by mouth daily. 10/09/21  Yes [provider]  gabapentin (NEURONTIN) 600 MG tablet Take 600 mg by mouth 3 (three) times daily. 09/09/22  Yes [provider]  hydrochlorothiazide (HYDRODIURIL) 25 MG tablet Take 25 mg by mouth daily.   Yes [provider]  HYDROcodone-acetaminophen (NORCO) 10-325 MG tablet Take 1 tablet by mouth daily as needed. 09/04/21  Yes [provider]  hydrOXYzine (ATARAX) 25 MG tablet Take 25 mg by mouth 2 (two) times daily. 09/09/22  Yes [provider]  insulin aspart (NOVOLOG FLEXPEN) 100 UNIT/ML FlexPen Use sliding scale until you follow up with your PCP and establish a regimen for meals. 09/11/22  Yes Raul Del, Mandela Bello M, PA-C  insulin glargine (LANTUS SOLOSTAR) 100 UNIT/ML Solostar Pen Inject 30 Units into the skin daily. 09/11/22  Yes Raul Del, Marx Doig M, PA-C  Insulin Pen Needle 32G X 4 MM MISC 1 Needle by Does not apply route as needed. 09/11/22  Yes Raul Del, Atari Novick M, PA-C  lamoTRIgine (LAMICTAL) 100 MG tablet Take 100 mg by mouth 2 (two) times daily.   Yes [provider]  medroxyPROGESTERone (PROVERA) 10 MG tablet Take 1 tablet (10 mg total) by mouth daily. For the first 10 days of each calendar month 11/13/21  Yes Eure, Mertie Clause, MD  methocarbamol (ROBAXIN) 750 MG tablet Take 750 mg by mouth 4 (four) times daily as needed for muscle spasms.  Yes [provider]  omeprazole (PRILOSEC) 40 MG capsule Take 40 mg by mouth 2 (two) times daily.   Yes [provider]  ondansetron (ZOFRAN) 4 MG tablet Take 4 mg by mouth every 8 (eight) hours as needed for nausea or vomiting.   Yes [provider]  prazosin (MINIPRESS) 2 MG capsule Take 2 mg by mouth at bedtime.   Yes [provider]  Prenatal Vit-Fe Fumarate-FA (PRENATAL COMPLETE PO) Take 1 tablet by mouth daily.    Yes  [provider]  QUEtiapine (SEROQUEL) 300 MG tablet Take 300 mg by mouth at bedtime.   Yes [provider]  silver sulfADIAZINE (SILVADENE) 1 % cream Use to areas 2-3 times daily as needed 11/13/21  Yes Eure, Mertie Clause, MD  SYMBICORT 160-4.5 MCG/ACT inhaler Inhale 2 puffs into the lungs 2 (two) times daily. 10/09/21  Yes [provider]  topiramate (TOPAMAX) 100 MG tablet Take 100 mg by mouth daily.   Yes [provider]  Vitamin D, Ergocalciferol, (DRISDOL) 1.25 MG (50000 UT) CAPS capsule Take 50,000 Units by mouth every 7 (seven) days.   Yes [provider]  ondansetron (ZOFRAN) 4 MG tablet Take 4 mg by mouth 3 (three) times daily as needed for nausea or vomiting. Patient not taking: Reported on 09/11/2022    [provider]      Allergies    Penicillins, Sulfa antibiotics, Latex, Nisoldipine, and Other    Review of Systems   Review of Systems  All other systems reviewed and are negative.   Physical Exam Updated Vital Signs BP 105/74   Pulse 97   Temp 98 F (36.7 C) (Oral)   Resp 15   Ht '5\' 6"'$  (1.676 m)   Wt (!) 158.3 kg   SpO2 95%   BMI 56.33 kg/m  Physical Exam Vitals and nursing note reviewed.  Constitutional:      General: She is not in acute distress.    Appearance: Normal appearance.  HENT:     Head: Normocephalic and atraumatic.  Eyes:     General:        Right eye: No discharge.        Left eye: No discharge.  Cardiovascular:     Comments: Regular rate and rhythm.  S1/S2 are distinct without any evidence of murmur, rubs, or gallops.  Radial pulses are 2+ bilaterally.  Dorsalis pedis pulses are 2+ bilaterally.  No evidence of pedal edema. Pulmonary:     Comments: Clear to auscultation bilaterally.  Normal effort.  No respiratory distress.  No evidence of wheezes, rales, or rhonchi heard throughout. Abdominal:     General: Abdomen is flat. Bowel sounds are normal. There is no distension.     Tenderness: There is  no guarding or rebound.     Comments: Mild upper abdominal tenderness.  Musculoskeletal:        General: Normal range of motion.     Cervical back: Neck supple.  Skin:    General: Skin is warm and dry.     Findings: No rash.  Neurological:     General: No focal deficit present.     Mental Status: She is alert.  Psychiatric:        Mood and Affect: Mood normal.        Behavior: Behavior normal.     ED Results / Procedures / Treatments   Labs (all labs ordered are listed, but only abnormal results are displayed) Labs Reviewed  CBC WITH  DIFFERENTIAL/PLATELET - Abnormal; Notable for the following components:      Result Value   Platelets 125 (*)    Neutro Abs 7.8 (*)    Lymphs Abs 0.4 (*)    Abs Immature Granulocytes 0.10 (*)    All other components within normal limits  COMPREHENSIVE METABOLIC PANEL - Abnormal; Notable for the following components:   Sodium 123 (*)    Chloride 92 (*)    CO2 21 (*)    Glucose, Bld 611 (*)    Creatinine, Ser 1.16 (*)    Calcium 8.4 (*)    Albumin 3.2 (*)    AST 165 (*)    ALT 211 (*)    Alkaline Phosphatase 234 (*)    Total Bilirubin 7.2 (*)    GFR, Estimated 59 (*)    All other components within normal limits  BETA-HYDROXYBUTYRIC ACID - Abnormal; Notable for the following components:   Beta-Hydroxybutyric Acid 1.99 (*)    All other components within normal limits  CBG MONITORING, ED - Abnormal; Notable for the following components:   Glucose-Capillary >600 (*)    All other components within normal limits  CBG MONITORING, ED - Abnormal; Notable for the following components:   Glucose-Capillary 544 (*)    All other components within normal limits  CBG MONITORING, ED - Abnormal; Notable for the following components:   Glucose-Capillary 498 (*)    All other components within normal limits  CBG MONITORING, ED - Abnormal; Notable for the following components:   Glucose-Capillary 383 (*)    All other components within normal limits   URINALYSIS, ROUTINE W REFLEX MICROSCOPIC  KETONES, URINE  POC URINE PREG, ED    EKG None  Radiology DG Chest 2 View  Result Date: 09/11/2022 CLINICAL DATA:  Shortness of breath.  Hyperglycemia EXAM: CHEST - 2 VIEW COMPARISON:  Chest x-ray 06/02/2021 FINDINGS: Underinflation. No consolidation, pneumothorax or effusion. No edema. Normal cardiopericardial silhouette. Film is under penetrated. Overlapping cardiac leads. Left midlung scar or atelectasis. Similar changes of the right lung base. Films are under penetrated. IMPRESSION: Underinflation with basilar scar atelectasis. Under penetrated radiographs Electronically Signed   By: Jill Side M.D.   On: 09/11/2022 14:46    Procedures Procedures    Medications Ordered in ED Medications  lactated ringers bolus 1,000 mL (1,000 mLs Intravenous Bolus 09/11/22 1433)  insulin aspart (novoLOG) injection 15 Units (15 Units Subcutaneous Given 09/11/22 1521)  lactated ringers bolus 1,000 mL (1,000 mLs Intravenous Bolus 09/11/22 1628)    ED Course/ Medical Decision Making/ A&P Clinical Course as of 09/11/22 1734  Fri Sep 11, 2022  1618 On reevaluation, patient is more alert and states that she is feeling somewhat better.  She had a large amount of urine output.  I will give her another liter of lactated Ringer's.  Glucose is downtrending. [CF]  1718 CBC with Differential (PNL)(!) Normal. [CF]  1718 Beta-hydroxybutyric acid(!) Elevated. [CF]  1718 Comprehensive metabolic panel(!!) Initial sugar is 611.  Anion gap is normal.  No evidence of DKA.  Potassium is also normal.  There is evidence of hyponatremia in the setting of significant hyperglycemia. [CF]  [id number] DG Chest 2 View I personally ordered and interpreted the study and do not see any evidence of pneumonia. [CF]  [id number] POC urine preg, ED Negative.  [CF]  [id number] POC CBG, ED(!) After several rounds of fluids and insulin her blood sugar has dropped to 383 and is safe for discharge. [CF]     Clinical  Course User Index [CF] Hendricks Limes, PA-C   {   Click here for ABCD2, HEART and other calculators  Medical Decision Making [name] is a 47 y.o. female patient with history of non-insulin-dependent diabetes who presents to the emergency department with elevated blood sugars.  Patient's initial blood sugar here was greater than 600.  I suspect this is likely secondary to the prednisone use from her recent illness. I will get some basic labs, chest x-ray, EKG, fluids, beta-hydroxybutyrate.  Patient did have some O2 saturations in the high 80s briefly.  She was placed on 2 L of oxygen via nasal cannula and immediately came back up to the low 90s.  Patient does not have a diagnosis of chronic lung disease that I can see in her chart.  I have a low suspicion for DKA at this time but will evaluate further with labs.  No evidence of DKA or HHS today.  Sugar is continue to downtrend after 2 L of fluid and insulin.  I spoke with the diabetes coordinator who evaluate the patient at bedside and gave recommendations based on her findings.  We will start her on basal and bolus insulin.  I will give her a sliding scale for the weekend and she will establish a more thorough scale when she follows with her PCP early next week.  All questions or concerns addressed.  She is safer discharge at this time.  Amount and/or Complexity of Data Reviewed Labs: ordered. Decision-making details documented in ED Course. Radiology: ordered. Decision-making details documented in ED Course.  Risk Prescription drug management.   Final Clinical Impression(s) / ED Diagnoses Final diagnoses:  Hyperglycemia  Other fatigue    Rx / DC Orders ED Discharge Orders          Ordered    insulin glargine (LANTUS SOLOSTAR) 100 UNIT/ML Solostar Pen  Daily        09/11/22 1725    insulin aspart (NOVOLOG FLEXPEN) 100 UNIT/ML FlexPen        09/11/22 1725    Insulin Pen Needle 32G X 4 MM MISC  As needed         09/11/22 1725              Hendricks Limes, PA-C 09/11/22 1734    Milton Ferguson, MD 09/12/22 [phone]

## 2022-09-11 NOTE — ED Notes (Signed)
Pt placed on 2 L [place] due to SpO2 being 88% on RA

## 2022-09-14 ENCOUNTER — Other Ambulatory Visit: Payer: Self-pay | Admitting: *Deleted

## 2022-09-14 ENCOUNTER — Telehealth: Payer: Self-pay | Admitting: *Deleted

## 2022-10-12 ENCOUNTER — Other Ambulatory Visit: Payer: Self-pay | Admitting: Obstetrics & Gynecology

## 2023-05-25 ENCOUNTER — Ambulatory Visit
Admission: EM | Admit: 2023-05-25 | Discharge: 2023-05-25 | Disposition: A | Discharge status: Home / Self Care | Payer: Medicaid Other | Attending: Family Medicine | Admitting: Family Medicine

## 2023-05-25 DIAGNOSIS — J4541 Moderate persistent asthma with (acute) exacerbation: Secondary | ICD-10-CM | POA: Diagnosis not present

## 2023-05-25 DIAGNOSIS — J22 Unspecified acute lower respiratory infection: Secondary | ICD-10-CM

## 2023-05-25 MED ORDER — DOXYCYCLINE HYCLATE 100 MG PO CAPS: 100.0000 mg | ORAL_CAPSULE | Freq: Two times a day (BID) | ORAL | 0 refills | Status: DC

## 2023-05-25 MED ORDER — PROMETHAZINE-DM 6.25-15 MG/5ML PO SYRP: 5.0000 mL | ORAL_SOLUTION | Freq: Four times a day (QID) | ORAL | 0 refills | Status: DC | PRN

## 2023-05-25 NOTE — ED Provider Notes (Signed)
[facility]    CSN: [id number] Arrival date & time: 05/25/23  1907      History   Chief Complaint No chief complaint on file.   HPI [name] is a 47 y.o. female.   Patient presenting today with 6-week history of progressive worsening cough, chest congestion, shortness of breath, wheezing, sinus pain and pressure, congestion, fatigue.  Denies chest pain, abdominal pain, nausea vomiting diarrhea, fevers.  So far trying her Combivent and albuterol regimen for asthma and COPD, over-the-counter remedies with no relief.    Past Medical History:  Diagnosis Date   ADHD    Agoraphobia    Anxiety    Asthma    Bipolar disorder (HCC)    Concussion    DDD (degenerative disc disease), lumbar    Fibromyalgia    GERD (gastroesophageal reflux disease)    History of pneumonia 2015   History of substance abuse (HCC)    Hypertension    Migraine    Mixed hyperlipidemia    Morbid obesity (HCC)    Neuropathy    OSA (obstructive sleep apnea)    Plantar fascial fibromatosis    Spondylosis    Vitamin D deficiency 03/27/2016    Patient Active Problem List   Diagnosis Date Noted   Essential hypertension 01/25/2020   Fatigue 01/25/2020   Hypersomnia 01/25/2020   Morbid obesity (HCC) 01/25/2020   Obstructive sleep apnea of adult 01/25/2020   GERD (gastroesophageal reflux disease)    Esophageal dysphagia    Abdominal pain, epigastric    Constipation    Bipolar 1 disorder, mixed, moderate (HCC)    Bipolar 1 disorder, depressed (HCC) 05/24/2017   Vitamin D deficiency 03/27/2016   Dyslipidemia (high LDL; low HDL) 03/27/2016   Elevated serum creatinine 03/27/2016   Cervical disc disorder with radiculopathy of cervical region 12/20/2014    Past Surgical History:  Procedure Laterality Date   CYST REMOVAL HAND Left    ESOPHAGEAL DILATION     ESOPHAGOGASTRODUODENOSCOPY  2006   Dr. Rehman:small hh, esophageal dilation.   ESOPHAGOGASTRODUODENOSCOPY (EGD) WITH PROPOFOL  N/A 05/15/2019   Dr. Lyna Poser: Esophagitis with circumferential erosions or excoriations involving 10 cm segment of mid esophagus with overlying exudate.  Most proximal and distal esophagus appeared normal.  Biopsies revealed inflammation only.  Esophagus dilated.   FOOT SURGERY Right    'knot removed"   MALONEY DILATION N/A 05/15/2019   Procedure: MALONEY DILATION;  Surgeon: Corbin Ade, MD;  Location: [facility];  Service: Endoscopy;  Laterality: N/A;   MULTIPLE EXTRACTIONS WITH ALVEOLOPLASTY N/A 05/29/2016   Procedure: MULTIPLE EXTRACTIONS;  Surgeon: Ocie Doyne, DDS;  Location: [facility] OR;  Service: Oral Surgery;  Laterality: N/A;   WISDOM TOOTH EXTRACTION      OB History     Gravida  3   Para  1   Term  1   Preterm      AB  2   Living  1      SAB  2   IAB      Ectopic      Multiple      Live Births               Home Medications    Prior to Admission medications   Medication Sig Start Date End Date Taking? Authorizing Provider  doxycycline (VIBRAMYCIN) 100 MG capsule Take 1 capsule (100 mg total) by mouth 2 (two) times daily. 05/25/23  Yes Particia Nearing, PA-C  promethazine-dextromethorphan (PROMETHAZINE-DM)  6.25-15 MG/5ML syrup Take 5 mLs by mouth 4 (four) times daily as needed. 05/25/23  Yes Particia Nearing, PA-C  albuterol (VENTOLIN HFA) 108 (90 Base) MCG/ACT inhaler Inhale 2 puffs into the lungs every 4 (four) hours as needed for wheezing or shortness of breath.    [provider]  atorvastatin (LIPITOR) 20 MG tablet Take 20 mg by mouth every evening.     [provider]  busPIRone (BUSPAR) 15 MG tablet Take 15 mg by mouth 3 (three) times daily. 09/09/22   [provider]  COMBIVENT RESPIMAT 20-100 MCG/ACT AERS respimat Inhale 1 puff into the lungs 4 (four) times daily. 07/17/21   [provider]  Cyanocobalamin (VITAMIN B-12) 2000 MCG TBCR Take 1 tablet by mouth daily.    [provider]  fluconazole  (DIFLUCAN) 100 MG tablet Take 100 mg by mouth daily. Start on 08/26/22 for 10 days 08/26/22   [provider]  fluticasone (FLOVENT HFA) 220 MCG/ACT inhaler Inhale 1 puff into the lungs 2 (two) times daily.    [provider]  furosemide (LASIX) 20 MG tablet Take 20 mg by mouth daily. 10/09/21   [provider]  gabapentin (NEURONTIN) 600 MG tablet Take 600 mg by mouth 3 (three) times daily. 09/09/22   [provider]  hydrochlorothiazide (HYDRODIURIL) 25 MG tablet Take 25 mg by mouth daily.    [provider]  HYDROcodone-acetaminophen (NORCO) 10-325 MG tablet Take 1 tablet by mouth daily as needed. 09/04/21   [provider]  hydrOXYzine (ATARAX) 25 MG tablet Take 25 mg by mouth 2 (two) times daily. 09/09/22   [provider]  insulin aspart (NOVOLOG FLEXPEN) 100 UNIT/ML FlexPen Use sliding scale until you follow up with your PCP and establish a regimen for meals. 09/11/22   Honor Loh M, PA-C  insulin glargine (LANTUS SOLOSTAR) 100 UNIT/ML Solostar Pen Inject 30 Units into the skin daily. 09/11/22   Honor Loh M, PA-C  Insulin Pen Needle 32G X 4 MM MISC 1 Needle by Does not apply route as needed. 09/11/22   Teressa Lower, PA-C  lamoTRIgine (LAMICTAL) 100 MG tablet Take 100 mg by mouth 2 (two) times daily.    [provider]  medroxyPROGESTERone (PROVERA) 10 MG tablet TAKE 1 TABLET BY MOUTH DAILY FOR THE first 10 DAYS of each calender MONTH 10/26/22   Lazaro Arms, MD  methocarbamol (ROBAXIN) 750 MG tablet Take 750 mg by mouth 4 (four) times daily as needed for muscle spasms.     [provider]  omeprazole (PRILOSEC) 40 MG capsule Take 40 mg by mouth 2 (two) times daily.    [provider]  ondansetron (ZOFRAN) 4 MG tablet Take 4 mg by mouth 3 (three) times daily as needed for nausea or vomiting. Patient not taking: Reported on 09/11/2022    [provider]  ondansetron (ZOFRAN) 4 MG tablet Take 4  mg by mouth every 8 (eight) hours as needed for nausea or vomiting.    [provider]  prazosin (MINIPRESS) 2 MG capsule Take 2 mg by mouth at bedtime.    [provider]  Prenatal Vit-Fe Fumarate-FA (PRENATAL COMPLETE PO) Take 1 tablet by mouth daily.     [provider]  QUEtiapine (SEROQUEL) 300 MG tablet Take 300 mg by mouth at bedtime.    [provider]  silver sulfADIAZINE (SILVADENE) 1 % cream Use to areas 2-3 times daily as needed 11/13/21   Lazaro Arms, MD  SYMBICORT 160-4.5 MCG/ACT inhaler Inhale 2 puffs into the lungs 2 (two) times daily. 10/09/21   [provider]  topiramate (TOPAMAX) 100 MG tablet Take 100 mg by mouth daily.    [provider]  Vitamin D, Ergocalciferol, (DRISDOL) 1.25 MG (50000 UT) CAPS capsule Take 50,000 Units by mouth every 7 (seven) days.    [provider]    Family History Family History  Problem Relation Age of Onset   Cancer Maternal Grandmother        lung, back   Diabetes Paternal Grandmother    Heart disease Paternal Grandmother    Diabetes Paternal Grandfather    Heart disease Paternal Grandfather    Stroke Paternal Grandfather     Social History Social History   Tobacco Use   Smoking status: Every Day    Current packs/day: 1.00    Average packs/day: 1 pack/day for 10.0 years (10.0 ttl pk-yrs)    Types: Cigarettes   Smokeless tobacco: Never  Vaping Use   Vaping status: Never Used  Substance Use Topics   Alcohol use: Not Currently   Drug use: Not Currently    Types: Cocaine     Allergies   Penicillins, Sulfa antibiotics, Latex, Nisoldipine, and Other   Review of Systems Review of Systems Per HPI  Physical Exam Triage Vital Signs ED Triage Vitals  Encounter Vitals Group     BP 05/25/23 1908 (!) 148/78     Systolic BP Percentile --      Diastolic BP Percentile --      Pulse Rate 05/25/23 1908 98     Resp 05/25/23 1908 20     Temp 05/25/23 1908 98.1 F  (36.7 C)     Temp Source 05/25/23 1908 Oral     SpO2 05/25/23 1908 91 %     Weight --      Height --      Head Circumference --      Peak Flow --      Pain Score 05/25/23 1912 6     Pain Loc --      Pain Education --      Exclude from Growth Chart --    No data found.  Updated Vital Signs BP (!) 148/78 (BP Location: Right Arm)   Pulse 98   Temp 98.1 F (36.7 C) (Oral)   Resp 20   SpO2 91%   Visual Acuity Right Eye Distance:   Left Eye Distance:   Bilateral Distance:    Right Eye Near:   Left Eye Near:    Bilateral Near:     Physical Exam Vitals and nursing note reviewed.  Constitutional:      Appearance: Normal appearance.  HENT:     Head: Atraumatic.     Right Ear: Tympanic membrane and external ear normal.     Left Ear: Tympanic membrane and external ear normal.     Nose: Congestion present.     Mouth/Throat:     Mouth: Mucous membranes are moist.     Pharynx: Posterior oropharyngeal erythema present.  Eyes:     Extraocular Movements: Extraocular movements intact.     Conjunctiva/sclera: Conjunctivae normal.  Cardiovascular:     Rate and Rhythm: Normal rate and regular rhythm.     Heart sounds: Normal heart sounds.  Pulmonary:     Effort: Pulmonary effort is normal.     Breath sounds: Wheezing and rales present.  Musculoskeletal:        General: Normal range  of motion.     Cervical back: Normal range of motion and neck supple.  Skin:    General: Skin is warm and dry.  Neurological:     Mental Status: She is alert and oriented to person, place, and time.  Psychiatric:        Mood and Affect: Mood normal.        Thought Content: Thought content normal.      UC Treatments / Results  Labs (all labs ordered are listed, but only abnormal results are displayed) Labs Reviewed - No data to display  EKG   Radiology No results found.  Procedures Procedures (including critical care time)  Medications Ordered in UC Medications - No data to  display  Initial Impression / Assessment and Plan / UC Course  I have reviewed the triage vital signs and the nursing notes.  Pertinent labs & imaging results that were available during my care of the patient were reviewed by me and considered in my medical decision making (see chart for details).     Mildly hypertensive in triage and oxygen saturation 91% on room air.  She is overall well-appearing and in no acute distress, speaking in full sentences but lungs with moderate wheezes, crackles.  Treat with doxycycline for suspected pneumonia, Phenergan DM, continue albuterol and Combivent.  Follow-up for worsening symptoms.  Final Clinical Impressions(s) / UC Diagnoses   Final diagnoses:  Lower respiratory infection  Moderate persistent asthma with acute exacerbation   Discharge Instructions   None    ED Prescriptions     Medication Sig Dispense Auth. Provider   doxycycline (VIBRAMYCIN) 100 MG capsule Take 1 capsule (100 mg total) by mouth 2 (two) times daily. 20 capsule Particia Nearing, [place]   promethazine-dextromethorphan (PROMETHAZINE-DM) 6.25-15 MG/5ML syrup Take 5 mLs by mouth 4 (four) times daily as needed. 100 mL Particia Nearing, [place]      PDMP not reviewed this encounter.   Particia Nearing, [place] 05/25/23 1949

## 2023-05-25 NOTE — ED Triage Notes (Signed)
Pt reports cough x 6 weeks , pt states she has COPD and when she has a cold it is hard to get rid of, she has now developed pain in her upper back, that hurts when she coughs deep.

## 2023-08-03 DIAGNOSIS — F4011 Social phobia, generalized: Secondary | ICD-10-CM | POA: Diagnosis not present

## 2023-08-03 DIAGNOSIS — F411 Generalized anxiety disorder: Secondary | ICD-10-CM | POA: Diagnosis not present

## 2023-08-03 DIAGNOSIS — F4310 Post-traumatic stress disorder, unspecified: Secondary | ICD-10-CM | POA: Diagnosis not present

## 2023-08-03 DIAGNOSIS — F3181 Bipolar II disorder: Secondary | ICD-10-CM | POA: Diagnosis not present

## 2023-08-05 ENCOUNTER — Ambulatory Visit
Admission: EM | Admit: 2023-08-05 | Discharge: 2023-08-05 | Disposition: A | Discharge status: Home / Self Care | Payer: Medicaid Other

## 2023-08-05 DIAGNOSIS — E1165 Type 2 diabetes mellitus with hyperglycemia: Secondary | ICD-10-CM | POA: Diagnosis not present

## 2023-08-05 DIAGNOSIS — J441 Chronic obstructive pulmonary disease with (acute) exacerbation: Secondary | ICD-10-CM

## 2023-08-05 DIAGNOSIS — Z794 Long term (current) use of insulin: Secondary | ICD-10-CM | POA: Diagnosis not present

## 2023-08-05 MED ORDER — DOXYCYCLINE HYCLATE 100 MG PO CAPS: 100.0000 mg | ORAL_CAPSULE | Freq: Two times a day (BID) | ORAL | 0 refills | Status: AC

## 2023-08-05 MED ORDER — BENZONATATE 100 MG PO CAPS: 100.0000 mg | ORAL_CAPSULE | Freq: Three times a day (TID) | ORAL | 0 refills | Status: DC | PRN

## 2023-08-05 MED ORDER — PROMETHAZINE-DM 6.25-15 MG/5ML PO SYRP: 5.0000 mL | ORAL_SOLUTION | Freq: Four times a day (QID) | ORAL | 0 refills | Status: DC | PRN

## 2023-08-05 MED ORDER — LANTUS SOLOSTAR 100 UNIT/ML ~~LOC~~ SOPN: 50.0000 [IU] | PEN_INJECTOR | Freq: Every day | SUBCUTANEOUS | 0 refills | Status: DC

## 2023-08-05 NOTE — Discharge Instructions (Signed)
We are treating you for COPD exacerbation today with doxycycline and cough suppressant medicine.  Continue inhalers and mucinex.  Seek care emergently if symptoms do not improve in the next couple of days.   Continue Lantus 50 units nightly and keep follow up with PCP in March.

## 2023-08-05 NOTE — ED Triage Notes (Signed)
Pt reports she has chest congestion, coughing, wheezing, nasal pressure, pus like mucus x 2 weeks.    Pt reports she is on her last 60 units of insulin and needs a refiil until she an see her provider.   PCP appointment isnt until 03/06

## 2023-08-05 NOTE — ED Provider Notes (Signed)
[facility]    CSN: [id number] Arrival date & time: 08/05/23  1807      History   Chief Complaint No chief complaint on file.   HPI [name] is a 48 y.o. female.   Patient presents today for 2-week history of congested cough, coughing up thick, yellow mucus, shortness of breath, wheezing worse at nighttime, bilateral back pain when she takes a deep breath, stuffy nose, postnasal drainage, sinus pressure and headache.  She denies fever, body aches or chills, chest pain, runny nose, abdominal pain, nausea/vomiting, and diarrhea.  No change in appetite.  Has been taking previously prescribed inhalers including Combivent, Ventolin, fluticasone, and Symbicort for symptoms which she takes daily for COPD.  Has also been taking Mucinex which helps to break up the congestion.  Patient is also requesting refill of Lantus 50 units nightly.  Reports she is in between primary care providers and has an appointment coming up in March with her new one.  She reports since she has been sick, her blood sugars have been elevated around 300.  She typically checks her blood sugar 3 times daily.  She denies vomiting, has been able to keep food and fluids down.  She also takes aspart based on a sliding scale for her blood sugar during the day.    Past Medical History:  Diagnosis Date   ADHD    Agoraphobia    Anxiety    Asthma    Bipolar disorder (HCC)    Concussion    DDD (degenerative disc disease), lumbar    Fibromyalgia    GERD (gastroesophageal reflux disease)    History of pneumonia 2015   History of substance abuse (HCC)    Hypertension    Migraine    Mixed hyperlipidemia    Morbid obesity (HCC)    Neuropathy    OSA (obstructive sleep apnea)    Plantar fascial fibromatosis    Spondylosis    Vitamin D deficiency 03/27/2016    Patient Active Problem List   Diagnosis Date Noted   Essential hypertension 01/25/2020   Fatigue 01/25/2020   Hypersomnia 01/25/2020   Morbid  obesity (HCC) 01/25/2020   Obstructive sleep apnea of adult 01/25/2020   GERD (gastroesophageal reflux disease)    Esophageal dysphagia    Abdominal pain, epigastric    Constipation    Bipolar 1 disorder, mixed, moderate (HCC)    Bipolar 1 disorder, depressed (HCC) 05/24/2017   Vitamin D deficiency 03/27/2016   Dyslipidemia (high LDL; low HDL) 03/27/2016   Elevated serum creatinine 03/27/2016   Cervical disc disorder with radiculopathy of cervical region 12/20/2014    Past Surgical History:  Procedure Laterality Date   CYST REMOVAL HAND Left    ESOPHAGEAL DILATION     ESOPHAGOGASTRODUODENOSCOPY  2006   Dr. Rehman:small hh, esophageal dilation.   ESOPHAGOGASTRODUODENOSCOPY (EGD) WITH PROPOFOL N/A 05/15/2019   Dr. Lyna Poser: Esophagitis with circumferential erosions or excoriations involving 10 cm segment of mid esophagus with overlying exudate.  Most proximal and distal esophagus appeared normal.  Biopsies revealed inflammation only.  Esophagus dilated.   FOOT SURGERY Right    'knot removed"   MALONEY DILATION N/A 05/15/2019   Procedure: MALONEY DILATION;  Surgeon: Corbin Ade, MD;  Location: [facility];  Service: Endoscopy;  Laterality: N/A;   MULTIPLE EXTRACTIONS WITH ALVEOLOPLASTY N/A 05/29/2016   Procedure: MULTIPLE EXTRACTIONS;  Surgeon: Ocie Doyne, DDS;  Location: [facility] OR;  Service: Oral Surgery;  Laterality: N/A;   WISDOM TOOTH EXTRACTION  OB History     Gravida  3   Para  1   Term  1   Preterm      AB  2   Living  1      SAB  2   IAB      Ectopic      Multiple      Live Births               Home Medications    Prior to Admission medications   Medication Sig Start Date End Date Taking? Authorizing Provider  benzonatate (TESSALON) 100 MG capsule Take 1 capsule (100 mg total) by mouth 3 (three) times daily as needed for cough. Do not take with alcohol or while operating or driving heavy machinery 1/61/09  Yes Cathlean Marseilles A, NP   JARDIANCE 25 MG TABS tablet Take 25 mg by mouth daily. 08/03/23  Yes [provider]  albuterol (VENTOLIN HFA) 108 (90 Base) MCG/ACT inhaler Inhale 2 puffs into the lungs every 4 (four) hours as needed for wheezing or shortness of breath.    [provider]  atorvastatin (LIPITOR) 20 MG tablet Take 20 mg by mouth every evening.     [provider]  busPIRone (BUSPAR) 15 MG tablet Take 15 mg by mouth 3 (three) times daily. 09/09/22   [provider]  COMBIVENT RESPIMAT 20-100 MCG/ACT AERS respimat Inhale 1 puff into the lungs 4 (four) times daily. 07/17/21   [provider]  Cyanocobalamin (VITAMIN B-12) 2000 MCG TBCR Take 1 tablet by mouth daily.    [provider]  doxycycline (VIBRAMYCIN) 100 MG capsule Take 1 capsule (100 mg total) by mouth 2 (two) times daily for 7 days. 08/05/23 08/12/23  Valentino Nose, NP  fluconazole (DIFLUCAN) 100 MG tablet Take 100 mg by mouth daily. Start on 08/26/22 for 10 days 08/26/22   [provider]  fluticasone (FLOVENT HFA) 220 MCG/ACT inhaler Inhale 1 puff into the lungs 2 (two) times daily.    [provider]  furosemide (LASIX) 20 MG tablet Take 20 mg by mouth daily. 10/09/21   [provider]  gabapentin (NEURONTIN) 600 MG tablet Take 600 mg by mouth 3 (three) times daily. 09/09/22   [provider]  hydrochlorothiazide (HYDRODIURIL) 25 MG tablet Take 25 mg by mouth daily.    [provider]  HYDROcodone-acetaminophen (NORCO) 10-325 MG tablet Take 1 tablet by mouth daily as needed. 09/04/21   [provider]  hydrOXYzine (ATARAX) 25 MG tablet Take 25 mg by mouth 2 (two) times daily. 09/09/22   [provider]  insulin aspart (NOVOLOG FLEXPEN) 100 UNIT/ML FlexPen Use sliding scale until you follow up with your PCP and establish a regimen for meals. 09/11/22   Honor Loh M, PA-C  insulin glargine (LANTUS SOLOSTAR) 100 UNIT/ML Solostar Pen Inject 50  Units into the skin at bedtime. 08/05/23   Valentino Nose, NP  Insulin Pen Needle 32G X 4 MM MISC 1 Needle by Does not apply route as needed. 09/11/22   Teressa Lower, PA-C  lamoTRIgine (LAMICTAL) 100 MG tablet Take 100 mg by mouth 2 (two) times daily.    [provider]  medroxyPROGESTERone (PROVERA) 10 MG tablet TAKE 1 TABLET BY MOUTH DAILY FOR THE first 10 DAYS of each calender MONTH 10/26/22   Lazaro Arms, MD  methocarbamol (ROBAXIN) 750 MG tablet Take 750 mg by mouth 4 (four) times daily as needed for muscle spasms.  [provider]  omeprazole (PRILOSEC) 40 MG capsule Take 40 mg by mouth 2 (two) times daily.    [provider]  ondansetron (ZOFRAN) 4 MG tablet Take 4 mg by mouth 3 (three) times daily as needed for nausea or vomiting. Patient not taking: Reported on 09/11/2022    [provider]  ondansetron (ZOFRAN) 4 MG tablet Take 4 mg by mouth every 8 (eight) hours as needed for nausea or vomiting.    [provider]  prazosin (MINIPRESS) 2 MG capsule Take 2 mg by mouth at bedtime.    [provider]  Prenatal Vit-Fe Fumarate-FA (PRENATAL COMPLETE PO) Take 1 tablet by mouth daily.     [provider]  promethazine-dextromethorphan (PROMETHAZINE-DM) 6.25-15 MG/5ML syrup Take 5 mLs by mouth 4 (four) times daily as needed. Do not take with alcohol or while driving or operating heavy machinery.  May cause drowsiness. 08/05/23   Valentino Nose, NP  QUEtiapine (SEROQUEL) 300 MG tablet Take 300 mg by mouth at bedtime.    [provider]  silver sulfADIAZINE (SILVADENE) 1 % cream Use to areas 2-3 times daily as needed 11/13/21   Lazaro Arms, MD  SYMBICORT 160-4.5 MCG/ACT inhaler Inhale 2 puffs into the lungs 2 (two) times daily. 10/09/21   [provider]  topiramate (TOPAMAX) 100 MG tablet Take 100 mg by mouth daily.    [provider]  Vitamin D, Ergocalciferol, (DRISDOL) 1.25 MG (50000 UT) CAPS  capsule Take 50,000 Units by mouth every 7 (seven) days.    [provider]    Family History Family History  Problem Relation Age of Onset   Cancer Maternal Grandmother        lung, back   Diabetes Paternal Grandmother    Heart disease Paternal Grandmother    Diabetes Paternal Grandfather    Heart disease Paternal Grandfather    Stroke Paternal Grandfather     Social History Social History   Tobacco Use   Smoking status: Every Day    Current packs/day: 1.00    Average packs/day: 1 pack/day for 10.0 years (10.0 ttl pk-yrs)    Types: Cigarettes   Smokeless tobacco: Never  Vaping Use   Vaping status: Never Used  Substance Use Topics   Alcohol use: Not Currently   Drug use: Not Currently    Types: Cocaine     Allergies   Penicillins, Sulfa antibiotics, Latex, Nisoldipine, and Other   Review of Systems Review of Systems Per HPI  Physical Exam Triage Vital Signs ED Triage Vitals  Encounter Vitals Group     BP 08/05/23 1925 119/79     Systolic BP Percentile --      Diastolic BP Percentile --      Pulse Rate 08/05/23 1925 78     Resp 08/05/23 1925 20     Temp 08/05/23 1925 98.3 F (36.8 C)     Temp Source 08/05/23 1925 Oral     SpO2 08/05/23 1925 94 %     Weight --      Height --      Head Circumference --      Peak Flow --      Pain Score 08/05/23 1926 5     Pain Loc --      Pain Education --      Exclude from Growth Chart --    No data found.  Updated Vital Signs BP 119/79 (BP Location: Right Arm)   Pulse 78  Temp 98.3 F (36.8 C) (Oral)   Resp 20   SpO2 94%   Visual Acuity Right Eye Distance:   Left Eye Distance:   Bilateral Distance:    Right Eye Near:   Left Eye Near:    Bilateral Near:     Physical Exam Vitals and nursing note reviewed.  Constitutional:      General: She is not in acute distress.    Appearance: Normal appearance. She is not ill-appearing or toxic-appearing.  HENT:     Head: Normocephalic and atraumatic.      Right Ear: Tympanic membrane, ear canal and external ear normal.     Left Ear: Tympanic membrane, ear canal and external ear normal.     Nose: Congestion present. No rhinorrhea.     Mouth/Throat:     Mouth: Mucous membranes are moist.     Pharynx: Oropharynx is clear. No oropharyngeal exudate or posterior oropharyngeal erythema.  Eyes:     General: No scleral icterus.    Extraocular Movements: Extraocular movements intact.  Cardiovascular:     Rate and Rhythm: Normal rate and regular rhythm.  Pulmonary:     Effort: Pulmonary effort is normal. No respiratory distress.     Breath sounds: Wheezing present. No rhonchi or rales.  Musculoskeletal:     Cervical back: Normal range of motion and neck supple.  Lymphadenopathy:     Cervical: No cervical adenopathy.  Skin:    General: Skin is warm and dry.     Coloration: Skin is not jaundiced or pale.     Findings: No erythema or rash.  Neurological:     Mental Status: She is alert and oriented to person, place, and time.  Psychiatric:        Behavior: Behavior is cooperative.      UC Treatments / Results  Labs (all labs ordered are listed, but only abnormal results are displayed) Labs Reviewed - No data to display  EKG   Radiology No results found.  Procedures Procedures (including critical care time)  Medications Ordered in UC Medications - No data to display  Initial Impression / Assessment and Plan / UC Course  I have reviewed the triage vital signs and the nursing notes.  Pertinent labs & imaging results that were available during my care of the patient were reviewed by me and considered in my medical decision making (see chart for details).   Patient is well-appearing, normotensive, afebrile, not tachycardic, not tachypneic, oxygenating well on room air.    1. COPD exacerbation (HCC) Vitals and exam are stable today Continue breathing treatments, start doxycycline, continue Mucinex Will defer oral  corticosteroids at this time due to elevated blood sugars Start cough suppressant medication Strict ER precautions discussed with patient  2. Type 2 diabetes mellitus with hyperglycemia, with long-term current use of insulin (HCC) Continue Lantus 50 units nightly  Follow-up with primary care provider as planned Patient aware of DKA symptoms and when to seek emergent care  The patient was given the opportunity to ask questions.  All questions answered to their satisfaction.  The patient is in agreement to this plan.   Final Clinical Impressions(s) / UC Diagnoses   Final diagnoses:  COPD exacerbation (HCC)  Type 2 diabetes mellitus with hyperglycemia, with long-term current use of insulin [facility])     Discharge Instructions      We are treating you for COPD exacerbation today with doxycycline and cough suppressant medicine.  Continue inhalers and mucinex.  Seek care emergently  if symptoms do not improve in the next couple of days.   Continue Lantus 50 units nightly and keep follow up with PCP in March.     ED Prescriptions     Medication Sig Dispense Auth. Provider   promethazine-dextromethorphan (PROMETHAZINE-DM) 6.25-15 MG/5ML syrup Take 5 mLs by mouth 4 (four) times daily as needed. Do not take with alcohol or while driving or operating heavy machinery.  May cause drowsiness. 100 mL Cathlean Marseilles A, NP   insulin glargine (LANTUS SOLOSTAR) 100 UNIT/ML Solostar Pen Inject 50 Units into the skin at bedtime. 75 mL Cathlean Marseilles A, NP   benzonatate (TESSALON) 100 MG capsule Take 1 capsule (100 mg total) by mouth 3 (three) times daily as needed for cough. Do not take with alcohol or while operating or driving heavy machinery 21 capsule Cathlean Marseilles A, NP   doxycycline (VIBRAMYCIN) 100 MG capsule Take 1 capsule (100 mg total) by mouth 2 (two) times daily for 7 days. 14 capsule Valentino Nose, NP      PDMP not reviewed this encounter.   Valentino Nose,  NP 08/05/23 [phone]

## 2023-09-16 ENCOUNTER — Encounter: Payer: Self-pay | Admitting: Internal Medicine

## 2023-09-16 ENCOUNTER — Ambulatory Visit: Payer: Medicaid Other | Admitting: Internal Medicine

## 2023-09-16 VITALS — BP 108/66 | HR 112 | Ht 66.0 in | Wt 342.6 lb

## 2023-09-16 DIAGNOSIS — J449 Chronic obstructive pulmonary disease, unspecified: Secondary | ICD-10-CM

## 2023-09-16 DIAGNOSIS — E1142 Type 2 diabetes mellitus with diabetic polyneuropathy: Secondary | ICD-10-CM

## 2023-09-16 DIAGNOSIS — F3162 Bipolar disorder, current episode mixed, moderate: Secondary | ICD-10-CM | POA: Diagnosis not present

## 2023-09-16 DIAGNOSIS — E538 Deficiency of other specified B group vitamins: Secondary | ICD-10-CM

## 2023-09-16 DIAGNOSIS — E559 Vitamin D deficiency, unspecified: Secondary | ICD-10-CM

## 2023-09-16 DIAGNOSIS — Z794 Long term (current) use of insulin: Secondary | ICD-10-CM | POA: Diagnosis not present

## 2023-09-16 DIAGNOSIS — M51362 Other intervertebral disc degeneration, lumbar region with discogenic back pain and lower extremity pain: Secondary | ICD-10-CM | POA: Diagnosis not present

## 2023-09-16 DIAGNOSIS — G4733 Obstructive sleep apnea (adult) (pediatric): Secondary | ICD-10-CM | POA: Diagnosis not present

## 2023-09-16 DIAGNOSIS — J309 Allergic rhinitis, unspecified: Secondary | ICD-10-CM

## 2023-09-16 DIAGNOSIS — I10 Essential (primary) hypertension: Secondary | ICD-10-CM

## 2023-09-16 DIAGNOSIS — Z1159 Encounter for screening for other viral diseases: Secondary | ICD-10-CM

## 2023-09-16 DIAGNOSIS — E782 Mixed hyperlipidemia: Secondary | ICD-10-CM | POA: Diagnosis not present

## 2023-09-16 DIAGNOSIS — M5010 Cervical disc disorder with radiculopathy, unspecified cervical region: Secondary | ICD-10-CM | POA: Diagnosis not present

## 2023-09-16 DIAGNOSIS — E1169 Type 2 diabetes mellitus with other specified complication: Secondary | ICD-10-CM | POA: Diagnosis not present

## 2023-09-16 MED ORDER — HYDROCODONE-ACETAMINOPHEN 10-325 MG PO TABS: 1.0000 | ORAL_TABLET | Freq: Every day | ORAL | 0 refills | Status: DC | PRN

## 2023-09-16 MED ORDER — OZEMPIC (0.25 OR 0.5 MG/DOSE) 2 MG/3ML ~~LOC~~ SOPN: PEN_INJECTOR | SUBCUTANEOUS | 1 refills | Status: DC

## 2023-09-16 MED ORDER — FLUTICASONE PROPIONATE 50 MCG/ACT NA SUSP: 2.0000 | Freq: Every day | NASAL | 6 refills | Status: AC

## 2023-09-16 MED ORDER — DEXCOM G7 SENSOR MISC: 5 refills | Status: DC

## 2023-09-16 MED ORDER — COMBIVENT RESPIMAT 20-100 MCG/ACT IN AERS: 1.0000 | INHALATION_SPRAY | Freq: Four times a day (QID) | RESPIRATORY_TRACT | 5 refills | Status: DC | PRN

## 2023-09-16 MED ORDER — DEXCOM G7 RECEIVER DEVI
0 refills | Status: AC
Start: 2023-09-16 — End: ?

## 2023-09-16 NOTE — Assessment & Plan Note (Signed)
On gabapentin 600 mg 3 times daily

## 2023-09-16 NOTE — Assessment & Plan Note (Signed)
 Has chronic fatigue, hypersomnolence and dyspnea-likely due to OSA Will refer to pulmonology

## 2023-09-16 NOTE — Assessment & Plan Note (Signed)
 Well controlled with Flonase, refilled

## 2023-09-16 NOTE — Assessment & Plan Note (Addendum)
 Has chronic neck pain with radicular symptoms to bilateral UE Takes gabapentin 600 mg 3 times daily Has Norco as needed for severe pain, refilled

## 2023-09-16 NOTE — Progress Notes (Signed)
 New Patient Office Visit  Subjective:  Patient ID: [name], female    DOB: [birth year]  Age: 48 y.o. MRN: [id number]  CC:  Chief Complaint  Patient presents with   Establish Care    HPI [name] is a 48 y.o. female with past medical history of HTN, type II DM, OSA, COPD, DDD of cervical and lumbar spine, bipolar disorder and morbid obesity who presents for establishing care.  HTN: Her BP is WNL.  She takes HCTZ 25 mg QD.  She denies any headache, dizziness, chest pain or palpitations currently.  She has chronic dyspnea, likely due to COPD and OSA.  Type II DM with neuropathy: She has history of uncontrolled type II DM.  She takes Jardiance 25 mg QD and Lantus 50 units nightly with ISS.  Her blood glucose runs around 200 mostly.  Denies any recent episode of hypoglycemia.  She has chronic fatigue, but denies polyuria or polydipsia.  She also reports chronic, intermittent numbness of bilateral LE-takes gabapentin 600 mg 3 times daily.  COPD: She smokes about 1.5 pack/day, but has been trying to cut down.  She has chronic, intermittent dyspnea.  She uses Symbicort, Flovent and as needed albuterol.  She also has Combivent for dyspnea or wheezing.  OSA: She had sleep study done in 2021, with Dr Gerilyn Pilgrim. She was not able to get CPAP after it. She has not seen any sleep  specialist since then.  She reports chronic fatigue, hypersomnolence and dyspnea.  DDD of cervical and lumbar spine: She has chronic neck and back pain with radicular symptoms to UE and LE respectively.  She uses a rolling walker for walking support.  She takes Norco as needed for severe pain, has about 30 tablets in 3 months.  Bipolar disorder: Followed by psychiatry in [place].  She is on Vraylar 1.5 mg QD, Seroquel 300 mg nightly, prazosin 2 mg nightly, Lamictal 100 mg twice daily and BuSpar 15 mg 3 times daily currently.    Past Medical History:  Diagnosis Date   ADHD    Agoraphobia    Anxiety     Asthma    Bipolar disorder (HCC)    Concussion    DDD (degenerative disc disease), lumbar    Fibromyalgia    GERD (gastroesophageal reflux disease)    History of pneumonia 2015   History of substance abuse (HCC)    Hypertension    Migraine    Mixed hyperlipidemia    Morbid obesity (HCC)    Neuropathy    OSA (obstructive sleep apnea)    Plantar fascial fibromatosis    Spondylosis    Vitamin D deficiency 03/27/2016    Past Surgical History:  Procedure Laterality Date   CYST REMOVAL HAND Left    ESOPHAGEAL DILATION     ESOPHAGOGASTRODUODENOSCOPY  2006   Dr. Rehman:small hh, esophageal dilation.   ESOPHAGOGASTRODUODENOSCOPY (EGD) WITH PROPOFOL N/A 05/15/2019   Dr. Lyna Poser: Esophagitis with circumferential erosions or excoriations involving 10 cm segment of mid esophagus with overlying exudate.  Most proximal and distal esophagus appeared normal.  Biopsies revealed inflammation only.  Esophagus dilated.   FOOT SURGERY Right    'knot removed"   MALONEY DILATION N/A 05/15/2019   Procedure: MALONEY DILATION;  Surgeon: Corbin Ade, MD;  Location: [facility];  Service: Endoscopy;  Laterality: N/A;   MULTIPLE EXTRACTIONS WITH ALVEOLOPLASTY N/A 05/29/2016   Procedure: MULTIPLE EXTRACTIONS;  Surgeon: Ocie Doyne, DDS;  Location: [facility] OR;  Service: Oral Surgery;  Laterality: N/A;   WISDOM TOOTH EXTRACTION      Family History  Problem Relation Age of Onset   Cancer Maternal Grandmother        lung, back   Diabetes Paternal Grandmother    Heart disease Paternal Grandmother    Diabetes Paternal Grandfather    Heart disease Paternal Grandfather    Stroke Paternal Grandfather     Social History   Socioeconomic History   Marital status: Single    Spouse name: Not on file   Number of children: Not on file   Years of education: Not on file   Highest education level: Associate degree: academic program  Occupational History   Not on file  Tobacco Use   Smoking status: Every Day     Current packs/day: 1.00    Average packs/day: 1 pack/day for 10.0 years (10.0 ttl pk-yrs)    Types: Cigarettes   Smokeless tobacco: Never  Vaping Use   Vaping status: Never Used  Substance and Sexual Activity   Alcohol use: Not Currently   Drug use: Not Currently    Types: Cocaine   Sexual activity: Not Currently    Partners: Male    Birth control/protection: None, Abstinence  Other Topics Concern   Not on file  Social History Narrative   Not on file   Social Drivers of Health   Financial Resource Strain: Low Risk  (09/15/2023)   Overall Financial Resource Strain (CARDIA)    Difficulty of Paying Living Expenses: Not hard at all  Food Insecurity: No Food Insecurity (09/15/2023)   Hunger Vital Sign    Worried About Running Out of Food in the Last Year: Never true    Ran Out of Food in the Last Year: Never true  Transportation Needs: No Transportation Needs (09/15/2023)   PRAPARE - Administrator, Civil Service (Medical): No    Lack of Transportation (Non-Medical): No  Physical Activity: Unknown (09/15/2023)   Exercise Vital Sign    Days of Exercise per Week: 0 days    Minutes of Exercise per Session: Not on file  Stress: No Stress Concern Present (09/15/2023)   [employer] of Occupational Health - Occupational Stress Questionnaire    Feeling of Stress : Only a little  Social Connections: Socially Isolated (09/15/2023)   Social Connection and Isolation Panel [NHANES]    Frequency of Communication with Friends and Family: Once a week    Frequency of Social Gatherings with Friends and Family: Never    Attends Religious Services: Never    Database administrator or Organizations: No    Attends Engineer, structural: Not on file    Marital Status: Never married  Intimate Partner Violence: Not At Risk (11/13/2021)   Humiliation, Afraid, Rape, and Kick questionnaire    Fear of Current or Ex-Partner: No    Emotionally Abused: No    Physically Abused: No    Sexually  Abused: No    ROS Review of Systems  Constitutional:  Positive for fatigue. Negative for chills and fever.  HENT:  Positive for congestion. Negative for sore throat.   Eyes:  Negative for pain and discharge.  Respiratory:  Positive for shortness of breath (Intermittent). Negative for cough.   Cardiovascular:  Negative for chest pain and palpitations.  Gastrointestinal:  Negative for abdominal pain, diarrhea, nausea and vomiting.  Endocrine: Negative for polydipsia and polyuria.  Genitourinary:  Negative for dysuria and hematuria.  Musculoskeletal:  Positive for arthralgias, back pain and  neck pain. Negative for neck stiffness.  Skin:  Negative for rash.  Neurological:  Negative for dizziness and weakness.  Psychiatric/Behavioral:  Positive for sleep disturbance. Negative for agitation and behavioral problems. The patient is nervous/anxious.     Objective:   Today's Vitals: BP 108/66 (BP Location: Left Arm, Patient Position: Sitting, Cuff Size: Large)   Pulse (!) 112   Ht 5\' 6"  (1.676 m)   Wt (!) 342 lb 9.6 oz (155.4 kg)   SpO2 94%   BMI 55.30 kg/m   Physical Exam Vitals reviewed.  Constitutional:      General: She is not in acute distress.    Appearance: She is obese. She is not diaphoretic.     Comments: Has a rolling walker  HENT:     Head: Normocephalic and atraumatic.     Nose: Nose normal.     Mouth/Throat:     Mouth: Mucous membranes are moist.  Eyes:     General: No scleral icterus.    Extraocular Movements: Extraocular movements intact.  Cardiovascular:     Rate and Rhythm: Normal rate and regular rhythm.     Heart sounds: Normal heart sounds. No murmur heard. Pulmonary:     Breath sounds: Normal breath sounds. No wheezing or rales.  Musculoskeletal:     Cervical back: Neck supple. No tenderness.     Right lower leg: No edema.     Left lower leg: No edema.  Skin:    General: Skin is warm.     Findings: No rash.  Neurological:     General: No focal  deficit present.     Mental Status: She is alert and oriented to person, place, and time.     Sensory: Sensory deficit (B/l LE) present.     Motor: Weakness (B/l LE - 4/5) present.  Psychiatric:        Mood and Affect: Mood normal.        Behavior: Behavior normal.     Assessment & Plan:   Problem List Items Addressed This Visit       Cardiovascular and Mediastinum   Essential hypertension   BP Readings from Last 1 Encounters:  09/16/23 108/66   Well-controlled with hydrochlorothiazide 25 mg once daily Has Lasix 20 mg as needed for leg swelling Counseled for compliance with the medications Advised DASH diet and moderate exercise/walking as tolerated       Relevant Orders   TSH     Respiratory   Obstructive sleep apnea of adult   Has chronic fatigue, hypersomnolence and dyspnea-likely due to OSA Will refer to pulmonology      Relevant Orders   Ambulatory referral to Pulmonology   Chronic obstructive pulmonary disease (HCC)   Overall well-controlled with Symbicort and as needed Combivent Discontinue Flovent as she already has Symbicort If worsening of her symptoms, will switch to [employer] Needs to cut down -> quit smoking      Relevant Medications   COMBIVENT RESPIMAT 20-100 MCG/ACT AERS respimat   fluticasone (FLONASE) 50 MCG/ACT nasal spray   Allergic sinusitis   Well controlled with Flonase, refilled      Relevant Medications   fluticasone (FLONASE) 50 MCG/ACT nasal spray     Endocrine   Type 2 diabetes mellitus with other specified complication (HCC) - Primary    Uncontrolled currently considering her glycemic profile, blood glucose ranging in 200s Associated with HTN, HLD and neuropathy On Lantus 50 units nightly with ISS (150-200: 4 U, 201-250: 6 U, 251-300:  8 U, 301-350: 10 U, 351-400: 12 U, 400 and above: 14 U and call) On Jardiance 25 mg once daily Metformin was not considered due to her declining kidney function and other psychiatric medication  interactions in the past  Added Ozempic -plan to increase dose as tolerated, will try to decrease insulin doses as she responds to [employer] blood glucose 3 times daily before meals and at bedtime, would benefit from CGM for better glycemic profile and regular insulin dosages - sent prescription of Dexcom G7 Advised to follow diabetic diet On statin F/u CMP and lipid panel Diabetic eye exam: Advised to follow up with Ophthalmology for diabetic eye exam  On gabapentin 600 mg 3 times daily for DM neuropathy and chronic neck, back pain      Relevant Medications   Continuous Glucose Sensor (DEXCOM G7 SENSOR) MISC   Continuous Glucose Receiver (DEXCOM G7 RECEIVER) DEVI   Semaglutide,0.25 or 0.5MG /DOS, (OZEMPIC, 0.25 OR 0.5 MG/DOSE,) 2 MG/3ML SOPN   Other Relevant Orders   CMP14+EGFR   Hemoglobin A1c   Urine Microalbumin w/creat. ratio   Diabetic polyneuropathy associated with type 2 diabetes mellitus (HCC)   On gabapentin 600 mg 3 times daily      Relevant Medications   Semaglutide,0.25 or 0.5MG /DOS, (OZEMPIC, 0.25 OR 0.5 MG/DOSE,) 2 MG/3ML SOPN     Nervous and Auditory   Cervical disc disorder with radiculopathy of cervical region   Has chronic neck pain with radicular symptoms to bilateral UE Takes gabapentin 600 mg 3 times daily Has Norco as needed for severe pain, refilled      Relevant Medications   HYDROcodone-acetaminophen (NORCO) 10-325 MG tablet     Musculoskeletal and Integument   Degeneration of intervertebral disc of lumbar region with discogenic back pain and lower extremity pain   Has chronic back pain with radicular symptoms to bilateral LE Takes gabapentin 600 mg 3 times daily Has Norco as needed for severe pain, refilled      Relevant Medications   HYDROcodone-acetaminophen (NORCO) 10-325 MG tablet     Other   Vitamin D deficiency   Relevant Orders   Vitamin D (25 hydroxy)   Bipolar 1 disorder, mixed, moderate (HCC)   Overall well controlled with  Vraylar 1.5 mg once daily, Seroquel 300 mg nightly, Lamictal 100 mg BID On prazosin 2 mg nightly, hydroxyzine 25 mg twice daily and BuSpar 15 mg 3 times daily for anxiety Followed by psychiatry - Dr [facility]annifer [facility] [place]      Relevant Orders   CBC with Differential/Platelet   Morbid obesity (HCC)   BMI Readings from Last 3 Encounters:  09/16/23 55.30 kg/m  09/11/22 56.33 kg/m  02/03/22 55.68 kg/m   Advised to follow low-carb diet and ambulate as tolerated Added Ozempic for type II DM      Relevant Medications   Semaglutide,0.25 or 0.5MG /DOS, (OZEMPIC, 0.25 OR 0.5 MG/DOSE,) 2 MG/3ML SOPN   Mixed hyperlipidemia   On atorvastatin 20 mg once daily Check lipid profile      Relevant Orders   Lipid Profile   Other Visit Diagnoses       B12 deficiency       Relevant Orders   B12     Need for hepatitis C screening test       Relevant Orders   Hepatitis C Antibody       Outpatient Encounter Medications as of 09/16/2023  Medication Sig   Continuous Glucose Receiver (DEXCOM G7 RECEIVER) DEVI Use  it to check blood glucose 3 times before meals, at bedtime and as needed.   Continuous Glucose Sensor (DEXCOM G7 SENSOR) MISC Use it to check blood glucose 3 times before meals, at bedtime and as needed.   fluticasone (FLONASE) 50 MCG/ACT nasal spray Place 2 sprays into both nostrils daily.   Semaglutide,0.25 or 0.5MG /DOS, (OZEMPIC, 0.25 OR 0.5 MG/DOSE,) 2 MG/3ML SOPN Inject 0.25 mg into the skin every 7 (seven) days for 28 days, THEN 0.5 mg every 7 (seven) days for 28 days.   albuterol (VENTOLIN HFA) 108 (90 Base) MCG/ACT inhaler Inhale 2 puffs into the lungs every 4 (four) hours as needed for wheezing or shortness of breath.   atorvastatin (LIPITOR) 20 MG tablet Take 20 mg by mouth every evening.    busPIRone (BUSPAR) 15 MG tablet Take 15 mg by mouth 3 (three) times daily.   COMBIVENT RESPIMAT 20-100 MCG/ACT AERS respimat Inhale 1 puff into the lungs  every 6 (six) hours as needed for wheezing.   Cyanocobalamin (VITAMIN B-12) 2000 MCG TBCR Take 1 tablet by mouth daily.   furosemide (LASIX) 20 MG tablet Take 20 mg by mouth daily.   gabapentin (NEURONTIN) 600 MG tablet Take 600 mg by mouth 3 (three) times daily.   hydrochlorothiazide (HYDRODIURIL) 25 MG tablet Take 25 mg by mouth daily.   HYDROcodone-acetaminophen (NORCO) 10-325 MG tablet Take 1 tablet by mouth daily as needed.   hydrOXYzine (ATARAX) 25 MG tablet Take 25 mg by mouth 2 (two) times daily.   insulin aspart (NOVOLOG FLEXPEN) 100 UNIT/ML FlexPen Use sliding scale until you follow up with your PCP and establish a regimen for meals.   insulin glargine (LANTUS SOLOSTAR) 100 UNIT/ML Solostar Pen Inject 50 Units into the skin at bedtime.   Insulin Pen Needle 32G X 4 MM MISC 1 Needle by Does not apply route as needed.   JARDIANCE 25 MG TABS tablet Take 25 mg by mouth daily.   lamoTRIgine (LAMICTAL) 100 MG tablet Take 100 mg by mouth 2 (two) times daily.   medroxyPROGESTERone (PROVERA) 10 MG tablet TAKE 1 TABLET BY MOUTH DAILY FOR THE first 10 DAYS of each calender MONTH   methocarbamol (ROBAXIN) 750 MG tablet Take 750 mg by mouth 4 (four) times daily as needed for muscle spasms.    omeprazole (PRILOSEC) 40 MG capsule Take 40 mg by mouth 2 (two) times daily.   ondansetron (ZOFRAN) 4 MG tablet Take 4 mg by mouth 3 (three) times daily as needed for nausea or vomiting. (Patient not taking: Reported on 09/11/2022)   ondansetron (ZOFRAN) 4 MG tablet Take 4 mg by mouth every 8 (eight) hours as needed for nausea or vomiting.   prazosin (MINIPRESS) 2 MG capsule Take 2 mg by mouth at bedtime.   Prenatal Vit-Fe Fumarate-FA (PRENATAL COMPLETE PO) Take 1 tablet by mouth daily.    QUEtiapine (SEROQUEL) 300 MG tablet Take 300 mg by mouth at bedtime.   silver sulfADIAZINE (SILVADENE) 1 % cream Use to areas 2-3 times daily as needed   SYMBICORT 160-4.5 MCG/ACT inhaler Inhale 2 puffs into the lungs 2 (two)  times daily.   topiramate (TOPAMAX) 100 MG tablet Take 100 mg by mouth daily.   Vitamin D, Ergocalciferol, (DRISDOL) 1.25 MG (50000 UT) CAPS capsule Take 50,000 Units by mouth every 7 (seven) days.   [DISCONTINUED] benzonatate (TESSALON) 100 MG capsule Take 1 capsule (100 mg total) by mouth 3 (three) times daily as needed for cough. Do not take with alcohol or while  operating or driving heavy machinery   [DISCONTINUED] COMBIVENT RESPIMAT 20-100 MCG/ACT AERS respimat Inhale 1 puff into the lungs 4 (four) times daily.   [DISCONTINUED] fluconazole (DIFLUCAN) 100 MG tablet Take 100 mg by mouth daily. Start on 08/26/22 for 10 days   [DISCONTINUED] fluticasone (FLOVENT HFA) 220 MCG/ACT inhaler Inhale 1 puff into the lungs 2 (two) times daily.   [DISCONTINUED] HYDROcodone-acetaminophen (NORCO) 10-325 MG tablet Take 1 tablet by mouth daily as needed.   [DISCONTINUED] promethazine-dextromethorphan (PROMETHAZINE-DM) 6.25-15 MG/5ML syrup Take 5 mLs by mouth 4 (four) times daily as needed. Do not take with alcohol or while driving or operating heavy machinery.  May cause drowsiness.   No facility-administered encounter medications on file as of 09/16/2023.    Follow-up: Return in about 4 weeks (around 10/14/2023) for DM.   Anabel Halon, MD

## 2023-09-16 NOTE — Assessment & Plan Note (Signed)
 BMI Readings from Last 3 Encounters:  09/16/23 55.30 kg/m  09/11/22 56.33 kg/m  02/03/22 55.68 kg/m   Advised to follow low-carb diet and ambulate as tolerated Added Ozempic for type II DM

## 2023-09-16 NOTE — Assessment & Plan Note (Signed)
 Has chronic back pain with radicular symptoms to bilateral LE Takes gabapentin 600 mg 3 times daily Has Norco as needed for severe pain, refilled

## 2023-09-16 NOTE — Assessment & Plan Note (Signed)
 On atorvastatin 20 mg once daily Check lipid profile

## 2023-09-16 NOTE — Assessment & Plan Note (Addendum)
  Uncontrolled currently considering her glycemic profile, blood glucose ranging in 200s Associated with HTN, HLD and neuropathy On Lantus 50 units nightly with ISS (150-200: 4 U, 201-250: 6 U, 251-300: 8 U, 301-350: 10 U, 351-400: 12 U, 400 and above: 14 U and call) On Jardiance 25 mg once daily Metformin was not considered due to her declining kidney function and other psychiatric medication interactions in the past  Added Ozempic -plan to increase dose as tolerated, will try to decrease insulin doses as she responds to [employer] blood glucose 3 times daily before meals and at bedtime, would benefit from CGM for better glycemic profile and regular insulin dosages - sent prescription of Dexcom G7 Advised to follow diabetic diet On statin F/u CMP and lipid panel Diabetic eye exam: Advised to follow up with Ophthalmology for diabetic eye exam  On gabapentin 600 mg 3 times daily for DM neuropathy and chronic neck, back pain

## 2023-09-16 NOTE — Assessment & Plan Note (Addendum)
 Overall well controlled with Vraylar 1.5 mg once daily, Seroquel 300 mg nightly, Lamictal 100 mg BID On prazosin 2 mg nightly, hydroxyzine 25 mg twice daily and BuSpar 15 mg 3 times daily for anxiety Followed by psychiatry - Dr Jannifer Franklin [facility] in [place]

## 2023-09-16 NOTE — Assessment & Plan Note (Addendum)
 BP Readings from Last 1 Encounters:  09/16/23 108/66   Well-controlled with hydrochlorothiazide 25 mg once daily Has Lasix 20 mg as needed for leg swelling Counseled for compliance with the medications Advised DASH diet and moderate exercise/walking as tolerated

## 2023-09-16 NOTE — Assessment & Plan Note (Addendum)
 Overall well-controlled with Symbicort and as needed Combivent Discontinue Flovent as she already has Symbicort If worsening of her symptoms, will switch to [facility] Needs to cut down -> quit smoking

## 2023-09-16 NOTE — Patient Instructions (Signed)
 Please start taking Ozempic 0.25 mg once weekly. Continue taking Lantus 50 Units and insulin Aspart as per sliding scale.  Please continue to take medications as prescribed.  Please continue to follow low carb diet and ambulate as tolerated.  Please get fasting blood tests done within a week.

## 2023-09-17 ENCOUNTER — Telehealth: Payer: Self-pay | Admitting: Pharmacy Technician

## 2023-09-17 ENCOUNTER — Other Ambulatory Visit (HOSPITAL_COMMUNITY): Payer: Self-pay

## 2023-09-17 NOTE — Telephone Encounter (Signed)
 Pharmacy Patient Advocate Encounter   Received notification from Patient Pharmacy that prior authorization for [facility] G7 SENSORS is required/requested.   Insurance verification completed.   The patient is insured through [facility] .   Per test claim: PA required; PA submitted to above mentioned insurance via CoverMyMeds Key/confirmation #/EOC [id number] Status is pending

## 2023-09-17 NOTE — Telephone Encounter (Signed)
 Pharmacy Patient Advocate Encounter   Received notification from Patient Pharmacy that prior authorization for [facility] 0.25MG  OR 0.5MG /DOSE is required/requested.   Insurance verification completed.   The patient is insured through [facility] .   Per test claim: PA required; PA submitted to above mentioned insurance via CoverMyMeds Key/confirmation #/EOC [id number] Status is pending

## 2023-09-17 NOTE — Telephone Encounter (Signed)
 Pharmacy Patient Advocate Encounter  Received notification from [facility] that Prior Authorization for [facility] 0.25MG  OR 0.5MG /DOSE has been APPROVED from 09/17/2023 to 09/16/2024. Ran test claim, Copay is $4.00. This test claim was processed through [facility]- copay amounts may vary at other pharmacies due to pharmacy/plan contracts, or as the patient moves through the different stages of their insurance plan.   PA #/Case ID/Reference #: Key: [id number]

## 2023-09-17 NOTE — Telephone Encounter (Signed)
 Pharmacy Patient Advocate Encounter   Received notification from Patient Pharmacy that prior authorization for [facility] G7 RECEIVER DEVICE is required/requested.   Insurance verification completed.   The patient is insured through [facility] .   Per test claim: PA required; PA submitted to above mentioned insurance via CoverMyMeds Key/confirmation #/EOC [id number] Status is pending

## 2023-09-17 NOTE — Telephone Encounter (Signed)
 Pharmacy Patient Advocate Encounter  Received notification from [facility] that Prior Authorization for [facility] G7 RECEIVER DEVICE has been APPROVED from 09/17/2023 to 09/16/2024. Spoke to pharmacy to process.Copay is $4.00.    PA #/Case ID/Reference #: [id number]

## 2023-09-20 ENCOUNTER — Telehealth: Payer: Self-pay | Admitting: Pharmacy Technician

## 2023-09-20 ENCOUNTER — Other Ambulatory Visit (HOSPITAL_COMMUNITY): Payer: Self-pay

## 2023-09-20 DIAGNOSIS — F3162 Bipolar disorder, current episode mixed, moderate: Secondary | ICD-10-CM | POA: Diagnosis not present

## 2023-09-20 DIAGNOSIS — I10 Essential (primary) hypertension: Secondary | ICD-10-CM | POA: Diagnosis not present

## 2023-09-20 DIAGNOSIS — E782 Mixed hyperlipidemia: Secondary | ICD-10-CM | POA: Diagnosis not present

## 2023-09-20 DIAGNOSIS — E538 Deficiency of other specified B group vitamins: Secondary | ICD-10-CM | POA: Diagnosis not present

## 2023-09-20 DIAGNOSIS — Z1159 Encounter for screening for other viral diseases: Secondary | ICD-10-CM | POA: Diagnosis not present

## 2023-09-20 DIAGNOSIS — E1169 Type 2 diabetes mellitus with other specified complication: Secondary | ICD-10-CM | POA: Diagnosis not present

## 2023-09-20 DIAGNOSIS — Z794 Long term (current) use of insulin: Secondary | ICD-10-CM | POA: Diagnosis not present

## 2023-09-20 DIAGNOSIS — E559 Vitamin D deficiency, unspecified: Secondary | ICD-10-CM | POA: Diagnosis not present

## 2023-09-20 NOTE — Telephone Encounter (Signed)
 Pharmacy Patient Advocate Encounter  Received notification from [facility] that Prior Authorization for [facility] G7 SENSORS has been APPROVED from 09/17/2023 to 03/16/2024. Ran test claim, Copay is $0.00. This test claim was processed through [facility]- copay amounts may vary at other pharmacies due to pharmacy/plan contracts, or as the patient moves through the different stages of their insurance plan.   PA #/Case ID/Reference #: [id number]

## 2023-09-20 NOTE — Telephone Encounter (Signed)
 Pharmacy Patient Advocate Encounter   Received notification from Patient Pharmacy that prior authorization for HYDROCODONE APAP 10/325MG  TABLETS is required/requested.   Insurance verification completed.   The patient is insured through [facility] .   Per test claim: PA required; PA submitted to above mentioned insurance via CoverMyMeds Key/confirmation #/EOC [id number] Status is pending

## 2023-09-21 ENCOUNTER — Other Ambulatory Visit (HOSPITAL_COMMUNITY): Payer: Self-pay

## 2023-09-21 LAB — CBC WITH DIFFERENTIAL/PLATELET
Basophils Absolute: 0 10*3/uL (ref 0.0–0.2)
Basos: 0 %
EOS (ABSOLUTE): 0.2 10*3/uL (ref 0.0–0.4)
Eos: 2 %
Hematocrit: 48 % — ABNORMAL HIGH (ref 34.0–46.6)
Hemoglobin: 16.1 g/dL — ABNORMAL HIGH (ref 11.1–15.9)
Immature Grans (Abs): 0 10*3/uL (ref 0.0–0.1)
Immature Granulocytes: 1 %
Lymphocytes Absolute: 1.8 10*3/uL (ref 0.7–3.1)
Lymphs: 23 %
MCH: 31.8 pg (ref 26.6–33.0)
MCHC: 33.5 g/dL (ref 31.5–35.7)
MCV: 95 fL (ref 79–97)
Monocytes Absolute: 0.5 10*3/uL (ref 0.1–0.9)
Monocytes: 6 %
Neutrophils Absolute: 5.6 10*3/uL (ref 1.4–7.0)
Neutrophils: 68 %
Platelets: 216 10*3/uL (ref 150–450)
RBC: 5.06 x10E6/uL (ref 3.77–5.28)
RDW: 14.8 % (ref 11.7–15.4)
WBC: 8.1 10*3/uL (ref 3.4–10.8)

## 2023-09-21 LAB — CMP14+EGFR
ALT: 14 IU/L (ref 0–32)
AST: 11 IU/L (ref 0–40)
Albumin: 4.4 g/dL (ref 3.9–4.9)
Alkaline Phosphatase: 160 IU/L — ABNORMAL HIGH (ref 44–121)
BUN/Creatinine Ratio: 6 — ABNORMAL LOW (ref 9–23)
BUN: 6 mg/dL (ref 6–24)
Bilirubin Total: 0.2 mg/dL (ref 0.0–1.2)
CO2: 21 mmol/L (ref 20–29)
Calcium: 9.5 mg/dL (ref 8.7–10.2)
Chloride: 104 mmol/L (ref 96–106)
Creatinine, Ser: 1.02 mg/dL — ABNORMAL HIGH (ref 0.57–1.00)
Globulin, Total: 2.6 g/dL (ref 1.5–4.5)
Glucose: 217 mg/dL — ABNORMAL HIGH (ref 70–99)
Potassium: 4.1 mmol/L (ref 3.5–5.2)
Sodium: 140 mmol/L (ref 134–144)
Total Protein: 7 g/dL (ref 6.0–8.5)
eGFR: 69 mL/min/{1.73_m2} (ref >59)

## 2023-09-21 LAB — LIPID PANEL
Chol/HDL Ratio: 5.3 ratio — ABNORMAL HIGH (ref 0.0–4.4)
Cholesterol, Total: 163 mg/dL (ref 100–199)
HDL: 31 mg/dL — ABNORMAL LOW (ref >39)
LDL Chol Calc (NIH): 87 mg/dL (ref 0–99)
Triglycerides: 266 mg/dL — ABNORMAL HIGH (ref 0–149)
VLDL Cholesterol Cal: 45 mg/dL — ABNORMAL HIGH (ref 5–40)

## 2023-09-21 LAB — HEMOGLOBIN A1C
Est. average glucose Bld gHb Est-mCnc: 203 mg/dL
Hgb A1c MFr Bld: 8.7 % — ABNORMAL HIGH (ref 4.8–5.6)

## 2023-09-21 NOTE — Telephone Encounter (Signed)
 Pharmacy Patient Advocate Encounter  Received notification from [facility] that Prior Authorization for HYDROCODONE/APAP 10/325MG  has been APPROVED from 09/21/2023 to 03/23/2024. Ran test claim, Copay is $4.00. This test claim was processed through [facility]- copay amounts may vary at other pharmacies due to pharmacy/plan contracts, or as the patient moves through the different stages of their insurance plan.   PA #/Case ID/Reference #: [id number]

## 2023-09-22 LAB — TSH: TSH: 1.29 u[IU]/mL (ref 0.450–4.500)

## 2023-09-22 LAB — VITAMIN D 25 HYDROXY (VIT D DEFICIENCY, FRACTURES): Vit D, 25-Hydroxy: 28.3 ng/mL — ABNORMAL LOW (ref 30.0–100.0)

## 2023-09-22 LAB — HEPATITIS C ANTIBODY: Hep C Virus Ab: NONREACTIVE

## 2023-09-22 LAB — VITAMIN B12: Vitamin B-12: 1549 pg/mL — ABNORMAL HIGH (ref 232–1245)

## 2023-09-22 LAB — MICROALBUMIN / CREATININE URINE RATIO
Creatinine, Urine: 66.8 mg/dL
Microalb/Creat Ratio: 13 mg/g{creat} (ref 0–29)
Microalbumin, Urine: 8.7 ug/mL

## 2023-09-23 ENCOUNTER — Other Ambulatory Visit (HOSPITAL_COMMUNITY): Payer: Self-pay

## 2023-09-28 ENCOUNTER — Telehealth: Payer: Self-pay | Admitting: Internal Medicine

## 2023-09-28 NOTE — Telephone Encounter (Signed)
 Copied from CRM [id number]. Topic: General - Other >> Sep 28, 2023  1:57 PM [name] wrote: Reason for CRM: The patient states she brought in forms for in home care for Dr. Allena Katz to fill out previously and is requesting an update to see if they have been faxed or if services have been set up. The patient's call back number is [phone].

## 2023-09-29 NOTE — Telephone Encounter (Signed)
 Tried calling pt to discuss lmtrc.

## 2023-09-30 ENCOUNTER — Telehealth: Payer: Self-pay | Admitting: Internal Medicine

## 2023-09-30 NOTE — Telephone Encounter (Signed)
 [facility] Forms Noted Copied Scanned Original in provider box Copy at front desk

## 2023-09-30 NOTE — Telephone Encounter (Signed)
 Spoke to pt states she will have her sister drop forms off.

## 2023-10-02 DIAGNOSIS — E119 Type 2 diabetes mellitus without complications: Secondary | ICD-10-CM | POA: Diagnosis not present

## 2023-10-02 DIAGNOSIS — H5213 Myopia, bilateral: Secondary | ICD-10-CM | POA: Diagnosis not present

## 2023-10-02 LAB — HM DIABETES EYE EXAM

## 2023-10-07 DIAGNOSIS — H5213 Myopia, bilateral: Secondary | ICD-10-CM | POA: Diagnosis not present

## 2023-10-08 NOTE — Telephone Encounter (Signed)
 Patient states she needs a prior authorization for home health services, call was unclear regarding the process.

## 2023-10-11 NOTE — Telephone Encounter (Signed)
 Spoke to [place] let her know she will need to contact whomever discussed this with her, we dont do prior authorizations for home health orders. Verbalized understanding.

## 2023-10-25 DIAGNOSIS — F411 Generalized anxiety disorder: Secondary | ICD-10-CM | POA: Diagnosis not present

## 2023-10-25 DIAGNOSIS — F4310 Post-traumatic stress disorder, unspecified: Secondary | ICD-10-CM | POA: Diagnosis not present

## 2023-10-25 DIAGNOSIS — F3181 Bipolar II disorder: Secondary | ICD-10-CM | POA: Diagnosis not present

## 2023-10-25 DIAGNOSIS — F4011 Social phobia, generalized: Secondary | ICD-10-CM | POA: Diagnosis not present

## 2023-11-06 ENCOUNTER — Other Ambulatory Visit: Payer: Self-pay | Admitting: Internal Medicine

## 2023-11-06 DIAGNOSIS — Z794 Long term (current) use of insulin: Secondary | ICD-10-CM

## 2023-11-18 ENCOUNTER — Other Ambulatory Visit: Payer: Self-pay | Admitting: Internal Medicine

## 2023-11-22 ENCOUNTER — Encounter: Payer: Self-pay | Admitting: Internal Medicine

## 2023-11-22 ENCOUNTER — Other Ambulatory Visit: Payer: Self-pay | Admitting: Internal Medicine

## 2023-11-22 ENCOUNTER — Ambulatory Visit: Admitting: Internal Medicine

## 2023-11-22 VITALS — BP 127/81 | HR 68 | Ht 66.0 in | Wt 338.4 lb

## 2023-11-22 DIAGNOSIS — E1169 Type 2 diabetes mellitus with other specified complication: Secondary | ICD-10-CM

## 2023-11-22 DIAGNOSIS — E1142 Type 2 diabetes mellitus with diabetic polyneuropathy: Secondary | ICD-10-CM | POA: Diagnosis not present

## 2023-11-22 DIAGNOSIS — M79672 Pain in left foot: Secondary | ICD-10-CM

## 2023-11-22 DIAGNOSIS — I10 Essential (primary) hypertension: Secondary | ICD-10-CM | POA: Diagnosis not present

## 2023-11-22 DIAGNOSIS — F3162 Bipolar disorder, current episode mixed, moderate: Secondary | ICD-10-CM | POA: Diagnosis not present

## 2023-11-22 DIAGNOSIS — M5010 Cervical disc disorder with radiculopathy, unspecified cervical region: Secondary | ICD-10-CM

## 2023-11-22 DIAGNOSIS — Z794 Long term (current) use of insulin: Secondary | ICD-10-CM

## 2023-11-22 DIAGNOSIS — Z1211 Encounter for screening for malignant neoplasm of colon: Secondary | ICD-10-CM | POA: Diagnosis not present

## 2023-11-22 MED ORDER — SEMAGLUTIDE (1 MG/DOSE) 4 MG/3ML ~~LOC~~ SOPN: 1.0000 mg | PEN_INJECTOR | SUBCUTANEOUS | 3 refills | Status: DC

## 2023-11-22 NOTE — Assessment & Plan Note (Signed)
 Has chronic neck pain with radicular symptoms to bilateral UE Takes gabapentin 600 mg 3 times daily Has Norco as needed for severe pain, refilled

## 2023-11-22 NOTE — Assessment & Plan Note (Signed)
 BP Readings from Last 1 Encounters:  11/22/23 127/81   Well-controlled with hydrochlorothiazide  25 mg once daily Has Lasix 20 mg as needed for leg swelling Counseled for compliance with the medications Advised DASH diet and moderate exercise/walking as tolerated

## 2023-11-22 NOTE — Assessment & Plan Note (Signed)
 Overall well controlled with Vraylar 1.5 mg once daily, Seroquel 300 mg nightly, Lamictal 100 mg BID On prazosin 2 mg nightly, hydroxyzine 25 mg twice daily and BuSpar 15 mg 3 times daily for anxiety Followed by psychiatry - [facility]annifer [facility] [place]

## 2023-11-22 NOTE — Patient Instructions (Addendum)
 Please start taking Ozempic  1 mg dose after completing 0.5 mg doses. Please decrease dose of Lantus  to 40 U once you start Ozempic  1 mg dose.  Please continue to take medications as prescribed.  Please continue to follow low carb diet and perform moderate exercise/walking at least 150 mins/week.

## 2023-11-22 NOTE — Progress Notes (Unsigned)
 Established Patient Office Visit  Subjective:  Patient ID: [name], female    DOB: 04/03/1976  Age: 48 y.o. MRN: [id number]  CC:  Chief Complaint  Patient presents with   Care Management    Four week follow up     Referral    Referral to specialist about left foot being numb and painful     HPI [name] is a 48 y.o. female with past medical history of HTN, type II DM, OSA, COPD, DDD of cervical and lumbar spine, bipolar disorder and morbid obesity who presents for f/u of her chronic medical conditions.  HTN: Her BP is WNL.  She takes HCTZ 25 mg QD.  She denies any headache, dizziness, chest pain or palpitations currently.  She has chronic dyspnea, likely due to COPD and OSA.  Type II DM with neuropathy: She has history of uncontrolled type II DM.  She takes Jardiance 25 mg QD and Lantus  50 units nightly with ISS.  She has started taking Ozempic  0.5 mg QW since the last visit, and has been tolerating it well.  She has not needed mealtime insulin  since starting Ozempic .  She has CGM now.  Her blood glucose averages are as follows:  7-day: 147 30-day: 148 90 days: 158  Denies any recent episode of hypoglycemia.  She has chronic fatigue, but denies polyuria or polydipsia.  She also reports chronic, intermittent numbness of bilateral LE-takes gabapentin  600 mg 3 times daily.  COPD: She smokes about 1.5 pack/day, but has been trying to cut down.  She has chronic, intermittent dyspnea.  She uses Symbicort, Flovent  and as needed albuterol .  She also has Combivent  for dyspnea or wheezing.  OSA: She had sleep study done in 2021, with Dr Joleen Navy. She was not able to get CPAP after it. She is planned to see sleep  specialist.  She reports chronic fatigue, hypersomnolence and dyspnea.  DDD of cervical and lumbar spine: She has chronic neck and back pain with radicular symptoms to UE and LE respectively.  She uses a rolling walker for walking support.  She takes Norco as needed for  severe pain, has about 30 tablets in 3 months.  Bipolar disorder: Followed by psychiatry in [place].  She is on Vraylar 1.5 mg QD, Seroquel  300 mg nightly, prazosin 2 mg nightly, Lamictal  100 mg twice daily and BuSpar 15 mg 3 times daily currently.  Left foot pain: She reports left foot pain around the 3rd and 4th toes since 10/24/23.  Pain is constant, sharp, worse upon standing or walking.  Denies any recent injury.  Past Medical History:  Diagnosis Date   ADHD    Agoraphobia    Anxiety    Asthma    Bipolar disorder (HCC)    Concussion    DDD (degenerative disc disease), lumbar    Fibromyalgia    GERD (gastroesophageal reflux disease)    History of pneumonia 2015   History of substance abuse (HCC)    Hypertension    Migraine    Mixed hyperlipidemia    Morbid obesity (HCC)    Neuropathy    OSA (obstructive sleep apnea)    Plantar fascial fibromatosis    Spondylosis    Vitamin D  deficiency 03/27/2016    Past Surgical History:  Procedure Laterality Date   CYST REMOVAL HAND Left    ESOPHAGEAL DILATION     ESOPHAGOGASTRODUODENOSCOPY  2006   Dr. Rehman:small hh, esophageal dilation.   ESOPHAGOGASTRODUODENOSCOPY (EGD) WITH PROPOFOL  N/A 05/15/2019  Dr. Margarette Shawl: Esophagitis with circumferential erosions or excoriations involving 10 cm segment of mid esophagus with overlying exudate.  Most proximal and distal esophagus appeared normal.  Biopsies revealed inflammation only.  Esophagus dilated.   FOOT SURGERY Right    'knot removed"   MALONEY DILATION N/A 05/15/2019   Procedure: MALONEY DILATION;  Surgeon: Suzette Espy, MD;  Location: [facility];  Service: Endoscopy;  Laterality: N/A;   MULTIPLE EXTRACTIONS WITH ALVEOLOPLASTY N/A 05/29/2016   Procedure: MULTIPLE EXTRACTIONS;  Surgeon: Ascencion Lava, DDS;  Location: [facility] OR;  Service: Oral Surgery;  Laterality: N/A;   WISDOM TOOTH EXTRACTION      Family History  Problem Relation Age of Onset   Cancer Maternal Grandmother         lung, back   Diabetes Paternal Grandmother    Heart disease Paternal Grandmother    Diabetes Paternal Grandfather    Heart disease Paternal Grandfather    Stroke Paternal Grandfather     Social History   Socioeconomic History   Marital status: Single    Spouse name: Not on file   Number of children: Not on file   Years of education: Not on file   Highest education level: Associate degree: academic program  Occupational History   Not on file  Tobacco Use   Smoking status: Every Day    Current packs/day: 1.00    Average packs/day: 1 pack/day for 10.0 years (10.0 ttl pk-yrs)    Types: Cigarettes   Smokeless tobacco: Never  Vaping Use   Vaping status: Never Used  Substance and Sexual Activity   Alcohol use: Not Currently   Drug use: Not Currently    Types: Cocaine   Sexual activity: Not Currently    Partners: Male    Birth control/protection: None, Abstinence  Other Topics Concern   Not on file  Social History Narrative   Not on file   Social Drivers of Health   Financial Resource Strain: Low Risk  (09/15/2023)   Overall Financial Resource Strain (CARDIA)    Difficulty of Paying Living Expenses: Not hard at all  Food Insecurity: No Food Insecurity (09/15/2023)   Hunger Vital Sign    Worried About Running Out of Food in the Last Year: Never true    Ran Out of Food in the Last Year: Never true  Transportation Needs: No Transportation Needs (09/15/2023)   PRAPARE - Administrator, Civil Service (Medical): No    Lack of Transportation (Non-Medical): No  Physical Activity: Unknown (09/15/2023)   Exercise Vital Sign    Days of Exercise per Week: 0 days    Minutes of Exercise per Session: Not on file  Stress: No Stress Concern Present (09/15/2023)   [employer] of Occupational Health - Occupational Stress Questionnaire    Feeling of Stress : Only a little  Social Connections: Socially Isolated (09/15/2023)   Social Connection and Isolation Panel [NHANES]     Frequency of Communication with Friends and Family: Once a week    Frequency of Social Gatherings with Friends and Family: Never    Attends Religious Services: Never    Database administrator or Organizations: No    Attends Engineer, structural: Not on file    Marital Status: Never married  Intimate Partner Violence: Not At Risk (11/13/2021)   Humiliation, Afraid, Rape, and Kick questionnaire    Fear of Current or Ex-Partner: No    Emotionally Abused: No    Physically Abused: No  Sexually Abused: No    ROS Review of Systems  Constitutional:  Positive for fatigue. Negative for chills and fever.  HENT:  Positive for congestion. Negative for sore throat.   Eyes:  Negative for pain and discharge.  Respiratory:  Positive for shortness of breath (Intermittent). Negative for cough.   Cardiovascular:  Negative for chest pain and palpitations.  Gastrointestinal:  Negative for abdominal pain, diarrhea, nausea and vomiting.  Endocrine: Negative for polydipsia and polyuria.  Genitourinary:  Negative for dysuria and hematuria.  Musculoskeletal:  Positive for arthralgias, back pain and neck pain. Negative for neck stiffness.  Skin:  Negative for rash.  Neurological:  Negative for dizziness and weakness.  Psychiatric/Behavioral:  Positive for sleep disturbance. Negative for agitation and behavioral problems. The patient is nervous/anxious.     Objective:   Today's Vitals: BP 127/81   Pulse 68   Ht 5\' 6"  (1.676 m)   Wt (!) 338 lb 6.4 oz (153.5 kg)   SpO2 93%   BMI 54.62 kg/m   Physical Exam Vitals reviewed.  Constitutional:      General: She is not in acute distress.    Appearance: She is obese. She is not diaphoretic.     Comments: Has a rolling walker  HENT:     Head: Normocephalic and atraumatic.     Nose: Nose normal.     Mouth/Throat:     Mouth: Mucous membranes are moist.  Eyes:     General: No scleral icterus.    Extraocular Movements: Extraocular movements  intact.  Cardiovascular:     Rate and Rhythm: Normal rate and regular rhythm.     Heart sounds: Normal heart sounds. No murmur heard. Pulmonary:     Breath sounds: Normal breath sounds. No wheezing or rales.  Musculoskeletal:     Cervical back: Neck supple. No tenderness.     Right lower leg: No edema.     Left lower leg: No edema.  Feet:     Comments: Tenderness over sole area near 3rd and 4th toe Skin:    General: Skin is warm.     Findings: No rash.  Neurological:     General: No focal deficit present.     Mental Status: She is alert and oriented to person, place, and time.     Sensory: Sensory deficit (B/l LE) present.     Motor: Weakness (B/l LE - 4/5) present.  Psychiatric:        Mood and Affect: Mood normal.        Behavior: Behavior normal.     Assessment & Plan:   Problem List Items Addressed This Visit       Cardiovascular and Mediastinum   Essential hypertension   BP Readings from Last 1 Encounters:  11/22/23 127/81   Well-controlled with hydrochlorothiazide  25 mg QD Has Lasix 20 mg as needed for leg swelling Counseled for compliance with the medications Advised DASH diet and moderate exercise/walking as tolerated         Endocrine   Type 2 diabetes mellitus with other specified complication (HCC) - Primary   Lab Results  Component Value Date   HGBA1C 7.6 (H) 11/22/2023    Uncontrolled currently, but significantly improved compared to prior Associated with HTN, HLD and neuropathy CGM data reviewed On Lantus  50 units nightly with ISS (150-200: 4 U, 201-250: 6 U, 251-300: 8 U, 301-350: 10 U, 351-400: 12 U, 400 and above: 14 U and call) On Ozempic  0.5 mg QW, increased dose  to 1 mg QW -advised to reduce dose of Lantus  to 40 units nightly once she increases dose of Ozempic  On Jardiance 25 mg once daily Metformin was not considered due to her declining kidney function and other psychiatric medication interactions in the past  Checks blood glucose 3  times daily before meals and at bedtime, continues to benefit from CGM for better glycemic profile and regular insulin  dosages - has Dexcom G7 Advised to follow diabetic diet On statin F/u CMP and lipid panel Diabetic eye exam: Advised to follow up with Ophthalmology for diabetic eye exam  On gabapentin  600 mg 3 times daily for DM neuropathy and chronic neck, back pain      Relevant Medications   Semaglutide , 1 MG/DOSE, 4 MG/3ML SOPN   Other Relevant Orders   CMP14+EGFR (Completed)   Hemoglobin A1c (Completed)   Diabetic polyneuropathy associated with type 2 diabetes mellitus (HCC)   On gabapentin  600 mg 3 times daily      Relevant Medications   Semaglutide , 1 MG/DOSE, 4 MG/3ML SOPN   Other Relevant Orders   Ambulatory referral to Podiatry     Nervous and Auditory   Cervical disc disorder with radiculopathy of cervical region   Has chronic neck pain with radicular symptoms to bilateral UE Takes gabapentin  600 mg 3 times daily Has Norco as needed for severe pain, refilled        Other   Bipolar 1 disorder, mixed, moderate (HCC)   Overall well controlled with Vraylar 1.5 mg once daily, Seroquel  300 mg nightly, Lamictal  100 mg BID On prazosin 2 mg nightly, hydroxyzine 25 mg twice daily and BuSpar 15 mg 3 times daily for anxiety Followed by psychiatry - Dr Akintayo [facility] in [place]      Left foot pain   For the last 1 month Location of pain concerning for Morton's neuroma Referred to podiatry      Relevant Orders   Ambulatory referral to Podiatry   Other Visit Diagnoses       Colon cancer screening       Relevant Orders   Ambulatory referral to Gastroenterology        Outpatient Encounter Medications as of 11/22/2023  Medication Sig   Semaglutide , 1 MG/DOSE, 4 MG/3ML SOPN Inject 1 mg as directed once a week.   atorvastatin  (LIPITOR) 20 MG tablet TAKE 1 TABLET BY MOUTH EVERY MORNING FOR high cholesterol   busPIRone (BUSPAR) 15 MG  tablet Take 15 mg by mouth 3 (three) times daily.   COMBIVENT  RESPIMAT 20-100 MCG/ACT AERS respimat Inhale 1 puff into the lungs every 6 (six) hours as needed for wheezing.   Continuous Glucose Receiver (DEXCOM G7 RECEIVER) DEVI Use it to check blood glucose 3 times before meals, at bedtime and as needed.   Continuous Glucose Sensor (DEXCOM G7 SENSOR) MISC Use it to check blood glucose 3 times before meals, at bedtime and as needed.   Cyanocobalamin (VITAMIN B-12) 2000 MCG TBCR Take 1 tablet by mouth daily.   fluticasone  (FLONASE ) 50 MCG/ACT nasal spray Place 2 sprays into both nostrils daily.   furosemide (LASIX) 20 MG tablet Take 20 mg by mouth daily.   gabapentin  (NEURONTIN ) 600 MG tablet Take 600 mg by mouth 3 (three) times daily.   hydrochlorothiazide  (HYDRODIURIL ) 25 MG tablet Take 25 mg by mouth daily.   HYDROcodone -acetaminophen  (NORCO) 10-325 MG tablet Take 1 tablet by mouth daily as needed.   hydrOXYzine (ATARAX) 25 MG tablet Take 25 mg by mouth  2 (two) times daily.   insulin  aspart (NOVOLOG  FLEXPEN) 100 UNIT/ML FlexPen Use sliding scale until you follow up with your PCP and establish a regimen for meals.   insulin  glargine (LANTUS  SOLOSTAR) 100 UNIT/ML Solostar Pen Inject 50 Units into the skin at bedtime.   Insulin  Pen Needle 32G X 4 MM MISC 1 Needle by Does not apply route as needed.   lamoTRIgine  (LAMICTAL ) 100 MG tablet Take 100 mg by mouth 2 (two) times daily.   medroxyPROGESTERone  (PROVERA ) 10 MG tablet TAKE 1 TABLET BY MOUTH DAILY FOR THE first 10 DAYS of each calender MONTH   methocarbamol  (ROBAXIN ) 750 MG tablet Take 750 mg by mouth 4 (four) times daily as needed for muscle spasms.    omeprazole (PRILOSEC) 40 MG capsule TAKE ONE CAPSULE BY MOUTH TWICE DAILY FOR acid reflux   ondansetron  (ZOFRAN ) 4 MG tablet Take 4 mg by mouth 3 (three) times daily as needed for nausea or vomiting. (Patient not taking: Reported on 09/11/2022)   prazosin (MINIPRESS) 2 MG capsule Take 2 mg by mouth  at bedtime.   Prenatal Vit-Fe Fumarate-FA (PRENATAL COMPLETE PO) Take 1 tablet by mouth daily.    QUEtiapine  (SEROQUEL ) 300 MG tablet Take 300 mg by mouth at bedtime.   silver  sulfADIAZINE  (SILVADENE ) 1 % cream Use to areas 2-3 times daily as needed   SYMBICORT 160-4.5 MCG/ACT inhaler INHALE 2 PUFFS TWICE DAILY TO prevent breathing problems   topiramate  (TOPAMAX ) 100 MG tablet Take 100 mg by mouth daily.   VENTOLIN  HFA 108 (90 Base) MCG/ACT inhaler INHALE 2 PUFFS into THE lungs EVERY 4 HOURS   Vitamin D , Ergocalciferol , (DRISDOL) 1.25 MG (50000 UT) CAPS capsule Take 50,000 Units by mouth every 7 (seven) days.   [DISCONTINUED] JARDIANCE 25 MG TABS tablet Take 25 mg by mouth daily.   [DISCONTINUED] ondansetron  (ZOFRAN ) 4 MG tablet Take 4 mg by mouth every 8 (eight) hours as needed for nausea or vomiting.   [DISCONTINUED] Semaglutide ,0.25 or 0.5MG /DOS, (OZEMPIC , 0.25 OR 0.5 MG/DOSE,) 2 MG/3ML SOPN Inject 0.5 mg into the skin once a week.   No facility-administered encounter medications on file as of 11/22/2023.    Follow-up: Return in about 3 months (around 02/22/2024) for DM.   Meldon Sport, MD

## 2023-11-23 ENCOUNTER — Encounter (INDEPENDENT_AMBULATORY_CARE_PROVIDER_SITE_OTHER): Payer: Self-pay | Admitting: *Deleted

## 2023-11-23 ENCOUNTER — Other Ambulatory Visit (HOSPITAL_COMMUNITY): Payer: Self-pay

## 2023-11-23 ENCOUNTER — Ambulatory Visit: Payer: Self-pay | Admitting: Internal Medicine

## 2023-11-23 ENCOUNTER — Other Ambulatory Visit: Payer: Self-pay | Admitting: Internal Medicine

## 2023-11-23 ENCOUNTER — Telehealth: Payer: Self-pay | Admitting: Pharmacy Technician

## 2023-11-23 DIAGNOSIS — M79672 Pain in left foot: Secondary | ICD-10-CM | POA: Insufficient documentation

## 2023-11-23 LAB — CMP14+EGFR
ALT: 19 IU/L (ref 0–32)
AST: 15 IU/L (ref 0–40)
Albumin: 4.4 g/dL (ref 3.9–4.9)
Alkaline Phosphatase: 157 IU/L — ABNORMAL HIGH (ref 44–121)
BUN/Creatinine Ratio: 9 (ref 9–23)
BUN: 11 mg/dL (ref 6–24)
Bilirubin Total: 0.3 mg/dL (ref 0.0–1.2)
CO2: 21 mmol/L (ref 20–29)
Calcium: 9.5 mg/dL (ref 8.7–10.2)
Chloride: 101 mmol/L (ref 96–106)
Creatinine, Ser: 1.24 mg/dL — ABNORMAL HIGH (ref 0.57–1.00)
Globulin, Total: 2.6 g/dL (ref 1.5–4.5)
Glucose: 133 mg/dL — ABNORMAL HIGH (ref 70–99)
Potassium: 4.2 mmol/L (ref 3.5–5.2)
Sodium: 137 mmol/L (ref 134–144)
Total Protein: 7 g/dL (ref 6.0–8.5)
eGFR: 54 mL/min/{1.73_m2} — ABNORMAL LOW (ref >59)

## 2023-11-23 LAB — HEMOGLOBIN A1C
Est. average glucose Bld gHb Est-mCnc: 171 mg/dL
Hgb A1c MFr Bld: 7.6 % — ABNORMAL HIGH (ref 4.8–5.6)

## 2023-11-23 NOTE — Assessment & Plan Note (Signed)
 Lab Results  Component Value Date   HGBA1C 7.6 (H) 11/22/2023    Uncontrolled currently, but significantly improved compared to prior Associated with HTN, HLD and neuropathy CGM data reviewed On Lantus  50 units nightly with ISS (150-200: 4 U, 201-250: 6 U, 251-300: 8 U, 301-350: 10 U, 351-400: 12 U, 400 and above: 14 U and call) On Ozempic  0.5 mg QW, increased dose to 1 mg QW -advised to reduce dose of Lantus  to 40 units nightly once she increases dose of Ozempic  On Jardiance 25 mg once daily Metformin was not considered due to her declining kidney function and other psychiatric medication interactions in the past  Checks blood glucose 3 times daily before meals and at bedtime, continues to benefit from CGM for better glycemic profile and regular insulin  dosages - has Dexcom G7 Advised to follow diabetic diet On statin F/u CMP and lipid panel Diabetic eye exam: Advised to follow up with Ophthalmology for diabetic eye exam  On gabapentin  600 mg 3 times daily for DM neuropathy and chronic neck, back pain

## 2023-11-23 NOTE — Assessment & Plan Note (Signed)
 For the last 1 month Location of pain concerning for Morton's neuroma Referred to podiatry

## 2023-11-23 NOTE — Telephone Encounter (Signed)
 Pharmacy Patient Advocate Encounter   Received notification from CoverMyMeds that prior authorization for Jardiance 25MG  tablets is required/requested.   Insurance verification completed.   The patient is insured through [facility] .   Per test claim: PA required; PA submitted to above mentioned insurance via CoverMyMeds Key/confirmation #/EOC [id number] Status is pending

## 2023-11-23 NOTE — Assessment & Plan Note (Signed)
On gabapentin 600 mg 3 times daily

## 2023-11-24 ENCOUNTER — Other Ambulatory Visit (HOSPITAL_COMMUNITY): Payer: Self-pay

## 2023-11-24 NOTE — Telephone Encounter (Signed)
 Pharmacy Patient Advocate Encounter  Received notification from [facility] that Prior Authorization for Jardiance 25MG  tablets has been APPROVED from 11/23/2023 to 11/22/2024. Unable to obtain price due to refill too soon rejection, last fill date 11/23/2023 next available fill date06/10/2023.   PA #/Case ID/Reference #: [id number]

## 2023-11-29 ENCOUNTER — Encounter: Payer: Self-pay | Admitting: Primary Care

## 2023-11-29 ENCOUNTER — Ambulatory Visit: Admitting: Primary Care

## 2023-11-29 VITALS — BP 134/72 | HR 97 | Temp 97.4°F | Ht 66.0 in | Wt 333.2 lb

## 2023-11-29 DIAGNOSIS — F17210 Nicotine dependence, cigarettes, uncomplicated: Secondary | ICD-10-CM | POA: Diagnosis not present

## 2023-11-29 DIAGNOSIS — Z6841 Body Mass Index (BMI) 40.0 and over, adult: Secondary | ICD-10-CM

## 2023-11-29 DIAGNOSIS — J449 Chronic obstructive pulmonary disease, unspecified: Secondary | ICD-10-CM

## 2023-11-29 DIAGNOSIS — E669 Obesity, unspecified: Secondary | ICD-10-CM | POA: Diagnosis not present

## 2023-11-29 DIAGNOSIS — G4733 Obstructive sleep apnea (adult) (pediatric): Secondary | ICD-10-CM

## 2023-11-29 NOTE — Progress Notes (Signed)
 @Patient  ID: [name], female    DOB: 01-Jul-1976, 48 y.o.   MRN: [id number]  No chief complaint on file.   Referring provider: Meldon Sport, MD  HPI: 48 year old female, current ever day smoker. PMH significant for hypertension, COPD, OSA, GERD, esophageal dysphagia, type 2 diabetes, cervical disc disorder with radiculopathy, mixed bipolar 1 disorder, hyperlipidemia, vit D deficiency, obesity.   11/29/2023 Discussed the use of AI scribe software for clinical note transcription with the patient, who gave verbal consent to proceed.  History of Present Illness   [name] is a 48 year old female with severe obstructive sleep apnea who presents for evaluation and management of her condition.  She has a history of sleep apnea, initially diagnosed as mild in 2009, which did not require treatment at that time. A repeat sleep study in January 2021 revealed severe obstructive sleep apnea with an AHI of 59.7 events per hour and a minimum oxygen saturation of 84%. She was prescribed an auto CPAP with a pressure setting of 10 to 20 cm of water, but she never received the machine due to issues with the prescription provider and Medicaid coverage.  She experiences daytime tiredness and takes several medications at night, including Seroquel , prazosin, and gabapentin , which she takes together. No residual grogginess during the day. She also takes Norco as needed, which lasts her two to three months, and Atarax for severe anxiety, also as needed.  She has experienced significant weight loss, approximately 75 pounds, and is currently on Ozempic , having started it about six weeks ago. Her current weight is 333 pounds.  She has COPD and uses Symbicort, which she finds helpful. She is a current smoker, smoking about a pack and a half per day, and wants to quit. She has attempted to quit by not purchasing cigarettes and asking her daughter not to buy them, but has not tried nicotine patches yet.  Her  sleep is disrupted by gasping, especially if the room is warm, and she keeps her house very cold to mitigate this. She uses a ceiling fan and a regular fan while sleeping. She wakes up three to four times a night and starts her day between 9 and 10 AM. 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 No falling asleep while driving or having any accidents related to sleepiness.  No use of oxygen, atrial fibrillation, heart failure, seizure disorders, sleepwalking, or narcolepsy. She does not drink alcohol and prefers to sleep on her side. Epworth score 5/24.      Allergies  Allergen Reactions   Penicillins Rash     Has patient had a PCN reaction causing immediate rash, facial/tongue/throat swelling, SOB or lightheadedness with hypotension:  # # YES # #  Has patient had a PCN reaction causing severe rash involving mucus membranes or skin necrosis:  # # YES # #  Has patient had a PCN reaction that required hospitalization   .No Has patient had a PCN reaction occurring within the last 10 years: No If all of the above answers are "NO", then may proceed with Cephalosporin use.    Sulfa Antibiotics Anaphylaxis, Hives and Swelling    Swelling in hands/legs DIFFICULTY SWALLOWING   Latex Other (See Comments)    Red at site of latex bandage-Dermabond   Nisoldipine    Other Other (See Comments)    Pt says pain meds make her nauseous     Immunization History  Administered Date(s) Administered  Influenza,inj,Quad PF,6+ Mos 05/25/2017   Pneumococcal Polysaccharide-23 05/25/2017   Tdap 08/14/2013, 05/22/2017    Past Medical History:  Diagnosis Date   ADHD    Agoraphobia    Anxiety    Asthma    Bipolar disorder (HCC)    Concussion    DDD (degenerative disc disease), lumbar    Fibromyalgia    GERD (gastroesophageal reflux disease)    History of pneumonia 2015   History of substance abuse (HCC)    Hypertension    Migraine    Mixed hyperlipidemia    Morbid obesity (HCC)     Neuropathy    OSA (obstructive sleep apnea)    Plantar fascial fibromatosis    Spondylosis    Vitamin D  deficiency 03/27/2016    Tobacco History: Social History   Tobacco Use  Smoking Status Every Day   Current packs/day: 1.00   Average packs/day: 1 pack/day for 10.0 years (10.0 ttl pk-yrs)   Types: Cigarettes  Smokeless Tobacco Never   Ready to quit: Not Answered Counseling given: Not Answered   Outpatient Medications Prior to Visit  Medication Sig Dispense Refill   atorvastatin  (LIPITOR) 20 MG tablet TAKE 1 TABLET BY MOUTH EVERY MORNING FOR high cholesterol 90 tablet 3   busPIRone (BUSPAR) 15 MG tablet Take 15 mg by mouth 3 (three) times daily.     COMBIVENT  RESPIMAT 20-100 MCG/ACT AERS respimat Inhale 1 puff into the lungs every 6 (six) hours as needed for wheezing. 4 g 5   Continuous Glucose Receiver (DEXCOM G7 RECEIVER) DEVI Use it to check blood glucose 3 times before meals, at bedtime and as needed. 1 each 0   Continuous Glucose Sensor (DEXCOM G7 SENSOR) MISC Use it to check blood glucose 3 times before meals, at bedtime and as needed. 3 each 5   Cyanocobalamin (VITAMIN B-12) 2000 MCG TBCR Take 1 tablet by mouth daily.     fluticasone  (FLONASE ) 50 MCG/ACT nasal spray Place 2 sprays into both nostrils daily. 16 g 6   furosemide (LASIX) 20 MG tablet Take 20 mg by mouth daily.     gabapentin  (NEURONTIN ) 600 MG tablet Take 600 mg by mouth 3 (three) times daily.     hydrochlorothiazide  (HYDRODIURIL ) 25 MG tablet Take 25 mg by mouth daily.     HYDROcodone -acetaminophen  (NORCO) 10-325 MG tablet Take 1 tablet by mouth daily as needed. 30 tablet 0   hydrOXYzine (ATARAX) 25 MG tablet Take 25 mg by mouth 2 (two) times daily.     insulin  aspart (NOVOLOG  FLEXPEN) 100 UNIT/ML FlexPen Use sliding scale until you follow up with your PCP and establish a regimen for meals. 15 mL 11   insulin  glargine (LANTUS  SOLOSTAR) 100 UNIT/ML Solostar Pen Inject 50 Units into the skin at bedtime. 75 mL 0    Insulin  Pen Needle 32G X 4 MM MISC 1 Needle by Does not apply route as needed. 30 each 1   JARDIANCE 25 MG TABS tablet TAKE 1 TABLET BY MOUTH DAILY IN THE MORNING 90 tablet 3   lamoTRIgine  (LAMICTAL ) 100 MG tablet Take 100 mg by mouth 2 (two) times daily.     medroxyPROGESTERone  (PROVERA ) 10 MG tablet TAKE 1 TABLET BY MOUTH DAILY FOR THE first 10 DAYS of each calender MONTH 10 tablet 11   methocarbamol  (ROBAXIN ) 750 MG tablet Take 750 mg by mouth 4 (four) times daily as needed for muscle spasms.      omeprazole (PRILOSEC) 40 MG capsule TAKE ONE CAPSULE BY MOUTH TWICE DAILY  FOR acid reflux 180 capsule 3   ondansetron  (ZOFRAN ) 4 MG tablet Take 4 mg by mouth 3 (three) times daily as needed for nausea or vomiting. (Patient not taking: Reported on 09/11/2022)     prazosin (MINIPRESS) 2 MG capsule Take 2 mg by mouth at bedtime.     Prenatal Vit-Fe Fumarate-FA (PRENATAL COMPLETE PO) Take 1 tablet by mouth daily.      QUEtiapine  (SEROQUEL ) 300 MG tablet Take 300 mg by mouth at bedtime.     Semaglutide , 1 MG/DOSE, 4 MG/3ML SOPN Inject 1 mg as directed once a week. 3 mL 3   silver  sulfADIAZINE  (SILVADENE ) 1 % cream Use to areas 2-3 times daily as needed 50 g 11   SYMBICORT 160-4.5 MCG/ACT inhaler INHALE 2 PUFFS TWICE DAILY TO prevent breathing problems 10.2 g 3   topiramate  (TOPAMAX ) 100 MG tablet Take 100 mg by mouth daily.     VENTOLIN  HFA 108 (90 Base) MCG/ACT inhaler INHALE 2 PUFFS into THE lungs EVERY 4 HOURS 18 g 3   Vitamin D , Ergocalciferol , (DRISDOL) 1.25 MG (50000 UT) CAPS capsule Take 50,000 Units by mouth every 7 (seven) days.     No facility-administered medications prior to visit.      Review of Systems  Review of Systems  Constitutional: Negative.   HENT: Negative.    Respiratory: Negative.    Psychiatric/Behavioral:  Positive for sleep disturbance.     Physical Exam  There were no vitals taken for this visit. Physical Exam Constitutional:      Appearance: Normal appearance.   HENT:     Head: Normocephalic and atraumatic.     Mouth/Throat:     Mouth: Mucous membranes are moist.     Pharynx: Oropharynx is clear.     Comments: Mallampati class III Cardiovascular:     Rate and Rhythm: Normal rate and regular rhythm.  Pulmonary:     Effort: Pulmonary effort is normal. No respiratory distress.     Breath sounds: Normal breath sounds. No stridor. No rhonchi or rales.     Comments: Isolated wheeze right middle lobe, otherwise clear  Musculoskeletal:        General: Normal range of motion.  Skin:    General: Skin is warm and dry.  Neurological:     General: No focal deficit present.     Mental Status: She is alert and oriented to person, place, and time. Mental status is at baseline.  Psychiatric:        Mood and Affect: Mood normal.        Behavior: Behavior normal.        Thought Content: Thought content normal.        Judgment: Judgment normal.      Lab Results:  CBC    Component Value Date/Time   WBC 8.1 09/20/2023 1125   WBC 8.8 09/11/2022 1404   RBC 5.06 09/20/2023 1125   RBC 4.63 09/11/2022 1404   HGB 16.1 (H) 09/20/2023 1125   HCT 48.0 (H) 09/20/2023 1125   PLT 216 09/20/2023 1125   MCV 95 09/20/2023 1125   MCH 31.8 09/20/2023 1125   MCH 31.3 09/11/2022 1404   MCHC 33.5 09/20/2023 1125   MCHC 33.8 09/11/2022 1404   RDW 14.8 09/20/2023 1125   LYMPHSABS 1.8 09/20/2023 1125   MONOABS 0.5 09/11/2022 1404   EOSABS 0.2 09/20/2023 1125   BASOSABS 0.0 09/20/2023 1125    BMET    Component Value Date/Time   NA 137 11/22/2023 1505  K 4.2 11/22/2023 1505   CL 101 11/22/2023 1505   CO2 21 11/22/2023 1505   GLUCOSE 133 (H) 11/22/2023 1505   GLUCOSE 611 (HH) 09/11/2022 1404   BUN 11 11/22/2023 1505   CREATININE 1.24 (H) 11/22/2023 1505   CALCIUM  9.5 11/22/2023 1505   GFRNONAA 59 (L) 09/11/2022 1404   GFRAA >60 05/12/2019 0817    BNP    Component Value Date/Time   BNP 43.0 11/01/2018 2230    ProBNP No results found for:  "PROBNP"  Imaging: No results found.   Assessment & Plan:   1. Obstructive sleep apnea of adult (Primary) - Home sleep test; Future  2. Chronic obstructive pulmonary disease, unspecified COPD type (HCC)   Assessment and Plan    Severe obstructive sleep apnea Severe obstructive sleep apnea diagnosed in January 2021 with an AHI of 59.7 events per hour and minimum oxygen saturation of 84%. She did not receive CPAP due to prescription processing issues. Symptoms include daytime somnolence and nocturnal gasping. Discussed risks of untreated sleep apnea, including cardiac arrhythmias, stroke, pulmonary hypertension, and diabetes. CPAP is the gold standard treatment for moderate to severe sleep apnea. Weight loss may reduce severity. Discussed alternative treatments such as oral appliances and surgical options, though CPAP remains preferred for moderate to severe cases. - Order home sleep study to reconfirm diagnosis - If confirmed, initiate auto CPAP therapy with settings of 10-20 cm H2O - Discuss potential impact of weight loss on sleep apnea - Counsel on positional therapy, avoiding supine position  Chronic obstructive pulmonary disease (COPD) COPD managed with Symbicort, effectively controlling symptoms. She smokes approximately 1.5 packs per day. No recent hospitalizations for exacerbations. Smoking cessation and weight loss encouraged.   Nicotine dependence, cigarettes She smokes about 1.5 packs per day and desires to quit. Previous cessation attempts, including cold [place], were unsuccessful. Her daughter's smoking may contribute to difficulty quitting. Discussed nicotine replacement therapy as an option. - Recommend nicotine patch for cessation - Provide information on 1-800-QUIT-NOW for potential free nicotine patches  Obesity She has lost approximately 75 pounds, currently weighing 333 pounds. Recently started on Ozempic , currently at 0.5 mg with plans to increase to 1 mg. Weight  loss may positively impact sleep apnea severity.  Severe anxiety Severe anxiety managed with hydroxyzine as needed, particularly for situations like medical appointments. Anxiety may contribute to sleep disturbances.   Antonio Baumgarten, NP 11/29/2023

## 2023-11-29 NOTE — Patient Instructions (Addendum)
 -  SEVERE OBSTRUCTIVE SLEEP APNEA: Severe obstructive sleep apnea is a condition where your breathing repeatedly stops and starts during sleep, leading to poor sleep quality and other health risks. We will order a home sleep study to reconfirm your diagnosis. If confirmed, we will start you on auto CPAP therapy with settings of 10-20 cm H2O. Weight loss may help reduce the severity of your sleep apnea. Additionally, we discussed positional therapy, which involves avoiding sleeping on your back.  -CHRONIC OBSTRUCTIVE PULMONARY DISEASE (COPD): COPD is a chronic lung disease that makes it hard to breathe. Your COPD is currently managed with Symbicort, which you find helpful. Continue using Symbicort as prescribed.  -NICOTINE DEPENDENCE, CIGARETTES: Nicotine dependence is an addiction to tobacco products. You smoke about 1.5 packs per day and want to quit. We recommend trying nicotine patches to help you quit smoking. You can also call 1-800-QUIT-NOW for support and potential free nicotine patches.  -OBESITY: Obesity is a condition where you have an excessive amount of body fat. You have lost approximately 75 pounds and are currently on Ozempic , which may help with further weight loss. Weight loss can positively impact the severity of your sleep apnea.  -SEVERE ANXIETY: Severe anxiety is a condition that causes intense, excessive, and persistent worry and fear about everyday situations. Your anxiety is managed with hydroxyzine as needed, especially for situations like medical appointments. Anxiety may also contribute to your sleep disturbances.  Follow-up Call our office or send a mychart message 2-3 weeks after completing sleep study for results, if study reconfirms dx sleep apnea we will place an order for CPAP and fu 31-90 days after starting therapy

## 2023-12-01 ENCOUNTER — Ambulatory Visit (INDEPENDENT_AMBULATORY_CARE_PROVIDER_SITE_OTHER): Admitting: Podiatry

## 2023-12-01 ENCOUNTER — Ambulatory Visit (INDEPENDENT_AMBULATORY_CARE_PROVIDER_SITE_OTHER)

## 2023-12-01 ENCOUNTER — Encounter: Payer: Self-pay | Admitting: Podiatry

## 2023-12-01 VITALS — Ht 66.0 in | Wt 333.2 lb

## 2023-12-01 DIAGNOSIS — M7752 Other enthesopathy of left foot: Secondary | ICD-10-CM

## 2023-12-01 DIAGNOSIS — M79672 Pain in left foot: Secondary | ICD-10-CM

## 2023-12-01 MED ORDER — MELOXICAM 15 MG PO TABS
15.0000 mg | ORAL_TABLET | Freq: Every day | ORAL | 1 refills | Status: DC
Start: 2023-12-01 — End: 2024-02-22

## 2023-12-01 MED ORDER — BETAMETHASONE SOD PHOS & ACET 6 (3-3) MG/ML IJ SUSP
3.0000 mg | Freq: Once | INTRAMUSCULAR | Status: AC
  Administered 2023-12-01: 3 mg via INTRA_ARTICULAR

## 2023-12-01 NOTE — Progress Notes (Signed)
 Chief Complaint  Patient presents with   Foot Pain    Pt is here due to left foot pain states she has been in pain for over a year, states its the bottom of foot near the toes feels like something is stabbing her.    HPI: 48 y.o. female presenting today for evaluation of pain and tenderness associated to the left forefoot.  Ongoing for just over a year now.  Idiopathic gradual onset.  No history of injury.  She takes ibuprofen  3 times daily  Past Medical History:  Diagnosis Date   ADHD    Agoraphobia    Anxiety    Asthma    Bipolar disorder (HCC)    Concussion    DDD (degenerative disc disease), lumbar    Fibromyalgia    GERD (gastroesophageal reflux disease)    History of pneumonia 2015   History of substance abuse (HCC)    Hypertension    Migraine    Mixed hyperlipidemia    Morbid obesity (HCC)    Neuropathy    OSA (obstructive sleep apnea)    Plantar fascial fibromatosis    Spondylosis    Vitamin D  deficiency 03/27/2016    Past Surgical History:  Procedure Laterality Date   CYST REMOVAL HAND Left    ESOPHAGEAL DILATION     ESOPHAGOGASTRODUODENOSCOPY  2006   Dr. Rehman:small hh, esophageal dilation.   ESOPHAGOGASTRODUODENOSCOPY (EGD) WITH PROPOFOL  N/A 05/15/2019   Dr. Margarette Shawl: Esophagitis with circumferential erosions or excoriations involving 10 cm segment of mid esophagus with overlying exudate.  Most proximal and distal esophagus appeared normal.  Biopsies revealed inflammation only.  Esophagus dilated.   FOOT SURGERY Right    'knot removed"   MALONEY DILATION N/A 05/15/2019   Procedure: MALONEY DILATION;  Surgeon: Suzette Espy, MD;  Location: [facility];  Service: Endoscopy;  Laterality: N/A;   MULTIPLE EXTRACTIONS WITH ALVEOLOPLASTY N/A 05/29/2016   Procedure: MULTIPLE EXTRACTIONS;  Surgeon: Ascencion Lava, DDS;  Location: [facility] OR;  Service: Oral Surgery;  Laterality: N/A;   WISDOM TOOTH EXTRACTION      Allergies  Allergen Reactions   Penicillins Rash      Has patient had a PCN reaction causing immediate rash, facial/tongue/throat swelling, SOB or lightheadedness with hypotension:  # # YES # #  Has patient had a PCN reaction causing severe rash involving mucus membranes or skin necrosis:  # # YES # #  Has patient had a PCN reaction that required hospitalization   .No Has patient had a PCN reaction occurring within the last 10 years: No If all of the above answers are "NO", then may proceed with Cephalosporin use.    Sulfa Antibiotics Anaphylaxis, Hives and Swelling    Swelling in hands/legs DIFFICULTY SWALLOWING   Latex Other (See Comments)    Red at site of latex bandage-Dermabond   Nisoldipine    Other Other (See Comments)    Pt says pain meds make her nauseous      Physical Exam: General: The patient is alert and oriented x3 in no acute distress.  Dermatology: Skin is warm, dry and supple bilateral lower extremities.   Vascular: Palpable pedal pulses bilaterally. Capillary refill within normal limits.  No appreciable edema.  No erythema.  Neurological: Grossly intact via light touch  Musculoskeletal Exam: No pedal deformities noted.  Tenderness to palpation range of motion to the second MTP of the left foot with localized surrounding paresthesia and numbness to this area as well  Radiographic Exam  LT foot 12/01/2023:  Normal osseous mineralization. Joint spaces preserved.  No fractures or osseous irregularities noted.  Impression: Negative  Assessment/Plan of Care: 1.  Second MTP capsulitis left 2.  Metatarsalgia/neuritis left forefoot  -Patient evaluated.  X-rays reviewed -Injection of 0.5 cc Celestone Soluspan injected in the second MTP left -Prescription for meloxicam 15 mg daily PRN -Offloading quarter inch felt metatarsal pads were applied to the insoles of the shoes to offload pressure from the forefoot.  Patient states that she notices a significant difference with weightbearing -Advised against going barefoot -Return  to clinic PRN     Dot Gazella, DPM [facility]  Dr. Dot Gazella, DPM    [address] [place], [place] [address]                Office [phone]  Fax [phone]

## 2023-12-05 ENCOUNTER — Other Ambulatory Visit: Payer: Self-pay | Admitting: Internal Medicine

## 2023-12-05 DIAGNOSIS — E1169 Type 2 diabetes mellitus with other specified complication: Secondary | ICD-10-CM

## 2023-12-05 DIAGNOSIS — Z794 Long term (current) use of insulin: Secondary | ICD-10-CM

## 2023-12-09 ENCOUNTER — Ambulatory Visit

## 2023-12-09 DIAGNOSIS — G4733 Obstructive sleep apnea (adult) (pediatric): Secondary | ICD-10-CM

## 2023-12-22 ENCOUNTER — Telehealth: Payer: Self-pay | Admitting: Internal Medicine

## 2023-12-22 NOTE — Telephone Encounter (Signed)
 Medical Clearance Noted Copied Scanned Original in provider box Copy at front desk

## 2023-12-28 ENCOUNTER — Telehealth: Payer: Self-pay

## 2023-12-28 ENCOUNTER — Other Ambulatory Visit: Payer: Self-pay | Admitting: Internal Medicine

## 2023-12-28 DIAGNOSIS — M51362 Other intervertebral disc degeneration, lumbar region with discogenic back pain and lower extremity pain: Secondary | ICD-10-CM

## 2023-12-28 DIAGNOSIS — M5010 Cervical disc disorder with radiculopathy, unspecified cervical region: Secondary | ICD-10-CM

## 2023-12-28 MED ORDER — HYDROCODONE-ACETAMINOPHEN 10-325 MG PO TABS: 1.0000 | ORAL_TABLET | Freq: Every day | ORAL | 0 refills | Status: DC | PRN

## 2023-12-28 NOTE — Telephone Encounter (Signed)
 Spoke to pt informed her of this, explained form was faxed takes a while for it to go into scan if office has not received form they can refax again. Verbalized understanding.

## 2023-12-28 NOTE — Telephone Encounter (Signed)
 Copied from CRM [id number]. Topic: General - Other >> Dec 28, 2023  2:16 PM [name] wrote: Reason for CRM: Patient calling to confirm if she is to stop her Ozempic  medication for 1-2 weeks as stated on the form that she received from [facility]. States form was left at clinic on 06/09.  Patient is requesting a callback from nurse/provider.

## 2023-12-29 DIAGNOSIS — G4733 Obstructive sleep apnea (adult) (pediatric): Secondary | ICD-10-CM | POA: Diagnosis not present

## 2023-12-30 ENCOUNTER — Other Ambulatory Visit: Payer: Self-pay | Admitting: Internal Medicine

## 2024-01-05 ENCOUNTER — Ambulatory Visit: Payer: Self-pay | Admitting: Primary Care

## 2024-01-05 ENCOUNTER — Ambulatory Visit: Admitting: Primary Care

## 2024-01-05 ENCOUNTER — Encounter: Payer: Self-pay | Admitting: Primary Care

## 2024-01-05 VITALS — BP 101/68 | HR 95 | Ht 66.0 in | Wt 327.4 lb

## 2024-01-05 DIAGNOSIS — F17210 Nicotine dependence, cigarettes, uncomplicated: Secondary | ICD-10-CM | POA: Diagnosis not present

## 2024-01-05 DIAGNOSIS — G4733 Obstructive sleep apnea (adult) (pediatric): Secondary | ICD-10-CM

## 2024-01-05 NOTE — Patient Instructions (Addendum)
 YOUR PLAN: -OBSTRUCTIVE SLEEP APNEA: Obstructive sleep apnea is a condition where your airway becomes blocked during sleep, causing breathing pauses. Your recent sleep study showed 14.5 apneic events per hour, indicating mild to moderate sleep apnea. We will order an auto CPAP machine for you, which adjusts pressure settings to prevent these events. It's important to use the CPAP during all sleep periods, including naps. You should choose a comfortable mask, change the mask cushion and filter monthly, tubing every 3 months, and headgear and water chamber every 6 months. Clean the components regularly and use distilled water for humidification. Adjust the comfort settings as needed. We will follow up in 6-8 weeks to check on your compliance. If you do not hear from the medical supply store within 2 weeks, please contact our office.  -OBESITY: Obesity is a condition where excess body fat may negatively affect your health. You have lost 9 pounds since May, which is a positive step. Continued weight loss can help improve your sleep apnea and diabetes management.  -TYPE 2 DIABETES MELLITUS: Type 2 diabetes is a condition where your body does not use insulin  properly, leading to high blood sugar levels. You are currently managing it well with Ozempic , which has helped reduce your insulin  requirements and improve your blood sugar levels. You have tolerated Ozempic  well without significant side effects.  Follow-up Please follow up in 6-8 weeks for a compliance check on your CPAP therapy. If you do not hear from the medical supply store within 2 weeks, contact our office.  CPAP and BIPAP Information CPAP and BIPAP use air pressure to keep your airways open and help you breathe well. CPAP and BIPAP use different amounts of pressure. Your health care provider will tell you whether CPAP or BIPAP would be best for you. CPAP stands for continuous positive airway pressure. With CPAP, the amount of pressure stays  the same while you breathe in and out. BIPAP stands for bi-level positive airway pressure. With BIPAP, the amount of pressure will be higher when you breathe in and lower when you breathe out. This allows you to take bigger breaths. CPAP or BIPAP may be used in the hospital or at home. You may need to have a sleep study before your provider can order a device for you to use at home. What are the advantages? CPAP and BIPAP are most often used for obstructive sleep apnea to keep the airways from collapsing when the muscles relax during sleep. CPAP or BIPAP can be used if you have: Chronic obstructive pulmonary disease. Heart failure. Medical conditions that cause muscle weakness. Other problems that cause breathing to be shallow, weak, or difficult. What are the risks? Your provider will talk with you about risks. These may include: Sores on your nose or face caused from the mask, prongs, or nasal pillows. Dry or stuffy nose or nosebleeds. Feeling gassy or bloated. Sinus or lung infection if the equipment is not cleaned well. When should CPAP or BIPAP be used? In most cases, CPAP or BIPAP is used during sleep at night or whenever the main sleep time happens. It's also used during naps. People with some medical conditions may need to wear the mask when they're awake. Follow instructions from your provider about when to use your CPAP or BIPAP. What happens during CPAP or BIPAP?  Both CPAP and BIPAP use a small machine that uses electricity to create air pressure. A long tube connects the device to a plastic mask. Air is blown through  the mask into your nose or mouth. The amount of pressure that's used to blow the air can be adjusted. Your provider will set the pressure setting and help you find the best mask for you. Tips for using the mask There are different types and sizes of masks. If your mask does not fit well, talk with your provider about getting a different one. Some common types of masks  include: Full face masks, which fit over the mouth and nose. Nasal masks, which fit over the nose. Nasal pillow or prong masks, which fit into the nostrils. The mask needs to be snug to your face, so some people feel trapped or closed in at first. If you feel this way, you may need to get used to the mask. Hold the mask loosely over your nose or mouth and then gradually put the the mask on more snugly. Slowly increase the amount of time you use the mask. If you have trouble with your mask not fitting well or leaking, talk with your provider. Do not stop using the mask. Tips for using the device Follow instructions from your provider about how to and how often to use the device. For home use, CPAP and BIPAP devices come from home health care companies. There are many different brands. Your health insurance company will help to decide which device you get. Keep the CPAP or BIPAP device and attachments clean. Ask your home health care company or check the instruction book for cleaning instructions. Make sure the humidifier is filled with germ-free (sterile) water and is working correctly. This will help prevent a dry or stuffy nose or nosebleeds. A nasal saline mist or spray may keep your nose from getting dry and sore. Do not eat or drink while the CPAP or BIPAP device is on. Food or drinks could get pushed into your lungs by the pressure of the CPAP or BIPAP. Follow these instructions at home: Take over-the-counter and prescription medicines only as told by your provider. Do not smoke, vape, or use nicotine or tobacco. Contact a health care provider if: You have redness or pressure sores on your head, face, mouth, or nose from the mask or headgear. You have trouble using the CPAP or BIPAP device. You have trouble going to sleep or staying asleep. Someone tells you that you snore even when wearing your CPAP or BIPAP device. Get help right away if: You have trouble breathing. You feel  confused. These symptoms may be an emergency. Get help right away. Call 911. Do not wait to see if the symptoms will go away. Do not drive yourself to the hospital. This information is not intended to replace advice given to you by your health care provider. Make sure you discuss any questions you have with your health care provider. Document Revised: 10/21/2022 Document Reviewed: 10/21/2022 Elsevier Patient Education  2024 [employer].

## 2024-01-05 NOTE — Progress Notes (Signed)
 @Patient  ID: [name], female    DOB: 08/12/1975, 48 y.o.   MRN: [id number]  Chief Complaint  Patient presents with   Follow-up    HST f/u    Referring provider: Tobie Suzzane POUR, MD  HPI:  48 year old female, current ever day smoker. PMH significant for hypertension, COPD, OSA, GERD, esophageal dysphagia, type 2 diabetes, cervical disc disorder with radiculopathy, mixed bipolar 1 disorder, hyperlipidemia, vit D deficiency, obesity.   11/29/2023 Discussed the use of AI scribe software for clinical note transcription with the patient, who gave verbal consent to proceed.  History of Present Illness   [name] is a 48 year old female with severe obstructive sleep apnea who presents for evaluation and management of her condition.  She has a history of sleep apnea, initially diagnosed as mild in 2009, which did not require treatment at that time. A repeat sleep study in January 2021 revealed severe obstructive sleep apnea with an AHI of 59.7 events per hour and a minimum oxygen saturation of 84%. She was prescribed an auto CPAP with a pressure setting of 10 to 20 cm of water, but she never received the machine due to issues with the prescription provider and Medicaid coverage.  She experiences daytime tiredness and takes several medications at night, including Seroquel , prazosin, and gabapentin , which she takes together. No residual grogginess during the day. She also takes Norco as needed, which lasts her two to three months, and Atarax for severe anxiety, also as needed.  She has experienced significant weight loss, approximately 75 pounds, and is currently on Ozempic , having started it about six weeks ago. Her current weight is 333 pounds.  She has COPD and uses Symbicort, which she finds helpful. She is a current smoker, smoking about a pack and a half per day, and wants to quit. She has attempted to quit by not purchasing cigarettes and asking her daughter not to buy them, but  has not tried nicotine patches yet.  Her sleep is disrupted by gasping, especially if the room is warm, and she keeps her house very cold to mitigate this. She uses a ceiling fan and a regular fan while sleeping. She wakes up three to four times a night and starts her day between 9 and 10 AM. 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 No falling asleep while driving or having any accidents related to sleepiness.  No use of oxygen, atrial fibrillation, heart failure, seizure disorders, sleepwalking, or narcolepsy. She does not drink alcohol and prefers to sleep on her side. Epworth score 5/24.     01/05/2024- Interim hx  Discussed the use of AI scribe software for clinical note transcription with the patient, who gave verbal consent to proceed.  History of Present Illness   [name] is a 48 year old female who presents for follow-up on her sleep study.  She has a history of sleep apnea, initially diagnosed as mild in 2009, which did not require treatment at that time. A repeat sleep study in 2021 showed severe sleep apnea, and she was prescribed an auto CPAP machine. However, she never received the machine due to issues with the prescription and Medicaid coverage. Her recent sleep study showed 14.5 apneic events per hour.  She experiences significant daytime sleepiness, frequent awakenings at night, and occasional gasping, which has improved but still occurs. She feels 'drained' and 'weak' throughout the day and takes naps to cope with fatigue.  She is  currently on several medications including Seroquel , prazosin, and gabapentin  at night, and reports no significant grogginess in the morning. She also takes Norco as needed, with a prescription lasting two to three months, and Atarax for anxiety as needed. Additionally, she is on Ozempic , which has helped her lose weight and manage her blood sugar levels. Since starting Ozempic , she has lost nine pounds, reducing her weight from 333 pounds  in May to 327 pounds currently. Her insulin  requirement has decreased from 50 units to 40 units, and she no longer needs mealtime insulin .   Allergies  Allergen Reactions   Penicillins Rash     Has patient had a PCN reaction causing immediate rash, facial/tongue/throat swelling, SOB or lightheadedness with hypotension:  # # YES # #  Has patient had a PCN reaction causing severe rash involving mucus membranes or skin necrosis:  # # YES # #  Has patient had a PCN reaction that required hospitalization   .No Has patient had a PCN reaction occurring within the last 10 years: No If all of the above answers are NO, then may proceed with Cephalosporin use.    Sulfa Antibiotics Anaphylaxis, Hives and Swelling    Swelling in hands/legs DIFFICULTY SWALLOWING   Latex Other (See Comments)    Red at site of latex bandage-Dermabond   Nisoldipine    Other Other (See Comments)    Pt says pain meds make her nauseous     Immunization History  Administered Date(s) Administered   Influenza,inj,Quad PF,6+ Mos 05/25/2017   Pneumococcal Polysaccharide-23 05/25/2017   Tdap 08/14/2013, 05/22/2017    Past Medical History:  Diagnosis Date   ADHD    Agoraphobia    Anxiety    Asthma    Bipolar disorder (HCC)    Concussion    DDD (degenerative disc disease), lumbar    Fibromyalgia    GERD (gastroesophageal reflux disease)    History of pneumonia 2015   History of substance abuse (HCC)    Hypertension    Migraine    Mixed hyperlipidemia    Morbid obesity (HCC)    Neuropathy    OSA (obstructive sleep apnea)    Plantar fascial fibromatosis    Spondylosis    Vitamin D  deficiency 03/27/2016    Tobacco History: Social History   Tobacco Use  Smoking Status Every Day   Current packs/day: 1.00   Average packs/day: 1 pack/day for 10.0 years (10.0 ttl pk-yrs)   Types: Cigarettes  Smokeless Tobacco Never  Tobacco Comments   Pt smokes 1.5 ppd. AB, CMA 11-29-23   Ready to quit: Not  Answered Counseling given: Not Answered Tobacco comments: Pt smokes 1.5 ppd. AB, CMA 11-29-23   Outpatient Medications Prior to Visit  Medication Sig Dispense Refill   atorvastatin  (LIPITOR) 20 MG tablet TAKE 1 TABLET BY MOUTH EVERY MORNING FOR high cholesterol 90 tablet 3   busPIRone (BUSPAR) 15 MG tablet Take 15 mg by mouth 3 (three) times daily.     clindamycin (CLEOCIN) 300 MG capsule Take 300 mg by mouth 3 (three) times daily.     COMBIVENT  RESPIMAT 20-100 MCG/ACT AERS respimat Inhale 1 puff into the lungs every 6 (six) hours as needed for wheezing. 4 g 5   Continuous Glucose Receiver (DEXCOM G7 RECEIVER) DEVI Use it to check blood glucose 3 times before meals, at bedtime and as needed. 1 each 0   Continuous Glucose Sensor (DEXCOM G7 SENSOR) MISC Use it to check blood glucose 3 times before meals, at bedtime  and as needed. 3 each 5   Cyanocobalamin  (VITAMIN B-12) 2000 MCG TBCR Take 1 tablet by mouth daily.     fluticasone  (FLONASE ) 50 MCG/ACT nasal spray Place 2 sprays into both nostrils daily. 16 g 6   furosemide (LASIX) 20 MG tablet Take 20 mg by mouth daily.     gabapentin  (NEURONTIN ) 600 MG tablet Take 600 mg by mouth 3 (three) times daily.     hydrochlorothiazide  (HYDRODIURIL ) 25 MG tablet Take 25 mg by mouth daily.     HYDROcodone -acetaminophen  (NORCO) 10-325 MG tablet Take 1 tablet by mouth daily as needed. 30 tablet 0   hydrOXYzine (ATARAX) 25 MG tablet Take 25 mg by mouth 2 (two) times daily.     insulin  aspart (NOVOLOG  FLEXPEN) 100 UNIT/ML FlexPen Use sliding scale until you follow up with your PCP and establish a regimen for meals. 15 mL 11   Insulin  Pen Needle 32G X 4 MM MISC 1 Needle by Does not apply route as needed. 30 each 1   JARDIANCE 25 MG TABS tablet TAKE 1 TABLET BY MOUTH DAILY IN THE MORNING 90 tablet 3   lamoTRIgine  (LAMICTAL ) 100 MG tablet Take 100 mg by mouth 2 (two) times daily.     LANTUS  SOLOSTAR 100 UNIT/ML Solostar Pen INJECT 50 UNITS UNDER THE SKIN AT  BEDTIME 75 mL 2   medroxyPROGESTERone  (PROVERA ) 10 MG tablet TAKE 1 TABLET BY MOUTH DAILY FOR THE first 10 DAYS of each calender MONTH 10 tablet 11   meloxicam  (MOBIC ) 15 MG tablet Take 1 tablet (15 mg total) by mouth daily. 60 tablet 1   methocarbamol  (ROBAXIN ) 750 MG tablet TAKE 1 TABLET BY MOUTH UP TO FOUR TIMES DAILY FOR THE PAIN OF FIBROMYALGIA 120 tablet 3   omeprazole (PRILOSEC) 40 MG capsule TAKE ONE CAPSULE BY MOUTH TWICE DAILY FOR acid reflux 180 capsule 3   ondansetron  (ZOFRAN ) 4 MG tablet Take 4 mg by mouth 3 (three) times daily as needed for nausea or vomiting.     prazosin (MINIPRESS) 2 MG capsule Take 2 mg by mouth at bedtime.     Prenatal Vit-Fe Fumarate-FA (PRENATAL COMPLETE PO) Take 1 tablet by mouth daily.      QUEtiapine  (SEROQUEL ) 300 MG tablet Take 300 mg by mouth at bedtime.     Semaglutide , 1 MG/DOSE, 4 MG/3ML SOPN Inject 1 mg as directed once a week. 3 mL 3   silver  sulfADIAZINE  (SILVADENE ) 1 % cream Use to areas 2-3 times daily as needed 50 g 11   SYMBICORT 160-4.5 MCG/ACT inhaler INHALE 2 PUFFS TWICE DAILY TO prevent breathing problems 10.2 g 3   topiramate  (TOPAMAX ) 100 MG tablet Take 100 mg by mouth daily.     VENTOLIN  HFA 108 (90 Base) MCG/ACT inhaler INHALE 2 PUFFS into THE lungs EVERY 4 HOURS 18 g 3   Vitamin D , Ergocalciferol , (DRISDOL) 1.25 MG (50000 UT) CAPS capsule Take 50,000 Units by mouth every 7 (seven) days.     VRAYLAR 1.5 MG capsule TAKE ONE CAPSULE BY MOUTH DAILY 90 capsule 3   No facility-administered medications prior to visit.   Review of Systems  Review of Systems  Constitutional:  Positive for fatigue.  HENT: Negative.    Respiratory: Negative.    Psychiatric/Behavioral:  Positive for sleep disturbance.    Physical Exam  BP 101/68   Pulse 95   Ht 5' 6 (1.676 m)   Wt (!) 327 lb 6.4 oz (148.5 kg)   SpO2 95% Comment: RA  BMI  52.84 kg/m  Physical Exam Constitutional:      Appearance: Normal appearance. She is obese.  HENT:      Head: Normocephalic and atraumatic.   Cardiovascular:     Rate and Rhythm: Normal rate and regular rhythm.  Pulmonary:     Effort: Pulmonary effort is normal.     Breath sounds: Normal breath sounds.   Skin:    General: Skin is warm and dry.   Neurological:     General: No focal deficit present.     Mental Status: She is alert and oriented to person, place, and time. Mental status is at baseline.   Psychiatric:        Mood and Affect: Mood normal.        Behavior: Behavior normal.        Thought Content: Thought content normal.        Judgment: Judgment normal.      Lab Results:  CBC    Component Value Date/Time   WBC 8.1 09/20/2023 1125   WBC 8.8 09/11/2022 1404   RBC 5.06 09/20/2023 1125   RBC 4.63 09/11/2022 1404   HGB 16.1 (H) 09/20/2023 1125   HCT 48.0 (H) 09/20/2023 1125   PLT 216 09/20/2023 1125   MCV 95 09/20/2023 1125   MCH 31.8 09/20/2023 1125   MCH 31.3 09/11/2022 1404   MCHC 33.5 09/20/2023 1125   MCHC 33.8 09/11/2022 1404   RDW 14.8 09/20/2023 1125   LYMPHSABS 1.8 09/20/2023 1125   MONOABS 0.5 09/11/2022 1404   EOSABS 0.2 09/20/2023 1125   BASOSABS 0.0 09/20/2023 1125    BMET    Component Value Date/Time   NA 137 11/22/2023 1505   K 4.2 11/22/2023 1505   CL 101 11/22/2023 1505   CO2 21 11/22/2023 1505   GLUCOSE 133 (H) 11/22/2023 1505   GLUCOSE 611 (HH) 09/11/2022 1404   BUN 11 11/22/2023 1505   CREATININE 1.24 (H) 11/22/2023 1505   CALCIUM  9.5 11/22/2023 1505   GFRNONAA 59 (L) 09/11/2022 1404   GFRAA >60 05/12/2019 0817    BNP    Component Value Date/Time   BNP 43.0 11/01/2018 2230    ProBNP No results found for: PROBNP  Imaging: No results found.   Assessment & Plan:   1. Obstructive sleep apnea of adult (Primary) - Ambulatory Referral for DME  Assessment and Plan    Obstructive Sleep Apnea Initially diagnosed as mild in 2009, progressed to severe in 2021. Recent sleep study indicates mild to moderate obstructive  sleep apnea with 14.5 apneic events per hour. Symptoms include daytime fatigue, frequent nighttime awakenings, and occasional gasping. CPAP therapy not previously initiated due to prescription and insurance issues. Weight loss noted, which may aid in symptom management. Discussed CPAP therapy, including the use of auto CPAP with adjustable pressure settings to prevent apneic events. Emphasized the importance of consistent use for optimal effectiveness. Explained that weight loss could potentially reduce the need for CPAP in the future, but current symptoms warrant its use. - Order auto CPAP machine 5-15cm h20. - Advise wearing CPAP during all sleep periods, including naps. - Educate on CPAP maintenance: change mask cushion and filter monthly, tubing every 3 months, headgear and water chamber every 6 months. - Instruct on cleaning CPAP components and using distilled water for humidification. - Schedule follow-up in 6-8 weeks for compliance check. - Advise her to contact office if no contact from medical supply store within 2 weeks.  Obesity Recent weight loss of 9  pounds. Continued weight loss may contribute to improved management of sleep apnea and diabetes.  Type 2 Diabetes Mellitus Currently managed with Ozempic , resulting in improved blood glucose control. Reduction in insulin  requirements noted, with discontinuation of mealtime insulin  and decrease in basal insulin  to 40 units. She reports tolerating Ozempic  well, with significant improvement in blood sugar levels.   Almarie LELON Ferrari, NP 01/23/2024

## 2024-01-05 NOTE — Progress Notes (Signed)
 Please let patient know HST showed mild-moderate OSA, schedule virtual fu to discuss results and treatment

## 2024-01-18 DIAGNOSIS — F4310 Post-traumatic stress disorder, unspecified: Secondary | ICD-10-CM | POA: Diagnosis not present

## 2024-01-18 DIAGNOSIS — F411 Generalized anxiety disorder: Secondary | ICD-10-CM | POA: Diagnosis not present

## 2024-01-18 DIAGNOSIS — F4011 Social phobia, generalized: Secondary | ICD-10-CM | POA: Diagnosis not present

## 2024-01-18 DIAGNOSIS — F3181 Bipolar II disorder: Secondary | ICD-10-CM | POA: Diagnosis not present

## 2024-01-25 DIAGNOSIS — G4733 Obstructive sleep apnea (adult) (pediatric): Secondary | ICD-10-CM | POA: Diagnosis not present

## 2024-01-31 ENCOUNTER — Telehealth: Payer: Self-pay | Admitting: Primary Care

## 2024-01-31 NOTE — Telephone Encounter (Signed)
 Copied from CRM [id number]. Topic: Appointments - Scheduling Inquiry for Clinic >> Jan 28, 2024 11:51 AM Rozanna MATSU wrote: Reason for CRM: PT CALLED TO SCHEDULE HER 6-8 WEEK CPAP FOLLOW  UP BUT NO AVAILABILTY WITH ANYONE THANKS  Patient is scheduled 03/14/24 at 4:00pm with Dr. Pleasant mail information to patient

## 2024-02-08 ENCOUNTER — Telehealth: Payer: Self-pay | Admitting: Internal Medicine

## 2024-02-08 NOTE — Telephone Encounter (Signed)
 Medical clearance form for dental procedure  Noted Copied Sleeved Original copy placed completed folder front desk until appt, per patient last time the form got lost.  Copy form placed in front desk folder

## 2024-02-10 NOTE — Telephone Encounter (Signed)
 Form in brown folder up front until appt 8/12

## 2024-02-21 ENCOUNTER — Other Ambulatory Visit: Payer: Self-pay | Admitting: Internal Medicine

## 2024-02-21 DIAGNOSIS — E1169 Type 2 diabetes mellitus with other specified complication: Secondary | ICD-10-CM

## 2024-02-22 ENCOUNTER — Ambulatory Visit: Admitting: Internal Medicine

## 2024-02-22 ENCOUNTER — Encounter: Payer: Self-pay | Admitting: Internal Medicine

## 2024-02-22 DIAGNOSIS — I10 Essential (primary) hypertension: Secondary | ICD-10-CM | POA: Diagnosis not present

## 2024-02-22 DIAGNOSIS — Z01818 Encounter for other preprocedural examination: Secondary | ICD-10-CM | POA: Diagnosis not present

## 2024-02-22 DIAGNOSIS — M51362 Other intervertebral disc degeneration, lumbar region with discogenic back pain and lower extremity pain: Secondary | ICD-10-CM

## 2024-02-22 DIAGNOSIS — Z794 Long term (current) use of insulin: Secondary | ICD-10-CM

## 2024-02-22 DIAGNOSIS — E1169 Type 2 diabetes mellitus with other specified complication: Secondary | ICD-10-CM

## 2024-02-22 DIAGNOSIS — E1142 Type 2 diabetes mellitus with diabetic polyneuropathy: Secondary | ICD-10-CM | POA: Diagnosis not present

## 2024-02-22 DIAGNOSIS — M79672 Pain in left foot: Secondary | ICD-10-CM

## 2024-02-22 MED ORDER — GABAPENTIN 600 MG PO TABS: 600.0000 mg | ORAL_TABLET | Freq: Three times a day (TID) | ORAL | 3 refills | Status: AC

## 2024-02-22 NOTE — Assessment & Plan Note (Signed)
 Has chronic back pain with radicular symptoms to bilateral LE Takes gabapentin 600 mg 3 times daily Has Norco as needed for severe pain, refilled

## 2024-02-22 NOTE — Assessment & Plan Note (Signed)
 She is planned to get dental extractions under general anesthesia Medically optimized for dental procedure under general anesthesia with moderate risk  Will need to hold Ozempic  1 week prior to dental procedure requiring general anesthesia, advised to increase dose of Lantus  to 50 units nightly when she holds Ozempic 

## 2024-02-22 NOTE — Progress Notes (Signed)
 Established Patient Office Visit  Subjective:  Patient ID: [name], female    DOB: 1976-04-23  Age: 48 y.o. MRN: [id number]  CC:  Chief Complaint  Patient presents with   Diabetes    3 month f/u    Foot Pain    Reports left foot pain.    Medical Clearance    Will be having oral surgery needs to hold off on ozempic .     HPI [name] is a 48 y.o. female with past medical history of HTN, type II DM, OSA, COPD, DDD of cervical and lumbar spine, bipolar disorder and morbid obesity who presents for f/u of her chronic medical conditions.  Type II DM with neuropathy: She has history of uncontrolled type II DM.  She takes Jardiance 25 mg QD and Lantus  40 units nightly with ISS.  She has started taking Ozempic  1 mg QW since the last visit, and has been tolerating it well.  She has not needed mealtime insulin  since starting Ozempic .  She has CGM now.  Her blood glucose averages are as follows:  7-day: 146 30-day: 139 90 days: 139  Denies any recent episode of hypoglycemia.  She has chronic fatigue, but denies polyuria or polydipsia.  She also reports chronic, intermittent numbness of bilateral LE-takes gabapentin  600 mg 3 times daily.   HTN: Her BP is WNL. She takes PRN HCTZ 25 mg once daily, but also has Lasix as PRN for leg swelling from previous PCP.  She denies any headache, dizziness, chest pain or palpitations currently.  She has chronic dyspnea, likely due to COPD and OSA.  COPD: She smokes about 1.5 pack/day, but has been trying to cut down.  She has chronic, intermittent dyspnea.  She uses Symbicort, Flovent  and as needed albuterol .  She also has Combivent  for dyspnea or wheezing.  OSA: She has been able to get CPAP and has started using it regularly for the last 1 week. She is followed by sleep  specialist.  She has noticed improvement in chronic fatigue, hypersomnolence and dyspnea.  DDD of cervical and lumbar spine: She has chronic neck and back pain with radicular  symptoms to UE and LE respectively.  She uses a rolling walker for walking support.  She takes Norco as needed for severe pain, has about 30 tablets in 3 months.  Bipolar disorder: Followed by psychiatry in [place].  She is on Vraylar 1.5 mg QD, Seroquel  300 mg nightly, prazosin 2 mg nightly, Lamictal  100 mg twice daily and BuSpar 15 mg 3 times daily currently.  Left foot pain: She reports left foot pain in the arch area for the last 1 month. Pain is constant, dull, worse upon standing or walking.  Denies any recent injury.  Past Medical History:  Diagnosis Date   ADHD    Agoraphobia    Anxiety    Asthma    Bipolar disorder (HCC)    Concussion    DDD (degenerative disc disease), lumbar    Fibromyalgia    GERD (gastroesophageal reflux disease)    History of pneumonia 2015   History of substance abuse (HCC)    Hypertension    Migraine    Mixed hyperlipidemia    Morbid obesity (HCC)    Neuropathy    OSA (obstructive sleep apnea)    Plantar fascial fibromatosis    Spondylosis    Vitamin D  deficiency 03/27/2016    Past Surgical History:  Procedure Laterality Date   CYST REMOVAL HAND Left  ESOPHAGEAL DILATION     ESOPHAGOGASTRODUODENOSCOPY  2006   Dr. Rehman:small hh, esophageal dilation.   ESOPHAGOGASTRODUODENOSCOPY (EGD) WITH PROPOFOL  N/A 05/15/2019   Dr. Sander: Esophagitis with circumferential erosions or excoriations involving 10 cm segment of mid esophagus with overlying exudate.  Most proximal and distal esophagus appeared normal.  Biopsies revealed inflammation only.  Esophagus dilated.   FOOT SURGERY Right    'knot removed   MALONEY DILATION N/A 05/15/2019   Procedure: MALONEY DILATION;  Surgeon: Shaaron Lamar HERO, MD;  Location: [facility];  Service: Endoscopy;  Laterality: N/A;   MULTIPLE EXTRACTIONS WITH ALVEOLOPLASTY N/A 05/29/2016   Procedure: MULTIPLE EXTRACTIONS;  Surgeon: Glendia Primrose, DDS;  Location: [facility] OR;  Service: Oral Surgery;  Laterality: N/A;   WISDOM  TOOTH EXTRACTION      Family History  Problem Relation Age of Onset   Cancer Maternal Grandmother        lung, back   Diabetes Paternal Grandmother    Heart disease Paternal Grandmother    Diabetes Paternal Grandfather    Heart disease Paternal Grandfather    Stroke Paternal Grandfather     Social History   Socioeconomic History   Marital status: Single    Spouse name: Not on file   Number of children: Not on file   Years of education: Not on file   Highest education level: Associate degree: academic program  Occupational History   Not on file  Tobacco Use   Smoking status: Every Day    Current packs/day: 1.00    Average packs/day: 1 pack/day for 10.0 years (10.0 ttl pk-yrs)    Types: Cigarettes   Smokeless tobacco: Never   Tobacco comments:    Pt smokes 1.5 ppd. AB, CMA 11-29-23  Vaping Use   Vaping status: Never Used  Substance and Sexual Activity   Alcohol use: Not Currently   Drug use: Not Currently    Types: Cocaine   Sexual activity: Not Currently    Partners: Male    Birth control/protection: None, Abstinence  Other Topics Concern   Not on file  Social History Narrative   Not on file   Social Drivers of Health   Financial Resource Strain: Low Risk  (02/21/2024)   Overall Financial Resource Strain (CARDIA)    Difficulty of Paying Living Expenses: Not hard at all  Food Insecurity: No Food Insecurity (02/21/2024)   Hunger Vital Sign    Worried About Running Out of Food in the Last Year: Never true    Ran Out of Food in the Last Year: Never true  Transportation Needs: No Transportation Needs (02/21/2024)   PRAPARE - Administrator, Civil Service (Medical): No    Lack of Transportation (Non-Medical): No  Physical Activity: Inactive (02/21/2024)   Exercise Vital Sign    Days of Exercise per Week: 0 days    Minutes of Exercise per Session: Not on file  Stress: No Stress Concern Present (02/21/2024)   [employer] of Occupational Health -  Occupational Stress Questionnaire    Feeling of Stress: Not at all  Social Connections: Socially Isolated (02/21/2024)   Social Connection and Isolation Panel    Frequency of Communication with Friends and Family: Once a week    Frequency of Social Gatherings with Friends and Family: Once a week    Attends Religious Services: Never    Database administrator or Organizations: No    Attends Engineer, structural: Not on file    Marital Status:  Never married  Intimate Partner Violence: Not At Risk (11/13/2021)   Humiliation, Afraid, Rape, and Kick questionnaire    Fear of Current or Ex-Partner: No    Emotionally Abused: No    Physically Abused: No    Sexually Abused: No    ROS Review of Systems  Constitutional:  Positive for fatigue. Negative for chills and fever.  HENT:  Positive for congestion. Negative for sore throat.   Eyes:  Negative for pain and discharge.  Respiratory:  Positive for shortness of breath (Intermittent). Negative for cough.   Cardiovascular:  Negative for chest pain and palpitations.  Gastrointestinal:  Negative for abdominal pain, diarrhea, nausea and vomiting.  Endocrine: Negative for polydipsia and polyuria.  Genitourinary:  Negative for dysuria and hematuria.  Musculoskeletal:  Positive for arthralgias, back pain and neck pain. Negative for neck stiffness.  Skin:  Negative for rash.  Neurological:  Negative for dizziness and weakness.  Psychiatric/Behavioral:  Positive for sleep disturbance. Negative for agitation and behavioral problems. The patient is nervous/anxious.     Objective:   Today's Vitals: There were no vitals taken for this visit.  Physical Exam Vitals reviewed.  Constitutional:      General: She is not in acute distress.    Appearance: She is obese. She is not diaphoretic.     Comments: Has a rolling walker  HENT:     Head: Normocephalic and atraumatic.     Nose: Nose normal.     Mouth/Throat:     Mouth: Mucous membranes are  moist.  Eyes:     General: No scleral icterus.    Extraocular Movements: Extraocular movements intact.  Cardiovascular:     Rate and Rhythm: Normal rate and regular rhythm.     Heart sounds: Normal heart sounds. No murmur heard. Pulmonary:     Breath sounds: Normal breath sounds. No wheezing or rales.  Musculoskeletal:     Cervical back: Neck supple. No tenderness.     Right lower leg: No edema.     Left lower leg: No edema.  Feet:     Comments: Tenderness over sole area near 3rd and 4th toe Skin:    General: Skin is warm.     Findings: No rash.  Neurological:     General: No focal deficit present.     Mental Status: She is alert and oriented to person, place, and time.     Sensory: Sensory deficit (B/l LE) present.     Motor: Weakness (B/l LE - 4/5) present.  Psychiatric:        Mood and Affect: Mood normal.        Behavior: Behavior normal.     Assessment & Plan:   Problem List Items Addressed This Visit       Cardiovascular and Mediastinum   Essential hypertension   BP Readings from Last 1 Encounters:  01/05/24 101/68   Well-controlled,takes hydrochlorothiazide  25 mg QD PRN DC Lasix 20 mg Counseled for compliance with the medications Advised DASH diet and moderate exercise/walking as tolerated        Endocrine   Type 2 diabetes mellitus with other specified complication (HCC) - Primary   Lab Results  Component Value Date   HGBA1C 7.6 (H) 11/22/2023    Well-controlled currently, significantly improved compared to prior Associated with HTN, HLD and neuropathy CGM data reviewed - 95% in-range with no severe hyperglycemia, has not required ISS since starting Ozempic  On Lantus  40 units nightly with ISS (150-200: 4 U, 201-250: 6  U, 251-300: 8 U, 301-350: 10 U, 351-400: 12 U, 400 and above: 14 U and call) On Ozempic  1 mg QW and Jardiance 25 mg once daily Metformin was not considered due to her declining kidney function and other psychiatric medication  interactions in the past  Checks blood glucose 3 times daily before meals and at bedtime, continues to benefit from CGM for better glycemic profile and regular insulin  dosages - has Dexcom G7 Advised to follow diabetic diet On statin F/u CMP and lipid panel Diabetic eye exam: Advised to follow up with Ophthalmology for diabetic eye exam  On gabapentin  600 mg 3 times daily for DM neuropathy and chronic neck, back pain      Relevant Orders   Hemoglobin A1c   Basic Metabolic Panel (BMET)   Diabetic polyneuropathy associated with type 2 diabetes mellitus (HCC)   On gabapentin  600 mg 3 times daily, refilled per patient request      Relevant Medications   gabapentin  (NEURONTIN ) 600 MG tablet   Other Relevant Orders   Basic Metabolic Panel (BMET)   CBC with Differential/Platelet     Musculoskeletal and Integument   Degeneration of intervertebral disc of lumbar region with discogenic back pain and lower extremity pain   Has chronic back pain with radicular symptoms to bilateral LE Takes gabapentin  600 mg 3 times daily Has Norco as needed for severe pain, refilled        Other   Left foot pain   For the last 1 month Likely has plantar fasciitis versus inadequate arch support Advised to wear plantar fasciitis arch support shoe insert Has seen podiatry - advised to contact if persistent pain      Preoperative examination   She is planned to get dental extractions under general anesthesia Medically optimized for dental procedure under general anesthesia with moderate risk  Will need to hold Ozempic  1 week prior to dental procedure requiring general anesthesia, advised to increase dose of Lantus  to 50 units nightly when she holds Ozempic          Outpatient Encounter Medications as of 02/22/2024  Medication Sig   atorvastatin  (LIPITOR) 20 MG tablet TAKE 1 TABLET BY MOUTH EVERY MORNING FOR high cholesterol   busPIRone (BUSPAR) 15 MG tablet Take 15 mg by mouth 3 (three) times  daily.   clindamycin (CLEOCIN) 300 MG capsule Take 300 mg by mouth 3 (three) times daily.   COMBIVENT  RESPIMAT 20-100 MCG/ACT AERS respimat Inhale 1 puff into the lungs every 6 (six) hours as needed for wheezing.   Continuous Glucose Receiver (DEXCOM G7 RECEIVER) DEVI Use it to check blood glucose 3 times before meals, at bedtime and as needed.   Continuous Glucose Sensor (DEXCOM G7 SENSOR) MISC USE AS DIRECTED TO MONITOR BLOOD SUGAR LEVELS DAILY. CHANGE SENSOR EVERY 10 DAYS. check levels BEFORE MEALS AND AT BEDTIME AS NEEDED   Cyanocobalamin  (VITAMIN B-12) 2000 MCG TBCR Take 1 tablet by mouth daily.   fluticasone  (FLONASE ) 50 MCG/ACT nasal spray Place 2 sprays into both nostrils daily.   hydrochlorothiazide  (HYDRODIURIL ) 25 MG tablet Take 25 mg by mouth daily as needed (Leg swelling).   HYDROcodone -acetaminophen  (NORCO) 10-325 MG tablet Take 1 tablet by mouth daily as needed.   hydrOXYzine (ATARAX) 25 MG tablet Take 25 mg by mouth 2 (two) times daily.   insulin  aspart (NOVOLOG  FLEXPEN) 100 UNIT/ML FlexPen Use sliding scale until you follow up with your PCP and establish a regimen for meals.   Insulin  Pen Needle 32G X 4  MM MISC 1 Needle by Does not apply route as needed.   JARDIANCE 25 MG TABS tablet TAKE 1 TABLET BY MOUTH DAILY IN THE MORNING   lamoTRIgine  (LAMICTAL ) 100 MG tablet Take 100 mg by mouth 2 (two) times daily.   LANTUS  SOLOSTAR 100 UNIT/ML Solostar Pen INJECT 50 UNITS UNDER THE SKIN AT BEDTIME   medroxyPROGESTERone  (PROVERA ) 10 MG tablet TAKE 1 TABLET BY MOUTH DAILY FOR THE first 10 DAYS of each calender MONTH   methocarbamol  (ROBAXIN ) 750 MG tablet TAKE 1 TABLET BY MOUTH UP TO FOUR TIMES DAILY FOR THE PAIN OF FIBROMYALGIA   omeprazole (PRILOSEC) 40 MG capsule TAKE ONE CAPSULE BY MOUTH TWICE DAILY FOR acid reflux   ondansetron  (ZOFRAN ) 4 MG tablet Take 4 mg by mouth 3 (three) times daily as needed for nausea or vomiting.   prazosin (MINIPRESS) 2 MG capsule Take 2 mg by mouth at  bedtime.   Prenatal Vit-Fe Fumarate-FA (PRENATAL COMPLETE PO) Take 1 tablet by mouth daily.    QUEtiapine  (SEROQUEL ) 300 MG tablet Take 300 mg by mouth at bedtime.   Semaglutide , 1 MG/DOSE, 4 MG/3ML SOPN Inject 1 mg as directed once a week.   silver  sulfADIAZINE  (SILVADENE ) 1 % cream Use to areas 2-3 times daily as needed   SYMBICORT 160-4.5 MCG/ACT inhaler INHALE 2 PUFFS TWICE DAILY TO prevent breathing problems   topiramate  (TOPAMAX ) 100 MG tablet Take 100 mg by mouth daily.   VENTOLIN  HFA 108 (90 Base) MCG/ACT inhaler INHALE 2 PUFFS into THE lungs EVERY 4 HOURS   Vitamin D , Ergocalciferol , (DRISDOL) 1.25 MG (50000 UT) CAPS capsule Take 50,000 Units by mouth every 7 (seven) days.   VRAYLAR 1.5 MG capsule TAKE ONE CAPSULE BY MOUTH DAILY   [DISCONTINUED] furosemide (LASIX) 20 MG tablet Take 20 mg by mouth daily.   [DISCONTINUED] gabapentin  (NEURONTIN ) 600 MG tablet Take 600 mg by mouth 3 (three) times daily.   gabapentin  (NEURONTIN ) 600 MG tablet Take 1 tablet (600 mg total) by mouth 3 (three) times daily.   [DISCONTINUED] meloxicam  (MOBIC ) 15 MG tablet Take 1 tablet (15 mg total) by mouth daily.   No facility-administered encounter medications on file as of 02/22/2024.    Follow-up: Return in about 4 months (around 06/23/2024) for DM.   Suzzane MARLA Blanch, MD

## 2024-02-22 NOTE — Assessment & Plan Note (Addendum)
 Lab Results  Component Value Date   HGBA1C 7.6 (H) 11/22/2023    Well-controlled currently, significantly improved compared to prior Associated with HTN, HLD and neuropathy CGM data reviewed - 95% in-range with no severe hyperglycemia, has not required ISS since starting Ozempic  On Lantus  40 units nightly with ISS (150-200: 4 U, 201-250: 6 U, 251-300: 8 U, 301-350: 10 U, 351-400: 12 U, 400 and above: 14 U and call) On Ozempic  1 mg QW and Jardiance 25 mg once daily Metformin was not considered due to her declining kidney function and other psychiatric medication interactions in the past  Checks blood glucose 3 times daily before meals and at bedtime, continues to benefit from CGM for better glycemic profile and regular insulin  dosages - has Dexcom G7 Advised to follow diabetic diet On statin F/u CMP and lipid panel Diabetic eye exam: Advised to follow up with Ophthalmology for diabetic eye exam  On gabapentin  600 mg 3 times daily for DM neuropathy and chronic neck, back pain

## 2024-02-22 NOTE — Assessment & Plan Note (Addendum)
 On gabapentin  600 mg 3 times daily, refilled per patient request

## 2024-02-22 NOTE — Assessment & Plan Note (Addendum)
 BP Readings from Last 1 Encounters:  01/05/24 101/68   Well-controlled,takes hydrochlorothiazide  25 mg QD PRN DC Lasix 20 mg Counseled for compliance with the medications Advised DASH diet and moderate exercise/walking as tolerated

## 2024-02-22 NOTE — Assessment & Plan Note (Addendum)
 For the last 1 month Likely has plantar fasciitis versus inadequate arch support Advised to wear plantar fasciitis arch support shoe insert Has seen podiatry - advised to contact if persistent pain

## 2024-02-22 NOTE — Patient Instructions (Signed)
 Please use shoe insert for better arch support as discussed.  Please hold Ozempic  1 week prior to dental procedure. Please take Lantus  50 Units in that week.  Please continue to take medications as prescribed.  Please continue to follow low carb diet and perform moderate exercise/walking as tolerated.

## 2024-02-23 ENCOUNTER — Ambulatory Visit: Payer: Self-pay | Admitting: Internal Medicine

## 2024-02-23 LAB — BASIC METABOLIC PANEL WITH GFR
BUN/Creatinine Ratio: 11 (ref 9–23)
BUN: 14 mg/dL (ref 6–24)
CO2: 20 mmol/L (ref 20–29)
Calcium: 9.6 mg/dL (ref 8.7–10.2)
Chloride: 103 mmol/L (ref 96–106)
Creatinine, Ser: 1.22 mg/dL — ABNORMAL HIGH (ref 0.57–1.00)
Glucose: 101 mg/dL — ABNORMAL HIGH (ref 70–99)
Potassium: 4.2 mmol/L (ref 3.5–5.2)
Sodium: 140 mmol/L (ref 134–144)
eGFR: 55 mL/min/1.73 — ABNORMAL LOW (ref >59)

## 2024-02-23 LAB — CBC WITH DIFFERENTIAL/PLATELET
Basophils Absolute: 0 x10E3/uL (ref 0.0–0.2)
Basos: 0 %
EOS (ABSOLUTE): 0.2 x10E3/uL (ref 0.0–0.4)
Eos: 2 %
Hematocrit: 44.9 % (ref 34.0–46.6)
Hemoglobin: 15.2 g/dL (ref 11.1–15.9)
Immature Grans (Abs): 0 x10E3/uL (ref 0.0–0.1)
Immature Granulocytes: 0 %
Lymphocytes Absolute: 2.4 x10E3/uL (ref 0.7–3.1)
Lymphs: 21 %
MCH: 32.5 pg (ref 26.6–33.0)
MCHC: 33.9 g/dL (ref 31.5–35.7)
MCV: 96 fL (ref 79–97)
Monocytes Absolute: 0.7 x10E3/uL (ref 0.1–0.9)
Monocytes: 6 %
Neutrophils Absolute: 8.1 x10E3/uL — ABNORMAL HIGH (ref 1.4–7.0)
Neutrophils: 71 %
Platelets: 202 x10E3/uL (ref 150–450)
RBC: 4.68 x10E6/uL (ref 3.77–5.28)
RDW: 14.8 % (ref 11.7–15.4)
WBC: 11.5 x10E3/uL — ABNORMAL HIGH (ref 3.4–10.8)

## 2024-02-23 LAB — HEMOGLOBIN A1C
Est. average glucose Bld gHb Est-mCnc: 134 mg/dL
Hgb A1c MFr Bld: 6.3 % — ABNORMAL HIGH (ref 4.8–5.6)

## 2024-02-25 DIAGNOSIS — G4733 Obstructive sleep apnea (adult) (pediatric): Secondary | ICD-10-CM | POA: Diagnosis not present

## 2024-02-29 DIAGNOSIS — I10 Essential (primary) hypertension: Secondary | ICD-10-CM | POA: Diagnosis not present

## 2024-02-29 DIAGNOSIS — G4733 Obstructive sleep apnea (adult) (pediatric): Secondary | ICD-10-CM | POA: Diagnosis not present

## 2024-03-08 ENCOUNTER — Telehealth: Payer: Self-pay | Admitting: Pulmonary Disease

## 2024-03-08 NOTE — Telephone Encounter (Signed)
 LVM for patient regarding appointment time change for Tuesday 03/14/24---appointment time moved from 4:00 pm to 3:30 pm---requested return confirmation call-

## 2024-03-09 ENCOUNTER — Encounter: Payer: Self-pay | Admitting: Gastroenterology

## 2024-03-09 ENCOUNTER — Other Ambulatory Visit: Payer: Self-pay | Admitting: Internal Medicine

## 2024-03-14 ENCOUNTER — Encounter: Payer: Self-pay | Admitting: Pulmonary Disease

## 2024-03-14 ENCOUNTER — Ambulatory Visit: Admitting: Primary Care

## 2024-03-14 ENCOUNTER — Ambulatory Visit: Admitting: Pulmonary Disease

## 2024-03-14 VITALS — BP 100/67 | HR 96 | Ht 66.0 in | Wt 322.6 lb

## 2024-03-14 DIAGNOSIS — F17210 Nicotine dependence, cigarettes, uncomplicated: Secondary | ICD-10-CM | POA: Diagnosis not present

## 2024-03-14 DIAGNOSIS — G4733 Obstructive sleep apnea (adult) (pediatric): Secondary | ICD-10-CM | POA: Diagnosis not present

## 2024-03-14 DIAGNOSIS — J449 Chronic obstructive pulmonary disease, unspecified: Secondary | ICD-10-CM | POA: Diagnosis not present

## 2024-03-14 MED ORDER — STIOLTO RESPIMAT 2.5-2.5 MCG/ACT IN AERS: 2.0000 | INHALATION_SPRAY | Freq: Every day | RESPIRATORY_TRACT | 6 refills | Status: AC

## 2024-03-14 MED ORDER — NICOTINE POLACRILEX 4 MG MT LOZG: LOZENGE | OROMUCOSAL | 0 refills | Status: DC

## 2024-03-14 MED ORDER — NICOTINE 21 MG/24HR TD PT24: MEDICATED_PATCH | TRANSDERMAL | 0 refills | Status: DC

## 2024-03-14 MED ORDER — ALBUTEROL SULFATE (2.5 MG/3ML) 0.083% IN NEBU: 2.5000 mg | INHALATION_SOLUTION | Freq: Four times a day (QID) | RESPIRATORY_TRACT | 5 refills | Status: AC | PRN

## 2024-03-14 NOTE — Assessment & Plan Note (Signed)
 The patient has an extensive hx of smoking, 30 PY. CT C spine in 2022 shows mild upper lobe emphysematous changes. MMRC > 2, CAT score > 10, has 2-3 OP exacerbations per year. She is currently on ICS/LABA with Symbicort. I recommended switching to LAMA/LABA with Stiolto. I also submitted an order for nebulizer machine. I also referred her to pulmonary rehab. We will update her PFTs.

## 2024-03-14 NOTE — Patient Instructions (Signed)
 1- STOP Symbicort and use Stiolto 2 puffs in the AM 2- I submitted an order for nebulizer machine. You can use albuterol  nebulizer every 4-6 hours as needed whenever short of breath or wheezing 3- Start doing 1 nicotine  patch a day and lozenges every 2 hours as needed for cigarette cravings 4- If lozenges are expensive, call [phone] QUIT NOW and request them 5- Continue CPAP  6- Please start working on weight loss, agree with Ozempic 

## 2024-03-14 NOTE — Progress Notes (Signed)
 Established Patient Pulmonology Office Visit   Subjective:  Patient ID: [name], female    DOB: 08-22-1975  MRN: [id number]  CC:  Chief Complaint  Patient presents with   Sleep Apnea    F/u on cpap     HPI Ms. Cheese is a 48 y/o F with a PMH significant for bipolar disorder, DM, GERD, COPD?, and obstructive sleep apnea who presents for follow up.  She was seen by NP Hope and started on autoCPAP 5-15 cm H2O. She is coming back for CPAP check.  Home sleep testing completed 11/2023, AHI 15 suggestive of moderate OSA  #OSA: Going well. Sleeps around 7-10 hours a night every night Full face mask No oxygen with it Sleep has improved No snoring More refreshed in the AM Fatigue is less than before Napping less than before and shorter than before She sleeps less in total Don't wake up as much Used to sleep 12 hours but now sleeps around 8 hours Gets up less No problems with machine Compliance report shows 100% compliance nightly and 93% for at least 4 hours, AHI is 0.1  #COPD: - Symbicort 2 puffs BID - Combivent  every 6 hours - COPD exacerbations: 2-3 times a year - MMRC > 2 - CAT > 10 - Smokes 2 pack per day x 30 years = 60 PY   ROS    Current Outpatient Medications:    albuterol  (PROVENTIL ) (2.5 MG/3ML) 0.083% nebulizer solution, Take 3 mLs (2.5 mg total) by nebulization every 6 (six) hours as needed for wheezing or shortness of breath., Disp: 75 mL, Rfl: 5   atorvastatin  (LIPITOR) 20 MG tablet, TAKE 1 TABLET BY MOUTH EVERY MORNING FOR high cholesterol, Disp: 90 tablet, Rfl: 3   busPIRone (BUSPAR) 15 MG tablet, Take 15 mg by mouth 3 (three) times daily., Disp: , Rfl:    clindamycin (CLEOCIN) 300 MG capsule, Take 300 mg by mouth 3 (three) times daily., Disp: , Rfl:    COMBIVENT  RESPIMAT 20-100 MCG/ACT AERS respimat, Inhale 1 puff into the lungs every 6 (six) hours as needed for wheezing., Disp: 4 g, Rfl: 5   Continuous Glucose Receiver (DEXCOM G7 RECEIVER) DEVI,  Use it to check blood glucose 3 times before meals, at bedtime and as needed., Disp: 1 each, Rfl: 0   Continuous Glucose Sensor (DEXCOM G7 SENSOR) MISC, USE AS DIRECTED TO MONITOR BLOOD SUGAR LEVELS DAILY. CHANGE SENSOR EVERY 10 DAYS. check levels BEFORE MEALS AND AT BEDTIME AS NEEDED, Disp: 3 each, Rfl: 5   Cyanocobalamin  (VITAMIN B-12) 2000 MCG TBCR, Take 1 tablet by mouth daily., Disp: , Rfl:    fluticasone  (FLONASE ) 50 MCG/ACT nasal spray, Place 2 sprays into both nostrils daily., Disp: 16 g, Rfl: 6   gabapentin  (NEURONTIN ) 600 MG tablet, Take 1 tablet (600 mg total) by mouth 3 (three) times daily., Disp: 270 tablet, Rfl: 3   hydrochlorothiazide  (HYDRODIURIL ) 25 MG tablet, Take 25 mg by mouth daily as needed (Leg swelling)., Disp: , Rfl:    HYDROcodone -acetaminophen  (NORCO) 10-325 MG tablet, Take 1 tablet by mouth daily as needed., Disp: 30 tablet, Rfl: 0   hydrOXYzine (ATARAX) 25 MG tablet, Take 25 mg by mouth 2 (two) times daily., Disp: , Rfl:    insulin  aspart (NOVOLOG  FLEXPEN) 100 UNIT/ML FlexPen, Use sliding scale until you follow up with your PCP and establish a regimen for meals., Disp: 15 mL, Rfl: 11   Insulin  Pen Needle 32G X 4 MM MISC, 1 Needle by Does not  apply route as needed., Disp: 30 each, Rfl: 1   JARDIANCE 25 MG TABS tablet, TAKE 1 TABLET BY MOUTH DAILY IN THE MORNING, Disp: 90 tablet, Rfl: 3   lamoTRIgine  (LAMICTAL ) 100 MG tablet, Take 100 mg by mouth 2 (two) times daily., Disp: , Rfl:    LANTUS  SOLOSTAR 100 UNIT/ML Solostar Pen, INJECT 50 UNITS UNDER THE SKIN AT BEDTIME, Disp: 75 mL, Rfl: 2   medroxyPROGESTERone  (PROVERA ) 10 MG tablet, TAKE 1 TABLET BY MOUTH DAILY FOR THE first 10 DAYS of each calender MONTH, Disp: 10 tablet, Rfl: 11   methocarbamol  (ROBAXIN ) 750 MG tablet, TAKE 1 TABLET BY MOUTH UP TO FOUR TIMES DAILY FOR THE PAIN OF FIBROMYALGIA, Disp: 120 tablet, Rfl: 3   nicotine  (NICODERM CQ  - DOSED IN MG/24 HOURS) 21 mg/24hr patch, RX #1 Weeks 1-4: 21 mg x 1 patch daily.  Wear for 24 hours. If you have sleep disturbances, remove at bedtime., Disp: 28 patch, Rfl: 0   nicotine  polacrilex (COMMIT) 4 MG lozenge, RX #1 Weeks 1-6: 1 lozenge every 1-2 hours. Use at least 9 lozenges per day for the first 6 weeks. Max 20 lozenges per day., Disp: 1008 lozenge, Rfl: 0   omeprazole (PRILOSEC) 40 MG capsule, TAKE ONE CAPSULE BY MOUTH TWICE DAILY FOR acid reflux, Disp: 180 capsule, Rfl: 3   ondansetron  (ZOFRAN ) 4 MG tablet, Take 4 mg by mouth 3 (three) times daily as needed for nausea or vomiting., Disp: , Rfl:    prazosin (MINIPRESS) 2 MG capsule, Take 2 mg by mouth at bedtime., Disp: , Rfl:    Prenatal Vit-Fe Fumarate-FA (PRENATAL COMPLETE PO), Take 1 tablet by mouth daily. , Disp: , Rfl:    QUEtiapine  (SEROQUEL ) 300 MG tablet, Take 300 mg by mouth at bedtime., Disp: , Rfl:    Semaglutide , 1 MG/DOSE, 4 MG/3ML SOPN, Inject 1 mg as directed once a week., Disp: 3 mL, Rfl: 3   silver  sulfADIAZINE  (SILVADENE ) 1 % cream, Use to areas 2-3 times daily as needed, Disp: 50 g, Rfl: 11   Tiotropium Bromide-Olodaterol (STIOLTO RESPIMAT ) 2.5-2.5 MCG/ACT AERS, Inhale 2 puffs into the lungs daily., Disp: 1 each, Rfl: 6   topiramate  (TOPAMAX ) 100 MG tablet, Take 100 mg by mouth daily., Disp: , Rfl:    Vitamin D , Ergocalciferol , (DRISDOL ) 1.25 MG (50000 UT) CAPS capsule, Take 50,000 Units by mouth every 7 (seven) days., Disp: , Rfl:    VRAYLAR 1.5 MG capsule, TAKE ONE CAPSULE BY MOUTH DAILY, Disp: 90 capsule, Rfl: 3      Objective:  BP 100/67   Pulse 96   Ht 5' 6 (1.676 m)   Wt (!) 322 lb 9.6 oz (146.3 kg)   SpO2 97% Comment: ra  BMI 52.07 kg/m  Wt Readings from Last 3 Encounters:  03/14/24 (!) 322 lb 9.6 oz (146.3 kg)  01/05/24 (!) 327 lb 6.4 oz (148.5 kg)  12/01/23 (!) 333 lb 3.2 oz (151.1 kg)   BMI Readings from Last 3 Encounters:  03/14/24 52.07 kg/m  01/05/24 52.84 kg/m  12/01/23 53.78 kg/m   SpO2 Readings from Last 3 Encounters:  03/14/24 97%  01/05/24 95%  11/29/23  93%    Physical Exam General: NAD, alert, WD, WN Eyes: PERRL, no scleral icterus ENMT: oropharynx clear, good dentition, no oral lesions, mallampati score I Skin: warm, intact, no rashes Neck: JVD difficult to assess due to body habitus, ROM and lymph node assessment normal CV: RRR, no MRG, nl S1 and S2, no peripheral edema Resp:  diffuse expiratory wheezing Abdom: Normoactive bowel sounds, soft, nontender, nondistended, no hepatosplenomegaly Neuro: Awake alert oriented to person place time and situation  Diagnostic Review:  Last CBC Lab Results  Component Value Date   WBC 11.5 (H) 02/22/2024   HGB 15.2 02/22/2024   HCT 44.9 02/22/2024   MCV 96 02/22/2024   MCH 32.5 02/22/2024   RDW 14.8 02/22/2024   PLT 202 02/22/2024   Last metabolic panel Lab Results  Component Value Date   GLUCOSE 101 (H) 02/22/2024   NA 140 02/22/2024   K 4.2 02/22/2024   CL 103 02/22/2024   CO2 20 02/22/2024   BUN 14 02/22/2024   CREATININE 1.22 (H) 02/22/2024   EGFR 55 (L) 02/22/2024   CALCIUM  9.6 02/22/2024   PROT 7.0 11/22/2023   ALBUMIN 4.4 11/22/2023   LABGLOB 2.6 11/22/2023   AGRATIO 1.6 03/19/2016   BILITOT 0.3 11/22/2023   ALKPHOS 157 (H) 11/22/2023   AST 15 11/22/2023   ALT 19 11/22/2023   ANIONGAP 10 09/11/2022   Absolute Eos Count 200 cells/uL  No PFTs on file  CT C Spine 2022 shows mild upper lobe emphysematous changes.    Assessment & Plan:   Assessment & Plan Obstructive sleep apnea of adult The patient has an established diagnosis of moderate OSA with AHI of 15. She is here for compliance check. She is a 100% compliant with AHI of 0.1. She reports marked improvement in her symptoms including better sleep quality, duration, and improvement in daytime somnolence. The patient will continue with autoCPAP 5-15. Chronic obstructive pulmonary disease, unspecified COPD type (HCC) The patient has an extensive hx of smoking, 30 PY. CT C spine in 2022 shows mild upper lobe  emphysematous changes. MMRC > 2, CAT score > 10, has 2-3 OP exacerbations per year. She is currently on ICS/LABA with Symbicort. I recommended switching to LAMA/LABA with Stiolto. I also submitted an order for nebulizer machine. I also referred her to pulmonary rehab. We will update her PFTs. Cigarette nicotine  dependence without complication Smoking assessment and cessation counseling I spent 10 minutes discussing the continuing health risks associated with smoking. I offered the patient tobacco cessation treatment options such as medication and behavioral interventions to help facilitate smoking cessation. I do not advise e-cigarettes as a form of stopping smoking. I have informed the patient that we can assist and have options of nicotine  replacement therapy, provided smoking cessation education today, provided smoking cessation counseling, and provided cessation resources.  Smoking cessation counseling advised for: CPT 99406 3 to 10 minutes  Morbid obesity (HCC) Discussed with the patient about the importance of maintaining a healthy weight and the positive impact it can have on lung function. Encouraged them to reduce caloric intake and increase activity.  Orders Placed This Encounter  Procedures   AMB referral to pulmonary rehabilitation   AMB REFERRAL FOR DME   Pulmonary function test   I spent 41 minutes reviewing patient's chart including prior consultant notes, imaging, and PFTs as well as face-to-face with the patient, over half in discussion of the diagnosis and the importance of compliance with the treatment plan.  Return in about 4 months (around 07/14/2024).   Micala Saltsman, MD

## 2024-03-14 NOTE — Assessment & Plan Note (Signed)
 The patient has an established diagnosis of moderate OSA with AHI of 15. She is here for compliance check. She is a 100% compliant with AHI of 0.1. She reports marked improvement in her symptoms including better sleep quality, duration, and improvement in daytime somnolence. The patient will continue with autoCPAP 5-15.

## 2024-03-14 NOTE — Assessment & Plan Note (Signed)
 Discussed with the patient about the importance of maintaining a healthy weight and the positive impact it can have on lung function. Encouraged them to reduce caloric intake and increase activity.

## 2024-03-15 ENCOUNTER — Telehealth: Payer: Self-pay | Admitting: Pulmonary Disease

## 2024-03-15 NOTE — Telephone Encounter (Signed)
 LVM for patient regarding the Tuesday 05/16/24 2:00 pm PFT appointment at [facility] time is 1:45 pm---1st floor registration desk for check in---will mail information to patient and requested return call with questions

## 2024-03-16 ENCOUNTER — Other Ambulatory Visit: Payer: Self-pay | Admitting: Internal Medicine

## 2024-03-16 DIAGNOSIS — M51362 Other intervertebral disc degeneration, lumbar region with discogenic back pain and lower extremity pain: Secondary | ICD-10-CM

## 2024-03-16 DIAGNOSIS — E559 Vitamin D deficiency, unspecified: Secondary | ICD-10-CM

## 2024-03-16 DIAGNOSIS — M5010 Cervical disc disorder with radiculopathy, unspecified cervical region: Secondary | ICD-10-CM

## 2024-03-16 MED ORDER — VITAMIN D (ERGOCALCIFEROL) 1.25 MG (50000 UNIT) PO CAPS: 50000.0000 [IU] | ORAL_CAPSULE | ORAL | 1 refills | Status: DC

## 2024-03-16 NOTE — Telephone Encounter (Signed)
 Copied from CRM [id number]. Topic: Clinical - Medication Refill >> Mar 16, 2024  2:47 PM Marissa P wrote: Medication: Vitamin D , Ergocalciferol , (DRISDOL ) 1.25 MG (50000 UT) CAPS capsule  Has the patient contacted their pharmacy? Yes (Agent: If no, request that the patient contact the pharmacy for the refill. If patient does not wish to contact the pharmacy document the reason why and proceed with request.) (Agent: If yes, when and what did the pharmacy advise?)  This is the patient's preferred pharmacy:  [facility] - [place], [place] - [address] [place] [address] Phone: [phone] Fax: [phone]  Is this the correct pharmacy for this prescription? Yes If no, delete pharmacy and type the correct one.   Has the prescription been filled recently? Yes  Is the patient out of the medication? Yes  Has the patient been seen for an appointment in the last year OR does the patient have an upcoming appointment? Yes  Can we respond through MyChart? Yes  Agent: Please be advised that Rx refills may take up to 3 business days. We ask that you follow-up with your pharmacy.

## 2024-03-20 ENCOUNTER — Other Ambulatory Visit: Payer: Self-pay | Admitting: Internal Medicine

## 2024-03-20 ENCOUNTER — Telehealth: Payer: Self-pay

## 2024-03-20 DIAGNOSIS — E1169 Type 2 diabetes mellitus with other specified complication: Secondary | ICD-10-CM

## 2024-03-20 NOTE — Telephone Encounter (Signed)
 Correction, CMN is for nebulizer kit, not CPAP supplies.

## 2024-03-20 NOTE — Telephone Encounter (Signed)
 CMN received by [facility] regarding pt's CPAP supplies. This can be signed by Almarie Ferrari, NP tomorrow, 03-21-24, when she is back in office.

## 2024-03-22 ENCOUNTER — Ambulatory Visit: Admitting: Internal Medicine

## 2024-03-22 ENCOUNTER — Ambulatory Visit: Payer: Self-pay

## 2024-03-22 ENCOUNTER — Other Ambulatory Visit: Payer: Self-pay | Admitting: Podiatry

## 2024-03-22 ENCOUNTER — Encounter: Payer: Self-pay | Admitting: Internal Medicine

## 2024-03-22 ENCOUNTER — Other Ambulatory Visit: Payer: Self-pay | Admitting: Internal Medicine

## 2024-03-22 VITALS — BP 118/68 | HR 89 | Ht 66.0 in | Wt 322.0 lb

## 2024-03-22 DIAGNOSIS — J209 Acute bronchitis, unspecified: Secondary | ICD-10-CM | POA: Diagnosis not present

## 2024-03-22 DIAGNOSIS — J440 Chronic obstructive pulmonary disease with acute lower respiratory infection: Secondary | ICD-10-CM | POA: Diagnosis not present

## 2024-03-22 DIAGNOSIS — E1169 Type 2 diabetes mellitus with other specified complication: Secondary | ICD-10-CM | POA: Diagnosis not present

## 2024-03-22 DIAGNOSIS — Z794 Long term (current) use of insulin: Secondary | ICD-10-CM

## 2024-03-22 MED ORDER — AZITHROMYCIN 250 MG PO TABS: ORAL_TABLET | ORAL | 0 refills | Status: AC

## 2024-03-22 MED ORDER — PROMETHAZINE-DM 6.25-15 MG/5ML PO SYRP: 5.0000 mL | ORAL_SOLUTION | Freq: Four times a day (QID) | ORAL | 0 refills | Status: DC | PRN

## 2024-03-22 MED ORDER — METHYLPREDNISOLONE ACETATE 80 MG/ML IJ SUSP
80.0000 mg | Freq: Once | INTRAMUSCULAR | Status: AC
  Administered 2024-03-22: 40 mg via INTRAMUSCULAR

## 2024-03-22 MED ORDER — METHYLPREDNISOLONE 4 MG PO TBPK: ORAL_TABLET | ORAL | 0 refills | Status: DC

## 2024-03-22 NOTE — Assessment & Plan Note (Addendum)
 Recent worsening of cough, dyspnea and wheezing likely due to acute bronchitis/COPD exacerbation Depo-Medrol  40 mg IM today (cautious with dosing due to type II DM) Started azithromycin  for bacterial coverage Started Medrol  Dosepak Albuterol  neb as needed for dyspnea Has Stiolto and Combivent  as maintenance inhaler Continue efforts to cut down -> quit smoking Followed by pulmonology - last visit note reviewed

## 2024-03-22 NOTE — Patient Instructions (Addendum)
 Please start taking Azithromycin  and Prednisone  as prescribed.  Please take Promethazine -DM syrup as needed for cough.  Please use Albuterol  neb as needed for shortness of breath.

## 2024-03-22 NOTE — Telephone Encounter (Signed)
 FYI Only or Action Required?: FYI only for provider.  Patient was last seen in primary care on 02/22/2024 by Tobie Suzzane POUR, MD.  Called Nurse Triage reporting Cough.  Symptoms began a week ago.  Interventions attempted: Nothing.  Symptoms are: gradually worsening.  Triage Disposition: See HCP Within 4 Hours (Or PCP Triage)  Patient/caregiver understands and will follow disposition?: Yes       Copied from CRM [id number]. Topic: Clinical - Red Word Triage >> Mar 22, 2024  7:57 AM [name] wrote: [name] that prompted transfer to Nurse Triage: The patient has called to share that they are experiencing shortness of breath and difficulty breathing Reason for Disposition  [1] MILD difficulty breathing (e.g., minimal/no SOB at rest, SOB with walking, pulse < 100) AND [2] still present when not coughing  Answer Assessment - Initial Assessment Questions 1. ONSET: When did the cough begin?      Week ago Tuesday 2. SEVERITY: How bad is the cough today?      severe 3. SPUTUM: Describe the color of your sputum (e.g., none, dry cough; clear, white, yellow, green)     Yellow/green 4. HEMOPTYSIS: Are you coughing up any blood? If Yes, ask: How much? (e.g., flecks, streaks, tablespoons, etc.)     denies 5. DIFFICULTY BREATHING: Are you having difficulty breathing? If Yes, ask: How bad is it? (e.g., mild, moderate, severe)      Wheezing and was seen and started nebulizer treatments 6. FEVER: Do you have a fever? If Yes, ask: What is your temperature, how was it measured, and when did it start?     denies 7. CARDIAC HISTORY: Do you have any history of heart disease? (e.g., heart attack, congestive heart failure)      no 8. LUNG HISTORY: Do you have any history of lung disease?  (e.g., pulmonary embolus, asthma, emphysema)     copd 9. PE RISK FACTORS: Do you have a history of blood clots? (or: recent major surgery, recent prolonged travel, bedridden)     denies 10.  OTHER SYMPTOMS: Do you have any other symptoms? (e.g., runny nose, wheezing, chest pain)       Runny nose 11. PREGNANCY: Is there any chance you are pregnant? When was your last menstrual period?       na 12. TRAVEL: Have you traveled out of the country in the last month? (e.g., travel history, exposures)       no  Protocols used: Cough - Acute Productive-A-AH

## 2024-03-22 NOTE — Assessment & Plan Note (Signed)
 Lab Results  Component Value Date   HGBA1C 6.3 (H) 02/22/2024    Well-controlled currently, significantly improved compared to prior Associated with HTN, HLD and neuropathy CGM data reviewed - 95% in-range with no severe hyperglycemia, has not required ISS since starting Ozempic  On Lantus  40 units nightly with ISS (150-200: 4 U, 201-250: 6 U, 251-300: 8 U, 301-350: 10 U, 351-400: 12 U, 400 and above: 14 U and call) On Ozempic  1 mg QW and Jardiance 25 mg once daily Metformin was not considered due to her declining kidney function and other psychiatric medication interactions in the past  Checks blood glucose 3 times daily before meals and at bedtime, continues to benefit from CGM for better glycemic profile and regular insulin  dosages - has Dexcom G7 Advised to follow diabetic diet On statin F/u CMP and lipid panel Diabetic eye exam: Advised to follow up with Ophthalmology for diabetic eye exam  On gabapentin  600 mg 3 times daily for DM neuropathy and chronic neck, back pain

## 2024-03-22 NOTE — Addendum Note (Signed)
 Addended by: Catilyn Boggus on: 03/22/2024 10:42 AM   Modules accepted: Orders

## 2024-03-22 NOTE — Progress Notes (Signed)
 Acute Office Visit  Subjective:    Patient ID: [name], female    DOB: 1975/08/13, 48 y.o.   MRN: [id number]  Chief Complaint  Patient presents with   Cough    Reports sx of cough and sob, ongoing for over 1 week, has been having green mucus.     HPI Patient is in today for complaint of cough with yellowish expectoration and recent worsening of dyspnea for the last 1 week.  She has also noted wheezing.  Denies any fever or chills.  She has started using albuterol  neb every 6 hours since the last visit with pulmonologist in the last week.  She has tried taking Promethazine  DM syrup with mild relief.  Past Medical History:  Diagnosis Date   ADHD    Agoraphobia    Allergy 1980's   [place]   Anemia 2023   Anxiety 1988   Arthritis    Asthma 2001   Bipolar disorder (HCC)    Concussion    COPD (chronic obstructive pulmonary disease) (HCC) 2023   DDD (degenerative disc disease), lumbar    Depression 1988   After fathers death   Diabetes mellitus without complication (HCC) 2023   Fibromyalgia    GERD (gastroesophageal reflux disease)    History of pneumonia 2015   History of substance abuse (HCC)    Hypertension 2013   Migraine    Mixed hyperlipidemia    Morbid obesity (HCC)    Neuropathy    OSA (obstructive sleep apnea)    Plantar fascial fibromatosis    Sleep apnea 2021   Spondylosis    Vitamin D  deficiency 03/27/2016    Past Surgical History:  Procedure Laterality Date   CYST REMOVAL HAND Left    ESOPHAGEAL DILATION     ESOPHAGOGASTRODUODENOSCOPY  2006   Dr. Rehman:small hh, esophageal dilation.   ESOPHAGOGASTRODUODENOSCOPY (EGD) WITH PROPOFOL  N/A 05/15/2019   Dr. Sander: Esophagitis with circumferential erosions or excoriations involving 10 cm segment of mid esophagus with overlying exudate.  Most proximal and distal esophagus appeared normal.  Biopsies revealed inflammation only.  Esophagus dilated.   FOOT SURGERY Right    'knot removed   MALONEY  DILATION N/A 05/15/2019   Procedure: MALONEY DILATION;  Surgeon: Shaaron Lamar HERO, MD;  Location: [facility];  Service: Endoscopy;  Laterality: N/A;   MULTIPLE EXTRACTIONS WITH ALVEOLOPLASTY N/A 05/29/2016   Procedure: MULTIPLE EXTRACTIONS;  Surgeon: Glendia Primrose, DDS;  Location: [facility] OR;  Service: Oral Surgery;  Laterality: N/A;   WISDOM TOOTH EXTRACTION      Family History  Problem Relation Age of Onset   Cancer Maternal Grandmother        lung, back   Arthritis Maternal Grandmother    Hypertension Maternal Grandmother    Anemia Maternal Grandmother    Arthritis Maternal Grandfather    Asthma Maternal Grandfather    Hypertension Maternal Grandfather    Diabetes Paternal Grandmother    Heart disease Paternal Grandmother    Arthritis Paternal Grandmother    Cancer Paternal Grandmother    COPD Paternal Grandmother    Depression Paternal Grandmother    Hypertension Paternal Grandmother    Varicose Veins Paternal Grandmother    Diabetes Paternal Grandfather    Heart disease Paternal Grandfather    Stroke Paternal Grandfather    Hypertension Paternal Grandfather    Kidney disease Paternal Grandfather    Obesity Paternal Grandfather    ADD / ADHD Brother    Hypertension Brother  ADD / ADHD Daughter    Anxiety disorder Daughter    Early death Paternal Aunt    Heart disease Paternal Aunt    Hypertension Paternal Aunt     Social History   Socioeconomic History   Marital status: Single    Spouse name: Not on file   Number of children: Not on file   Years of education: Not on file   Highest education level: Associate degree: academic program  Occupational History   Not on file  Tobacco Use   Smoking status: Every Day    Current packs/day: 1.00    Average packs/day: 1 pack/day for 10.0 years (10.0 ttl pk-yrs)    Types: Cigarettes   Smokeless tobacco: Never   Tobacco comments:    Pt smokes 1.5 ppd. AB, CMA 11-29-23  Vaping Use   Vaping status: Never Used  Substance  and Sexual Activity   Alcohol use: Not Currently   Drug use: Not Currently    Types: Cocaine   Sexual activity: Not Currently    Partners: Male    Birth control/protection: None, Abstinence  Other Topics Concern   Not on file  Social History Narrative   Not on file   Social Drivers of Health   Financial Resource Strain: Low Risk  (02/21/2024)   Overall Financial Resource Strain (CARDIA)    Difficulty of Paying Living Expenses: Not hard at all  Food Insecurity: No Food Insecurity (02/21/2024)   Hunger Vital Sign    Worried About Running Out of Food in the Last Year: Never true    Ran Out of Food in the Last Year: Never true  Transportation Needs: No Transportation Needs (02/21/2024)   PRAPARE - Administrator, Civil Service (Medical): No    Lack of Transportation (Non-Medical): No  Physical Activity: Inactive (02/21/2024)   Exercise Vital Sign    Days of Exercise per Week: 0 days    Minutes of Exercise per Session: Not on file  Stress: No Stress Concern Present (02/21/2024)   [employer] of Occupational Health - Occupational Stress Questionnaire    Feeling of Stress: Not at all  Social Connections: Socially Isolated (02/21/2024)   Social Connection and Isolation Panel    Frequency of Communication with Friends and Family: Once a week    Frequency of Social Gatherings with Friends and Family: Once a week    Attends Religious Services: Never    Database administrator or Organizations: No    Attends Engineer, structural: Not on file    Marital Status: Never married  Intimate Partner Violence: Not At Risk (11/13/2021)   Humiliation, Afraid, Rape, and Kick questionnaire    Fear of Current or Ex-Partner: No    Emotionally Abused: No    Physically Abused: No    Sexually Abused: No    Outpatient Medications Prior to Visit  Medication Sig Dispense Refill   albuterol  (PROVENTIL ) (2.5 MG/3ML) 0.083% nebulizer solution Take 3 mLs (2.5 mg total) by nebulization  every 6 (six) hours as needed for wheezing or shortness of breath. 75 mL 5   atorvastatin  (LIPITOR) 20 MG tablet TAKE 1 TABLET BY MOUTH EVERY MORNING FOR high cholesterol 90 tablet 3   busPIRone (BUSPAR) 15 MG tablet Take 15 mg by mouth 3 (three) times daily.     clindamycin (CLEOCIN) 300 MG capsule Take 300 mg by mouth 3 (three) times daily.     COMBIVENT  RESPIMAT 20-100 MCG/ACT AERS respimat Inhale 1 puff into the lungs  every 6 (six) hours as needed for wheezing. 4 g 5   Continuous Glucose Receiver (DEXCOM G7 RECEIVER) DEVI Use it to check blood glucose 3 times before meals, at bedtime and as needed. 1 each 0   Continuous Glucose Sensor (DEXCOM G7 SENSOR) MISC USE AS DIRECTED TO MONITOR BLOOD SUGAR LEVELS DAILY. CHANGE SENSOR EVERY 10 DAYS. check levels BEFORE MEALS AND AT BEDTIME AS NEEDED 3 each 5   Cyanocobalamin  (VITAMIN B-12) 2000 MCG TBCR Take 1 tablet by mouth daily.     fluticasone  (FLONASE ) 50 MCG/ACT nasal spray Place 2 sprays into both nostrils daily. 16 g 6   gabapentin  (NEURONTIN ) 600 MG tablet Take 1 tablet (600 mg total) by mouth 3 (three) times daily. 270 tablet 3   hydrochlorothiazide  (HYDRODIURIL ) 25 MG tablet Take 25 mg by mouth daily as needed (Leg swelling).     HYDROcodone -acetaminophen  (NORCO) 10-325 MG tablet TAKE 1 TABLET BY MOUTH DAILY AS NEEDED 30 tablet 0   hydrOXYzine (ATARAX) 25 MG tablet Take 25 mg by mouth 2 (two) times daily.     insulin  aspart (NOVOLOG  FLEXPEN) 100 UNIT/ML FlexPen Use sliding scale until you follow up with your PCP and establish a regimen for meals. 15 mL 11   Insulin  Pen Needle 32G X 4 MM MISC 1 Needle by Does not apply route as needed. 30 each 1   JARDIANCE 25 MG TABS tablet TAKE 1 TABLET BY MOUTH DAILY IN THE MORNING 90 tablet 3   lamoTRIgine  (LAMICTAL ) 100 MG tablet Take 100 mg by mouth 2 (two) times daily.     LANTUS  SOLOSTAR 100 UNIT/ML Solostar Pen INJECT 50 UNITS UNDER THE SKIN AT BEDTIME 75 mL 2   medroxyPROGESTERone  (PROVERA ) 10 MG  tablet TAKE 1 TABLET BY MOUTH DAILY FOR THE first 10 DAYS of each calender MONTH 10 tablet 11   meloxicam  (MOBIC ) 15 MG tablet TAKE 1 TABLET BY MOUTH ONCE DAILY 60 tablet 1   methocarbamol  (ROBAXIN ) 750 MG tablet TAKE 1 TABLET BY MOUTH UP TO FOUR TIMES DAILY FOR THE PAIN OF FIBROMYALGIA 120 tablet 3   nicotine  (NICODERM CQ  - DOSED IN MG/24 HOURS) 21 mg/24hr patch RX #1 Weeks 1-4: 21 mg x 1 patch daily. Wear for 24 hours. If you have sleep disturbances, remove at bedtime. 28 patch 0   nicotine  polacrilex (COMMIT) 4 MG lozenge RX #1 Weeks 1-6: 1 lozenge every 1-2 hours. Use at least 9 lozenges per day for the first 6 weeks. Max 20 lozenges per day. 1008 lozenge 0   omeprazole (PRILOSEC) 40 MG capsule TAKE ONE CAPSULE BY MOUTH TWICE DAILY FOR acid reflux 180 capsule 3   ondansetron  (ZOFRAN ) 4 MG tablet Take 4 mg by mouth 3 (three) times daily as needed for nausea or vomiting.     OZEMPIC , 1 MG/DOSE, 4 MG/3ML SOPN INJECT 1 MG UNDER THE SKIN ONCE A WEEK 3 mL 3   prazosin (MINIPRESS) 2 MG capsule Take 2 mg by mouth at bedtime.     Prenatal Vit-Fe Fumarate-FA (PRENATAL COMPLETE PO) Take 1 tablet by mouth daily.      QUEtiapine  (SEROQUEL ) 300 MG tablet Take 300 mg by mouth at bedtime.     silver  sulfADIAZINE  (SILVADENE ) 1 % cream Use to areas 2-3 times daily as needed 50 g 11   Tiotropium Bromide-Olodaterol (STIOLTO RESPIMAT ) 2.5-2.5 MCG/ACT AERS Inhale 2 puffs into the lungs daily. 1 each 6   topiramate  (TOPAMAX ) 100 MG tablet Take 100 mg by mouth daily.  Vitamin D , Ergocalciferol , (DRISDOL ) 1.25 MG (50000 UNIT) CAPS capsule Take 1 capsule (50,000 Units total) by mouth every 7 (seven) days. 12 capsule 1   VRAYLAR 1.5 MG capsule TAKE ONE CAPSULE BY MOUTH DAILY 90 capsule 3   No facility-administered medications prior to visit.    Allergies  Allergen Reactions   Penicillins Rash     Has patient had a PCN reaction causing immediate rash, facial/tongue/throat swelling, SOB or lightheadedness with  hypotension:  # # YES # #  Has patient had a PCN reaction causing severe rash involving mucus membranes or skin necrosis:  # # YES # #  Has patient had a PCN reaction that required hospitalization   .No Has patient had a PCN reaction occurring within the last 10 years: No If all of the above answers are NO, then may proceed with Cephalosporin use.    Sulfa Antibiotics Anaphylaxis, Hives and Swelling    Swelling in hands/legs DIFFICULTY SWALLOWING   Latex Other (See Comments)    Red at site of latex bandage-Dermabond   Nisoldipine    Other Other (See Comments)    Pt says pain meds make her nauseous     Review of Systems  Constitutional:  Positive for fatigue. Negative for chills and fever.  HENT:  Positive for congestion. Negative for sore throat.   Eyes:  Negative for pain and discharge.  Respiratory:  Positive for cough, shortness of breath (Intermittent) and wheezing.   Cardiovascular:  Negative for chest pain and palpitations.  Gastrointestinal:  Negative for abdominal pain, diarrhea, nausea and vomiting.  Endocrine: Negative for polydipsia and polyuria.  Genitourinary:  Negative for dysuria and hematuria.  Musculoskeletal:  Positive for arthralgias, back pain and neck pain. Negative for neck stiffness.  Skin:  Negative for rash.  Neurological:  Negative for dizziness and weakness.  Psychiatric/Behavioral:  Positive for sleep disturbance. Negative for agitation and behavioral problems. The patient is nervous/anxious.        Objective:    Physical Exam Vitals reviewed.  Constitutional:      General: She is not in acute distress.    Appearance: She is obese. She is not diaphoretic.     Comments: Has a rolling walker  HENT:     Head: Normocephalic and atraumatic.     Nose: Nose normal.     Mouth/Throat:     Mouth: Mucous membranes are moist.  Eyes:     General: No scleral icterus.    Extraocular Movements: Extraocular movements intact.  Cardiovascular:     Rate and  Rhythm: Normal rate and regular rhythm.     Heart sounds: Normal heart sounds. No murmur heard. Pulmonary:     Breath sounds: Wheezing (Diffuse, bilateral) present. No rales.  Musculoskeletal:     Cervical back: Neck supple. No tenderness.     Right lower leg: No edema.     Left lower leg: No edema.  Feet:     Comments: Tenderness over sole area near 3rd and 4th toe Skin:    General: Skin is warm.     Findings: No rash.  Neurological:     General: No focal deficit present.     Mental Status: She is alert and oriented to person, place, and time.     Sensory: Sensory deficit (B/l LE) present.     Motor: Weakness (B/l LE - 4/5) present.  Psychiatric:        Mood and Affect: Mood normal.        Behavior:  Behavior normal.     BP 118/68   Pulse 89   Ht 5' 6 (1.676 m)   Wt (!) 322 lb (146.1 kg)   SpO2 94%   BMI 51.97 kg/m  Wt Readings from Last 3 Encounters:  03/22/24 (!) 322 lb (146.1 kg)  03/14/24 (!) 322 lb 9.6 oz (146.3 kg)  01/05/24 (!) 327 lb 6.4 oz (148.5 kg)        Assessment & Plan:   Problem List Items Addressed This Visit       Respiratory   Acute bronchitis with COPD (HCC) - Primary   Recent worsening of cough, dyspnea and wheezing likely due to acute bronchitis/COPD exacerbation Depo-Medrol  40 mg IM today (cautious with dosing due to type II DM) Started azithromycin  for bacterial coverage Started Medrol  Dosepak Albuterol  neb as needed for dyspnea Has Stiolto and Combivent  as maintenance inhaler Continue efforts to cut down -> quit smoking Followed by pulmonology - last visit note reviewed      Relevant Medications   azithromycin  (ZITHROMAX ) 250 MG tablet   methylPREDNISolone  (MEDROL  DOSEPAK) 4 MG TBPK tablet   promethazine -dextromethorphan (PROMETHAZINE -DM) 6.25-15 MG/5ML syrup     Endocrine   Type 2 diabetes mellitus with other specified complication (HCC)   Lab Results  Component Value Date   HGBA1C 6.3 (H) 02/22/2024    Well-controlled  currently, significantly improved compared to prior Associated with HTN, HLD and neuropathy CGM data reviewed - 95% in-range with no severe hyperglycemia, has not required ISS since starting Ozempic  On Lantus  40 units nightly with ISS (150-200: 4 U, 201-250: 6 U, 251-300: 8 U, 301-350: 10 U, 351-400: 12 U, 400 and above: 14 U and call) On Ozempic  1 mg QW and Jardiance 25 mg once daily Metformin was not considered due to her declining kidney function and other psychiatric medication interactions in the past  Checks blood glucose 3 times daily before meals and at bedtime, continues to benefit from CGM for better glycemic profile and regular insulin  dosages - has Dexcom G7 Advised to follow diabetic diet On statin F/u CMP and lipid panel Diabetic eye exam: Advised to follow up with Ophthalmology for diabetic eye exam  On gabapentin  600 mg 3 times daily for DM neuropathy and chronic neck, back pain        Meds ordered this encounter  Medications   azithromycin  (ZITHROMAX ) 250 MG tablet    Sig: Take 2 tablets on day 1, then 1 tablet daily on days 2 through 5    Dispense:  6 tablet    Refill:  0   methylPREDNISolone  (MEDROL  DOSEPAK) 4 MG TBPK tablet    Sig: Take as package instructions.    Dispense:  1 each    Refill:  0   promethazine -dextromethorphan (PROMETHAZINE -DM) 6.25-15 MG/5ML syrup    Sig: Take 5 mLs by mouth 4 (four) times daily as needed.    Dispense:  118 mL    Refill:  0     Gianlucas Evenson MARLA Blanch, MD

## 2024-03-27 DIAGNOSIS — G4733 Obstructive sleep apnea (adult) (pediatric): Secondary | ICD-10-CM | POA: Diagnosis not present

## 2024-04-02 ENCOUNTER — Other Ambulatory Visit: Payer: Self-pay | Admitting: Internal Medicine

## 2024-04-03 ENCOUNTER — Other Ambulatory Visit (HOSPITAL_COMMUNITY): Payer: Self-pay

## 2024-04-03 ENCOUNTER — Telehealth: Payer: Self-pay | Admitting: Pharmacy Technician

## 2024-04-03 NOTE — Telephone Encounter (Signed)
 Pharmacy Patient Advocate Encounter   Received notification from CoverMyMeds that prior authorization for Dexcom G7 sensors is required/requested.   Insurance verification completed.   The patient is insured through [employer] .   Per test claim: PA required; PA submitted to above mentioned insurance via Latent Key/confirmation #/EOC [id number] Status is pending

## 2024-04-03 NOTE — Telephone Encounter (Signed)
 Pharmacy Patient Advocate Encounter  Received notification from [facility] MEDICAID that Prior Authorization for Dexcom G7 Sensor  has been APPROVED from 04/03/2024 to 04/03/2025. Ran test claim, Copay is $0.00. This test claim was processed through [facility]- copay amounts may vary at other pharmacies due to pharmacy/plan contracts, or as the patient moves through the different stages of their insurance plan.   PA #/Case ID/Reference #: [id number]

## 2024-04-11 DIAGNOSIS — I10 Essential (primary) hypertension: Secondary | ICD-10-CM | POA: Diagnosis not present

## 2024-04-16 DIAGNOSIS — J449 Chronic obstructive pulmonary disease, unspecified: Secondary | ICD-10-CM | POA: Diagnosis not present

## 2024-04-18 DIAGNOSIS — F3181 Bipolar II disorder: Secondary | ICD-10-CM | POA: Diagnosis not present

## 2024-04-18 DIAGNOSIS — F411 Generalized anxiety disorder: Secondary | ICD-10-CM | POA: Diagnosis not present

## 2024-04-18 DIAGNOSIS — F4310 Post-traumatic stress disorder, unspecified: Secondary | ICD-10-CM | POA: Diagnosis not present

## 2024-04-22 ENCOUNTER — Other Ambulatory Visit: Payer: Self-pay | Admitting: Internal Medicine

## 2024-04-24 ENCOUNTER — Encounter: Payer: Self-pay | Admitting: Gastroenterology

## 2024-04-24 ENCOUNTER — Ambulatory Visit (INDEPENDENT_AMBULATORY_CARE_PROVIDER_SITE_OTHER): Admitting: Gastroenterology

## 2024-04-24 VITALS — BP 95/68 | HR 98 | Temp 97.3°F | Ht 66.0 in | Wt 315.6 lb

## 2024-04-24 DIAGNOSIS — Z1211 Encounter for screening for malignant neoplasm of colon: Secondary | ICD-10-CM | POA: Insufficient documentation

## 2024-04-24 DIAGNOSIS — K5900 Constipation, unspecified: Secondary | ICD-10-CM | POA: Diagnosis not present

## 2024-04-24 DIAGNOSIS — R748 Abnormal levels of other serum enzymes: Secondary | ICD-10-CM | POA: Diagnosis not present

## 2024-04-24 MED ORDER — LUBIPROSTONE 24 MCG PO CAPS: 24.0000 ug | ORAL_CAPSULE | Freq: Two times a day (BID) | ORAL | 3 refills | Status: AC

## 2024-04-24 NOTE — Patient Instructions (Signed)
 Start lubiprostone  24mcg twice daily with food for constipation. If you are not moving your bowels 3-4 times per week, you need to let me know. This is very important so that we can make sure you are able to prep adequately for upcoming colonoscopy. Colonoscopy to be scheduled in near future. Return office in 3-4 months to discuss elevated liver labs.

## 2024-04-24 NOTE — Progress Notes (Signed)
 GI Office Note    Referring Provider: Tobie Suzzane POUR, MD Primary Care Physician:  Tobie Suzzane POUR, MD  Primary Gastroenterologist: Ozell Hollingshead, MD   Chief Complaint   Chief Complaint  Patient presents with   Colonoscopy    Has issues with constipation.    History of Present Illness   [name] is a 48 y.o. female presenting today at the request of Dr. Tobie for screening colonoscopy. She has severe constipation prompting office visit.   She has struggled with constipation for years but it continues to get worse over time which she contributes to all of her medications. At times she has to digitally disimpact herself. She can go a week without a stool. She then takes enemas, mag citrate. She has tried miralax and stool softeners in the past. Previously did well on amitiza . No melena or rectal bleeding.  She has significant abdominal cramping just before she has bowel movement.  Heartburn well-controlled on omeprazole 40 mg daily for breakfast.  No dysphagia or vomiting.  In March 2024 she presented with glucose of 611.  Her AST was 165, ALT 211, alk phos 234, total bilirubin 7.2.  Nine days later her LFTs were normal except for alk phos of 160. She has had normal LFTs in 2018.   HCV Ab negative in 09/2023.   Prior Data    EGD 05/2019: -esophagitis, minimal inflamed squamous mucosa -small hiatal hernia   Medications   Current Outpatient Medications  Medication Sig Dispense Refill   albuterol  (PROVENTIL ) (2.5 MG/3ML) 0.083% nebulizer solution Take 3 mLs (2.5 mg total) by nebulization every 6 (six) hours as needed for wheezing or shortness of breath. 75 mL 5   atorvastatin  (LIPITOR) 20 MG tablet TAKE 1 TABLET BY MOUTH EVERY MORNING FOR high cholesterol 90 tablet 3   busPIRone (BUSPAR) 15 MG tablet Take 15 mg by mouth 3 (three) times daily.     COMBIVENT  RESPIMAT 20-100 MCG/ACT AERS respimat Inhale 1 puff into the lungs every 6 (six) hours as needed for wheezing. 4 g  5   Continuous Glucose Receiver (DEXCOM G7 RECEIVER) DEVI Use it to check blood glucose 3 times before meals, at bedtime and as needed. 1 each 0   Continuous Glucose Sensor (DEXCOM G7 SENSOR) MISC USE AS DIRECTED TO MONITOR BLOOD SUGAR LEVELS DAILY. CHANGE SENSOR EVERY 10 DAYS. check levels BEFORE MEALS AND AT BEDTIME AS NEEDED 3 each 5   Cyanocobalamin  (VITAMIN B-12) 2000 MCG TBCR Take 1 tablet by mouth daily.     fluticasone  (FLONASE ) 50 MCG/ACT nasal spray Place 2 sprays into both nostrils daily. 16 g 6   gabapentin  (NEURONTIN ) 600 MG tablet Take 1 tablet (600 mg total) by mouth 3 (three) times daily. 270 tablet 3   hydrochlorothiazide  (HYDRODIURIL ) 25 MG tablet Take 25 mg by mouth daily as needed (Leg swelling).     HYDROcodone -acetaminophen  (NORCO) 10-325 MG tablet TAKE 1 TABLET BY MOUTH DAILY AS NEEDED 30 tablet 0   hydrOXYzine (ATARAX) 25 MG tablet Take 25 mg by mouth 2 (two) times daily.     insulin  aspart (NOVOLOG  FLEXPEN) 100 UNIT/ML FlexPen Use sliding scale until you follow up with your PCP and establish a regimen for meals. 15 mL 11   Insulin  Pen Needle 32G X 4 MM MISC 1 Needle by Does not apply route as needed. 30 each 1   JARDIANCE 25 MG TABS tablet TAKE 1 TABLET BY MOUTH DAILY IN THE MORNING 90 tablet 3  lamoTRIgine  (LAMICTAL ) 100 MG tablet Take 100 mg by mouth 2 (two) times daily.     LANTUS  SOLOSTAR 100 UNIT/ML Solostar Pen INJECT 50 UNITS UNDER THE SKIN AT BEDTIME 75 mL 2   medroxyPROGESTERone  (PROVERA ) 10 MG tablet TAKE 1 TABLET BY MOUTH DAILY FOR THE first 10 DAYS of each calender MONTH 10 tablet 11   methocarbamol  (ROBAXIN ) 750 MG tablet TAKE 1 TABLET BY MOUTH UP TO FOUR TIMES DAILY FOR THE PAIN OF FIBROMYALGIA 120 tablet 3   nicotine  (NICODERM CQ  - DOSED IN MG/24 HOURS) 21 mg/24hr patch RX #1 Weeks 1-4: 21 mg x 1 patch daily. Wear for 24 hours. If you have sleep disturbances, remove at bedtime. 28 patch 0   nicotine  polacrilex (COMMIT) 4 MG lozenge RX #1 Weeks 1-6: 1 lozenge  every 1-2 hours. Use at least 9 lozenges per day for the first 6 weeks. Max 20 lozenges per day. 1008 lozenge 0   omeprazole (PRILOSEC) 40 MG capsule TAKE ONE CAPSULE BY MOUTH TWICE DAILY FOR acid reflux 180 capsule 3   ondansetron  (ZOFRAN ) 4 MG tablet Take 4 mg by mouth 3 (three) times daily as needed for nausea or vomiting.     OZEMPIC , 1 MG/DOSE, 4 MG/3ML SOPN INJECT 1 MG UNDER THE SKIN ONCE A WEEK 3 mL 3   prazosin (MINIPRESS) 2 MG capsule Take 2 mg by mouth at bedtime.     Prenatal Vit-Fe Fumarate-FA (PRENATAL COMPLETE PO) Take 1 tablet by mouth daily.      QUEtiapine  (SEROQUEL ) 300 MG tablet Take 300 mg by mouth at bedtime.     silver  sulfADIAZINE  (SILVADENE ) 1 % cream Use to areas 2-3 times daily as needed 50 g 11   Tiotropium Bromide-Olodaterol (STIOLTO RESPIMAT ) 2.5-2.5 MCG/ACT AERS Inhale 2 puffs into the lungs daily. 1 each 6   topiramate  (TOPAMAX ) 100 MG tablet Take 100 mg by mouth daily.     Vitamin D , Ergocalciferol , (DRISDOL ) 1.25 MG (50000 UNIT) CAPS capsule Take 1 capsule (50,000 Units total) by mouth every 7 (seven) days. 12 capsule 1   VRAYLAR 1.5 MG capsule TAKE ONE CAPSULE BY MOUTH DAILY 90 capsule 3   No current facility-administered medications for this visit.    Allergies   Allergies as of 04/24/2024 - Review Complete 04/24/2024  Allergen Reaction Noted   Penicillins Rash 12/09/2012   Sulfa antibiotics Anaphylaxis, Hives, and Swelling 12/09/2012   Latex Other (See Comments) 12/09/2012   Nisoldipine  11/30/2017   Other Other (See Comments) 04/29/2016     Past Medical History   Past Medical History:  Diagnosis Date   ADHD    Agoraphobia    Allergy 1980's   [place]   Anemia 2023   Anxiety 1988   Arthritis    Asthma 2001   Bipolar disorder (HCC)    Concussion    COPD (chronic obstructive pulmonary disease) (HCC) 2023   DDD (degenerative disc disease), lumbar    Depression 1988   After fathers death   Diabetes mellitus without complication (HCC)  2023   Fibromyalgia    GERD (gastroesophageal reflux disease)    History of pneumonia 2015   History of substance abuse (HCC)    Hypertension 2013   Migraine    Mixed hyperlipidemia    Morbid obesity (HCC)    Neuropathy    OSA (obstructive sleep apnea)    Plantar fascial fibromatosis    Sleep apnea 2021   Spondylosis    Vitamin D  deficiency 03/27/2016  Past Surgical History   Past Surgical History:  Procedure Laterality Date   CYST REMOVAL HAND Left    ESOPHAGEAL DILATION     ESOPHAGOGASTRODUODENOSCOPY  2006   Dr. Rehman:small hh, esophageal dilation.   ESOPHAGOGASTRODUODENOSCOPY (EGD) WITH PROPOFOL  N/A 05/15/2019   Dr. Sander: Esophagitis with circumferential erosions or excoriations involving 10 cm segment of mid esophagus with overlying exudate.  Most proximal and distal esophagus appeared normal.  Biopsies revealed inflammation only.  Esophagus dilated.   FOOT SURGERY Right    'knot removed   MALONEY DILATION N/A 05/15/2019   Procedure: MALONEY DILATION;  Surgeon: Shaaron Lamar HERO, MD;  Location: [facility];  Service: Endoscopy;  Laterality: N/A;   MULTIPLE EXTRACTIONS WITH ALVEOLOPLASTY N/A 05/29/2016   Procedure: MULTIPLE EXTRACTIONS;  Surgeon: Glendia Primrose, DDS;  Location: [facility] OR;  Service: Oral Surgery;  Laterality: N/A;   WISDOM TOOTH EXTRACTION      Past Family History   Family History  Problem Relation Age of Onset   ADD / ADHD Brother    Hypertension Brother    Arthritis Maternal Grandmother    Hypertension Maternal Grandmother    Anemia Maternal Grandmother    Arthritis Maternal Grandfather    Asthma Maternal Grandfather    Hypertension Maternal Grandfather    Colon polyps Maternal Grandfather    Diabetes Paternal Grandmother    Heart disease Paternal Grandmother    Arthritis Paternal Grandmother    Breast cancer Paternal Grandmother    COPD Paternal Grandmother    Depression Paternal Grandmother    Hypertension Paternal Grandmother    Varicose  Veins Paternal Grandmother    Lung cancer Paternal Grandmother    Diabetes Paternal Grandfather    Heart disease Paternal Grandfather    Stroke Paternal Grandfather    Hypertension Paternal Grandfather    Kidney disease Paternal Grandfather    Obesity Paternal Actor    ADD / ADHD Daughter    Anxiety disorder Daughter    Early death Paternal Aunt    Heart disease Paternal Aunt    Hypertension Paternal Aunt     Past Social History   Social History   Socioeconomic History   Marital status: Single    Spouse name: Not on file   Number of children: Not on file   Years of education: Not on file   Highest education level: Associate degree: academic program  Occupational History   Not on file  Tobacco Use   Smoking status: Every Day    Current packs/day: 1.00    Average packs/day: 1 pack/day for 10.0 years (10.0 ttl pk-yrs)    Types: Cigarettes   Smokeless tobacco: Never   Tobacco comments:    Pt smokes 1.5 ppd. AB, CMA 11-29-23  Vaping Use   Vaping status: Never Used  Substance and Sexual Activity   Alcohol use: Not Currently    Comment: heavy etoh 15 years ago (04/2024)   Drug use: Not Currently    Types: Cocaine    Comment: tried cocaine (none for 15 years (04/2024)   Sexual activity: Not Currently    Partners: Male    Birth control/protection: None, Abstinence  Other Topics Concern   Not on file  Social History Narrative   Not on file   Social Drivers of Health   Financial Resource Strain: Low Risk  (02/21/2024)   Overall Financial Resource Strain (CARDIA)    Difficulty of Paying Living Expenses: Not hard at all  Food Insecurity: No Food Insecurity (02/21/2024)   Hunger  Vital Sign    Worried About Programme researcher, broadcasting/film/video in the Last Year: Never true    Ran Out of Food in the Last Year: Never true  Transportation Needs: No Transportation Needs (02/21/2024)   PRAPARE - Administrator, Civil Service (Medical): No    Lack of Transportation  (Non-Medical): No  Physical Activity: Inactive (02/21/2024)   Exercise Vital Sign    Days of Exercise per Week: 0 days    Minutes of Exercise per Session: Not on file  Stress: No Stress Concern Present (02/21/2024)   [employer] of Occupational Health - Occupational Stress Questionnaire    Feeling of Stress: Not at all  Social Connections: Socially Isolated (02/21/2024)   Social Connection and Isolation Panel    Frequency of Communication with Friends and Family: Once a week    Frequency of Social Gatherings with Friends and Family: Once a week    Attends Religious Services: Never    Database administrator or Organizations: No    Attends Engineer, structural: Not on file    Marital Status: Never married  Intimate Partner Violence: Not At Risk (11/13/2021)   Humiliation, Afraid, Rape, and Kick questionnaire    Fear of Current or Ex-Partner: No    Emotionally Abused: No    Physically Abused: No    Sexually Abused: No    Review of Systems   General: Negative for anorexia, weight loss, fever, chills, fatigue, weakness. ENT: Negative for hoarseness, difficulty swallowing , nasal congestion. CV: Negative for chest pain, angina, palpitations, dyspnea on exertion, peripheral edema.  Respiratory: Negative for dyspnea at rest, dyspnea on exertion, cough, sputum, wheezing.  MSK: chronic pain GI: See history of present illness. GU:  Negative for dysuria, hematuria, urinary incontinence, urinary frequency, nocturnal urination.  Endo: Negative for unusual weight change.     Physical Exam   BP 95/68 (BP Location: Right Arm, Patient Position: Sitting, Cuff Size: Large)   Pulse 98   Temp (!) 97.3 F (36.3 C) (Oral)   Ht 5' 6 (1.676 m)   Wt (!) 315 lb 9.6 oz (143.2 kg)   LMP  (LMP Unknown)   SpO2 95%   BMI 50.94 kg/m    General: Well-nourished, well-developed in no acute distress.  Eyes: No icterus. Mouth: Oropharyngeal mucosa moist and pink   Lungs: Clear to auscultation  bilaterally.  Heart: Regular rate and rhythm, no murmurs rubs or gallops.  Abdomen: Bowel sounds are normal, nontender, nondistended, no hepatosplenomegaly or masses,  no abdominal bruits or hernia , no rebound or guarding.  Rectal: not performed Extremities: No lower extremity edema. No clubbing or deformities. Neuro: Alert and oriented x 4   Skin: Warm and dry, no jaundice.   Psych: Alert and cooperative, normal mood and affect.  Labs   Lab Results  Component Value Date   NA 140 02/22/2024   CL 103 02/22/2024   K 4.2 02/22/2024   CO2 20 02/22/2024   BUN 14 02/22/2024   CREATININE 1.22 (H) 02/22/2024   EGFR 55 (L) 02/22/2024   CALCIUM  9.6 02/22/2024   ALBUMIN 4.4 11/22/2023   GLUCOSE 101 (H) 02/22/2024   Lab Results  Component Value Date   WBC 11.5 (H) 02/22/2024   HGB 15.2 02/22/2024   HCT 44.9 02/22/2024   MCV 96 02/22/2024   PLT 202 02/22/2024   Lab Results  Component Value Date   ALT 19 11/22/2023   AST 15 11/22/2023   ALKPHOS 157 (H)  11/22/2023   BILITOT 0.3 11/22/2023   Lab Results  Component Value Date   HGBA1C 6.3 (H) 02/22/2024   Lab Results  Component Value Date   VITAMINB12 1,549 (H) 09/20/2023   Lab Results  Component Value Date   TSH 1.290 09/20/2023    Imaging Studies   No results found.  Assessment/Plan:    Colon cancer screening: -first ever colonoscopy, FH maternal GF with colon polyps -ASA 3 - I have discussed the risks, alternatives, benefits with regards to but not limited to the risk of reaction to medication, bleeding, infection, perforation and the patient is agreeable to proceed. Written consent to be obtained.  Constipation: worsening chronic constipation, previously did well on lubiprostone  -add lubiprostone  24mcg BID -call if not effective  Elevated LFTs: persistent elevation of alkaline phosphatase: -return in 3-4 months for further evaluation, will complete labs at that time. Based on findings, she may require liver  imaging. She is at increased risk of MASLD (Metabolic Dysfunction-Associated Steatotic Liver Disease). With persistent elevated alkaline phosphatase, would want to consider etiologies as PBC.    Sonny RAMAN. Ezzard, MHS, PA-C [facility]

## 2024-04-25 ENCOUNTER — Encounter: Payer: Self-pay | Admitting: *Deleted

## 2024-04-25 ENCOUNTER — Other Ambulatory Visit: Payer: Self-pay | Admitting: *Deleted

## 2024-04-25 DIAGNOSIS — Z1211 Encounter for screening for malignant neoplasm of colon: Secondary | ICD-10-CM

## 2024-04-25 MED ORDER — PEG 3350-KCL-NA BICARB-NACL 420 G PO SOLR: 4000.0000 mL | Freq: Once | ORAL | 0 refills | Status: AC

## 2024-05-01 ENCOUNTER — Other Ambulatory Visit: Payer: Self-pay | Admitting: Internal Medicine

## 2024-05-03 ENCOUNTER — Telehealth: Payer: Self-pay | Admitting: *Deleted

## 2024-05-03 ENCOUNTER — Other Ambulatory Visit: Payer: Self-pay

## 2024-05-03 DIAGNOSIS — F17210 Nicotine dependence, cigarettes, uncomplicated: Secondary | ICD-10-CM

## 2024-05-03 MED ORDER — NICOTINE 21 MG/24HR TD PT24: MEDICATED_PATCH | TRANSDERMAL | 0 refills | Status: DC

## 2024-05-03 MED ORDER — NICOTINE POLACRILEX 4 MG MT LOZG: LOZENGE | OROMUCOSAL | 0 refills | Status: AC

## 2024-05-03 NOTE — Telephone Encounter (Signed)
 Pt informed that procedure has been cancelled.

## 2024-05-03 NOTE — Telephone Encounter (Signed)
 Spoke with pt and she said her insurance wants her to do the cologuard instead of colonoscopy. She says her insurance sent her the cologuard.     Patient left message canceling her procedure on 05/10/24 Received: Today Faint, Judith JONETTA Gaylene Madelin LITTIE, [name] Cc: [name],  The above patient left a message canceling her Colonoscopy with Dr. Shaaron on 05/10/24. She didn't give a reason why. Should I wait for you to call her to see if she wants to reschedule before taking her off the schedule?  Thank you,  Luke FALCON.

## 2024-05-05 ENCOUNTER — Encounter (HOSPITAL_COMMUNITY)

## 2024-05-10 ENCOUNTER — Ambulatory Visit (HOSPITAL_COMMUNITY): Admit: 2024-05-10 | Admitting: Internal Medicine

## 2024-05-10 ENCOUNTER — Encounter (HOSPITAL_COMMUNITY): Payer: Self-pay

## 2024-05-10 SURGERY — COLONOSCOPY
Anesthesia: Choice

## 2024-05-12 DIAGNOSIS — I10 Essential (primary) hypertension: Secondary | ICD-10-CM | POA: Diagnosis not present

## 2024-05-16 ENCOUNTER — Ambulatory Visit (HOSPITAL_COMMUNITY)
Admission: RE | Admit: 2024-05-16 | Discharge: 2024-05-16 | Disposition: A | Discharge status: Home / Self Care | Source: Ambulatory Visit | Attending: Pulmonary Disease | Admitting: Pulmonary Disease

## 2024-05-16 DIAGNOSIS — J449 Chronic obstructive pulmonary disease, unspecified: Secondary | ICD-10-CM | POA: Insufficient documentation

## 2024-05-16 DIAGNOSIS — F17210 Nicotine dependence, cigarettes, uncomplicated: Secondary | ICD-10-CM | POA: Diagnosis not present

## 2024-05-16 LAB — PULMONARY FUNCTION TEST
DL/VA % pred: 66 %
DL/VA: 2.83 ml/min/mmHg/L
DLCO unc % pred: 73 %
DLCO unc: 16.62 ml/min/mmHg
FEF 25-75 Pre: 1.78 L/s
FEF2575-%Pred-Pre: 59 %
FEV1-%Pred-Pre: 89 %
FEV1-Pre: 2.75 L
FEV1FVC-%Pred-Pre: 85 %
FEV6-%Pred-Pre: 102 %
FEV6-Pre: 3.86 L
FEV6FVC-%Pred-Pre: 99 %
FVC-%Pred-Pre: 103 %
FVC-Pre: 3.96 L
Pre FEV1/FVC ratio: 69 %
Pre FEV6/FVC Ratio: 98 %
RV % pred: 138 %
RV: 2.53 L
TLC % pred: 123 %
TLC: 6.6 L

## 2024-05-17 DIAGNOSIS — J449 Chronic obstructive pulmonary disease, unspecified: Secondary | ICD-10-CM | POA: Diagnosis not present

## 2024-05-23 ENCOUNTER — Ambulatory Visit: Payer: Self-pay | Admitting: Pulmonary Disease

## 2024-05-27 DIAGNOSIS — G4733 Obstructive sleep apnea (adult) (pediatric): Secondary | ICD-10-CM | POA: Diagnosis not present

## 2024-05-29 ENCOUNTER — Other Ambulatory Visit: Payer: Self-pay | Admitting: Internal Medicine

## 2024-05-29 DIAGNOSIS — M5010 Cervical disc disorder with radiculopathy, unspecified cervical region: Secondary | ICD-10-CM

## 2024-05-29 DIAGNOSIS — M51362 Other intervertebral disc degeneration, lumbar region with discogenic back pain and lower extremity pain: Secondary | ICD-10-CM

## 2024-05-30 ENCOUNTER — Other Ambulatory Visit: Payer: Self-pay | Admitting: Internal Medicine

## 2024-05-30 ENCOUNTER — Telehealth: Payer: Self-pay | Admitting: Pharmacy Technician

## 2024-05-30 ENCOUNTER — Other Ambulatory Visit (HOSPITAL_COMMUNITY): Payer: Self-pay

## 2024-05-30 DIAGNOSIS — J449 Chronic obstructive pulmonary disease, unspecified: Secondary | ICD-10-CM

## 2024-05-30 NOTE — Telephone Encounter (Signed)
 Pharmacy Patient Advocate Encounter  Received notification from [facility] MEDICAID that Prior Authorization for HYDROcodone -Acetaminophen  10-325MG  tablets has been APPROVED from 05/30/2024 to 11/27/2024. Ran test claim, Copay is $4.00. This test claim was processed through [facility]- copay amounts may vary at other pharmacies due to pharmacy/plan contracts, or as the patient moves through the different stages of their insurance plan.   PA #/Case ID/Reference #: [id number]

## 2024-05-30 NOTE — Telephone Encounter (Signed)
 Pharmacy Patient Advocate Encounter   Received notification from CoverMyMeds that prior authorization for HYDROcodone -Acetaminophen  10-325MG  tablets is required/requested.   Insurance verification completed.   The patient is insured through [employer].   Per test claim: PA required; PA submitted to above mentioned insurance via Latent Key/confirmation #/EOC [facility] Status is pending

## 2024-05-31 DIAGNOSIS — I10 Essential (primary) hypertension: Secondary | ICD-10-CM | POA: Diagnosis not present

## 2024-06-02 ENCOUNTER — Telehealth: Payer: Self-pay

## 2024-06-02 ENCOUNTER — Other Ambulatory Visit (HOSPITAL_COMMUNITY): Payer: Self-pay

## 2024-06-02 NOTE — Telephone Encounter (Signed)
 PA request has been Approved. New Encounter has been or will be created for follow up. For additional info see Pharmacy Prior Auth telephone encounter from 05/30/2024.  It was for Hydrodocodone/Apap 10/325mg  not oxycodone . Spoke with [facility] drug this morning and they do have the prescription reprocessed and they are getting it ready. $4.00 copay.

## 2024-06-02 NOTE — Telephone Encounter (Signed)
 Copied from CRM #[id number]. Topic: Clinical - Medication Prior Auth >> Jun 01, 2024  4:29 PM Jasmin G wrote: Reason for CRM: [facility] - [place], [place] - [address] needs prior authorization to refill oxycodone .

## 2024-06-12 ENCOUNTER — Ambulatory Visit: Payer: Self-pay | Admitting: Internal Medicine

## 2024-06-12 ENCOUNTER — Encounter: Payer: Self-pay | Admitting: Internal Medicine

## 2024-06-12 VITALS — BP 114/79 | HR 95 | Ht 66.0 in | Wt 319.8 lb

## 2024-06-12 DIAGNOSIS — E1169 Type 2 diabetes mellitus with other specified complication: Secondary | ICD-10-CM

## 2024-06-12 DIAGNOSIS — Z23 Encounter for immunization: Secondary | ICD-10-CM

## 2024-06-12 DIAGNOSIS — I10 Essential (primary) hypertension: Secondary | ICD-10-CM | POA: Diagnosis not present

## 2024-06-12 DIAGNOSIS — Z794 Long term (current) use of insulin: Secondary | ICD-10-CM

## 2024-06-12 DIAGNOSIS — F3162 Bipolar disorder, current episode mixed, moderate: Secondary | ICD-10-CM | POA: Diagnosis not present

## 2024-06-12 DIAGNOSIS — R002 Palpitations: Secondary | ICD-10-CM | POA: Diagnosis not present

## 2024-06-12 DIAGNOSIS — L739 Follicular disorder, unspecified: Secondary | ICD-10-CM | POA: Insufficient documentation

## 2024-06-12 DIAGNOSIS — E1142 Type 2 diabetes mellitus with diabetic polyneuropathy: Secondary | ICD-10-CM | POA: Diagnosis not present

## 2024-06-12 DIAGNOSIS — Z1231 Encounter for screening mammogram for malignant neoplasm of breast: Secondary | ICD-10-CM

## 2024-06-12 DIAGNOSIS — J449 Chronic obstructive pulmonary disease, unspecified: Secondary | ICD-10-CM

## 2024-06-12 MED ORDER — CLINDAMYCIN PHOS (TWICE-DAILY) 1 % EX GEL: Freq: Two times a day (BID) | CUTANEOUS | 1 refills | Status: AC

## 2024-06-12 NOTE — Assessment & Plan Note (Addendum)
 BP Readings from Last 1 Encounters:  06/12/24 114/79   Well-controlled Takes hydrochlorothiazide  25 mg QD PRN foe leg swelling Counseled for compliance with the medications Advised DASH diet and moderate exercise/walking as tolerated

## 2024-06-12 NOTE — Assessment & Plan Note (Signed)
 Overall well controlled with Vraylar 1.5 mg once daily, Seroquel 300 mg nightly, Lamictal 100 mg BID On prazosin 2 mg nightly, hydroxyzine 25 mg twice daily and BuSpar 15 mg 3 times daily for anxiety Followed by psychiatry - [facility]annifer [facility] [place]

## 2024-06-12 NOTE — Assessment & Plan Note (Signed)
 Lab Results  Component Value Date   HGBA1C 6.3 (H) 02/22/2024    Well-controlled currently, significantly improved compared to prior Associated with HTN, HLD and neuropathy CGM data reviewed - 95% in-range with no severe hyperglycemia, has not required ISS since starting Ozempic  On Lantus  40 units nightly with ISS  On Ozempic  1 mg QW and Jardiance 25 mg once daily Metformin was not considered due to her declining kidney function and other psychiatric medication interactions in the past  Checks blood glucose 3 times daily before meals and at bedtime, continues to benefit from CGM for better glycemic profile and regular insulin  dosages - has Dexcom G7 Advised to follow diabetic diet On statin F/u CMP and lipid panel Diabetic eye exam: Advised to follow up with Ophthalmology for diabetic eye exam  On gabapentin  600 mg 3 times daily for DM neuropathy and chronic neck, back pain

## 2024-06-12 NOTE — Progress Notes (Unsigned)
 Established Patient Office Visit  Subjective:  Patient ID: [name], female    DOB: 09/21/1975  Age: 48 y.o. MRN: [id number]  CC:  Chief Complaint  Patient presents with   Follow-up    4 month f/u , would like flu shot today and has been having skin boils, concerns about pulse.     HPI [name] is a 48 y.o. female with past medical history of HTN, type II DM, OSA, COPD, DDD of cervical and lumbar spine, bipolar disorder and morbid obesity who presents for f/u of her chronic medical conditions.  Type II DM with neuropathy: She has history of uncontrolled type II DM.  She takes Jardiance 25 mg QD and Lantus  40 units nightly with ISS.  She has started taking Ozempic  1 mg QW since the last visit, and has been tolerating it well.  She has not needed mealtime insulin  since starting Ozempic .  She has CGM, which was reviewed today.  Her blood glucose averages are as follows:  7-day: 122 30-day: 128 90 days: 133  Denies any recent episode of hypoglycemia.  She has chronic fatigue, but denies polyuria or polydipsia.  She also reports chronic, intermittent numbness of bilateral LE-takes gabapentin  600 mg 3 times daily.  HTN: Her BP is WNL. She takes PRN HCTZ 25 mg once daily. She denies any headache, dizziness, chest pain.  She reports intermittent palpitations recently.  Her watch had shown elevated HR on 2 occasions recently.  She has chronic dyspnea, likely due to COPD and OSA.  COPD: She smokes about 1.5 pack/day, but has been trying to cut down.  She has chronic, intermittent dyspnea.  She uses Stiolto and as needed albuterol .  She also has Combivent  for dyspnea or wheezing.  OSA: She has been able to get CPAP and has started using it regularly now. She is followed by sleep  specialist.  She has noticed improvement in chronic fatigue, hypersomnolence and dyspnea.  DDD of cervical and lumbar spine: She has chronic neck and back pain with radicular symptoms to UE and LE  respectively.  She uses a rolling walker for walking support.  She takes Norco as needed for severe pain, has about 30 tablets in 3 months.  Bipolar disorder: Followed by psychiatry in [place].  She is on Vraylar 1.5 mg QD, Seroquel  300 mg nightly, prazosin 2 mg nightly, Lamictal  100 mg twice daily and BuSpar 15 mg 3 times daily currently.  Recurrent boils: She reports recurrent boils in the groin area.  Denies any local itching or discharge currently.  Past Medical History:  Diagnosis Date   ADHD    Agoraphobia    Allergy 1980's   [place]   Anemia 2023   Anxiety 1988   Arthritis    Asthma 2001   Bipolar disorder (HCC)    Concussion    COPD (chronic obstructive pulmonary disease) (HCC) 2023   DDD (degenerative disc disease), lumbar    Depression 1988   After fathers death   Diabetes mellitus without complication (HCC) 2023   Fibromyalgia    GERD (gastroesophageal reflux disease)    History of pneumonia 2015   History of substance abuse (HCC)    Hypertension 2013   Migraine    Mixed hyperlipidemia    Morbid obesity (HCC)    Neuropathy    OSA (obstructive sleep apnea)    Plantar fascial fibromatosis    Sleep apnea 2021   Spondylosis    Vitamin D  deficiency  03/27/2016    Past Surgical History:  Procedure Laterality Date   CYST REMOVAL HAND Left    ESOPHAGEAL DILATION     ESOPHAGOGASTRODUODENOSCOPY  2006   Dr. Rehman:small hh, esophageal dilation.   ESOPHAGOGASTRODUODENOSCOPY (EGD) WITH PROPOFOL  N/A 05/15/2019   Dr. Sander: Esophagitis with circumferential erosions or excoriations involving 10 cm segment of mid esophagus with overlying exudate.  Most proximal and distal esophagus appeared normal.  Biopsies revealed inflammation only.  Esophagus dilated.   FOOT SURGERY Right    'knot removed   MALONEY DILATION N/A 05/15/2019   Procedure: MALONEY DILATION;  Surgeon: Shaaron Lamar HERO, MD;  Location: [facility];  Service: Endoscopy;  Laterality: N/A;   MULTIPLE  EXTRACTIONS WITH ALVEOLOPLASTY N/A 05/29/2016   Procedure: MULTIPLE EXTRACTIONS;  Surgeon: Glendia Primrose, DDS;  Location: [facility] OR;  Service: Oral Surgery;  Laterality: N/A;   WISDOM TOOTH EXTRACTION      Family History  Problem Relation Age of Onset   ADD / ADHD Brother    Hypertension Brother    Arthritis Maternal Grandmother    Hypertension Maternal Grandmother    Anemia Maternal Grandmother    Arthritis Maternal Grandfather    Asthma Maternal Grandfather    Hypertension Maternal Grandfather    Colon polyps Maternal Grandfather    Diabetes Paternal Grandmother    Heart disease Paternal Grandmother    Arthritis Paternal Grandmother    Breast cancer Paternal Grandmother    COPD Paternal Grandmother    Depression Paternal Grandmother    Hypertension Paternal Grandmother    Varicose Veins Paternal Grandmother    Lung cancer Paternal Grandmother    Diabetes Paternal Grandfather    Heart disease Paternal Grandfather    Stroke Paternal Grandfather    Hypertension Paternal Grandfather    Kidney disease Paternal Grandfather    Obesity Paternal Actor    ADD / ADHD Daughter    Anxiety disorder Daughter    Early death Paternal Aunt    Heart disease Paternal Aunt    Hypertension Paternal Aunt     Social History   Socioeconomic History   Marital status: Single    Spouse name: Not on file   Number of children: Not on file   Years of education: Not on file   Highest education level: Associate degree: occupational, scientist, product/process development, or vocational program  Occupational History   Not on file  Tobacco Use   Smoking status: Every Day    Current packs/day: 1.00    Average packs/day: 1 pack/day for 10.0 years (10.0 ttl pk-yrs)    Types: Cigarettes   Smokeless tobacco: Never   Tobacco comments:    Pt smokes 1 ppd as of 06/12/2024  Vaping Use   Vaping status: Never Used  Substance and Sexual Activity   Alcohol use: Not Currently    Comment: heavy etoh 15 years ago (04/2024)   Drug  use: Not Currently    Types: Cocaine    Comment: tried cocaine (none for 15 years (04/2024)   Sexual activity: Not Currently    Partners: Male    Birth control/protection: None, Abstinence  Other Topics Concern   Not on file  Social History Narrative   Not on file   Social Drivers of Health   Financial Resource Strain: Low Risk  (06/11/2024)   Overall Financial Resource Strain (CARDIA)    Difficulty of Paying Living Expenses: Not hard at all  Food Insecurity: No Food Insecurity (06/11/2024)   Hunger Vital Sign    Worried About  Running Out of Food in the Last Year: Never true    Ran Out of Food in the Last Year: Never true  Transportation Needs: No Transportation Needs (06/11/2024)   PRAPARE - Administrator, Civil Service (Medical): No    Lack of Transportation (Non-Medical): No  Physical Activity: Inactive (06/11/2024)   Exercise Vital Sign    Days of Exercise per Week: 0 days    Minutes of Exercise per Session: Not on file  Stress: No Stress Concern Present (06/11/2024)   [employer] of Occupational Health - Occupational Stress Questionnaire    Feeling of Stress: Only a little  Social Connections: Socially Isolated (06/11/2024)   Social Connection and Isolation Panel    Frequency of Communication with Friends and Family: Once a week    Frequency of Social Gatherings with Friends and Family: Twice a week    Attends Religious Services: Never    Database Administrator or Organizations: No    Attends Engineer, Structural: Not on file    Marital Status: Never married  Intimate Partner Violence: Not At Risk (11/13/2021)   Humiliation, Afraid, Rape, and Kick questionnaire    Fear of Current or Ex-Partner: No    Emotionally Abused: No    Physically Abused: No    Sexually Abused: No    ROS Review of Systems  Constitutional:  Positive for fatigue. Negative for chills and fever.  HENT:  Positive for congestion. Negative for sore throat.   Eyes:   Negative for pain and discharge.  Respiratory:  Positive for shortness of breath (Intermittent). Negative for cough.   Cardiovascular:  Positive for palpitations (Intermittent). Negative for chest pain.  Gastrointestinal:  Negative for abdominal pain, diarrhea, nausea and vomiting.  Endocrine: Negative for polydipsia and polyuria.  Genitourinary:  Negative for dysuria and hematuria.  Musculoskeletal:  Positive for arthralgias, back pain and neck pain. Negative for neck stiffness.  Skin:  Negative for rash.  Neurological:  Negative for dizziness and weakness.  Psychiatric/Behavioral:  Positive for sleep disturbance. Negative for agitation and behavioral problems. The patient is nervous/anxious.     Objective:   Today's Vitals: BP 114/79   Pulse 95   Ht 5' 6 (1.676 m)   Wt (!) 319 lb 12.8 oz (145.1 kg)   SpO2 94%   BMI 51.62 kg/m   Physical Exam Vitals reviewed.  Constitutional:      General: She is not in acute distress.    Appearance: She is obese. She is not diaphoretic.     Comments: Has a rolling walker  HENT:     Head: Normocephalic and atraumatic.     Nose: Nose normal.     Mouth/Throat:     Mouth: Mucous membranes are moist.  Eyes:     General: No scleral icterus.    Extraocular Movements: Extraocular movements intact.  Cardiovascular:     Rate and Rhythm: Normal rate and regular rhythm.     Heart sounds: Normal heart sounds. No murmur heard. Pulmonary:     Breath sounds: Normal breath sounds. No wheezing or rales.  Musculoskeletal:     Cervical back: Neck supple. No tenderness.     Right lower leg: No edema.     Left lower leg: No edema.  Skin:    General: Skin is warm.     Findings: No rash.  Neurological:     General: No focal deficit present.     Mental Status: She is alert and oriented to  person, place, and time.     Sensory: Sensory deficit (B/l LE) present.     Motor: Weakness (B/l LE - 4/5) present.  Psychiatric:        Mood and Affect: Mood  normal.        Behavior: Behavior normal.     Assessment & Plan:   Problem List Items Addressed This Visit       Cardiovascular and Mediastinum   Essential hypertension   BP Readings from Last 1 Encounters:  06/12/24 114/79   Well-controlled Takes hydrochlorothiazide  25 mg QD PRN foe leg swelling Counseled for compliance with the medications Advised DASH diet and moderate exercise/walking as tolerated      Relevant Orders   EKG 12-Lead (Completed)     Respiratory   Chronic obstructive pulmonary disease (HCC)   Overall well-controlled with Stiolto and as needed Combivent  Followed by Pulmonology Needs to cut down -> quit smoking        Endocrine   Type 2 diabetes mellitus with other specified complication (HCC) - Primary   Lab Results  Component Value Date   HGBA1C 6.3 (H) 02/22/2024    Well-controlled currently, significantly improved compared to prior Associated with HTN, HLD and neuropathy CGM data reviewed - 95% in-range with no severe hyperglycemia, has not required ISS since starting Ozempic  On Lantus  40 units nightly with ISS  On Ozempic  1 mg QW and Jardiance 25 mg once daily Metformin was not considered due to her declining kidney function and other psychiatric medication interactions in the past  Checks blood glucose 3 times daily before meals and at bedtime, continues to benefit from CGM for better glycemic profile and regular insulin  dosages - has Dexcom G7 Advised to follow diabetic diet On statin F/u CMP and lipid panel Diabetic eye exam: Advised to follow up with Ophthalmology for diabetic eye exam  On gabapentin  600 mg 3 times daily for DM neuropathy and chronic neck, back pain      Relevant Orders   CMP14+EGFR (Completed)   Hemoglobin A1c (Completed)   Diabetic polyneuropathy associated with type 2 diabetes mellitus (HCC)   On gabapentin  600 mg 3 times daily, refilled        Musculoskeletal and Integument   Folliculitis   Recurrent boils  in the groin area Clindamycin gel prescribed Advised to keep area clean and dry      Relevant Medications   clindamycin (CLINDAGEL) 1 % gel     Other   Bipolar 1 disorder, mixed, moderate (HCC)   Overall well controlled with Vraylar 1.5 mg once daily, Seroquel  300 mg nightly, Lamictal  100 mg BID On prazosin 2 mg nightly, hydroxyzine 25 mg twice daily and BuSpar 15 mg 3 times daily for anxiety Followed by psychiatry - Dr [facility]ay[facility]      Heart palpitations   EKG: Sinus rhythm. HR 85.  Prolonged PR interval.  No signs of active ischemia.  Her watch showed HR up to 169, lasting for 3 mins on 1 occasion and up to 159 lasting for 2 mins above 150 - both episodes in sitting position Concern for SVT  Has had Zio patch monitor - had atrial and ventricular ectopy in 2023 Advised to contact Cardiology office for recurrent episode      Other Visit Diagnoses       Breast cancer screening by mammogram       Relevant Orders   MM 3D DIAGNOSTIC MAMMOGRAM BILATERAL BREAST     Encounter  for immunization       Relevant Orders   Flu vaccine trivalent PF, 6mos and older(Flulaval,Afluria,Fluarix,Fluzone) (Completed)         Outpatient Encounter Medications as of 06/12/2024  Medication Sig   albuterol  (PROVENTIL ) (2.5 MG/3ML) 0.083% nebulizer solution Take 3 mLs (2.5 mg total) by nebulization every 6 (six) hours as needed for wheezing or shortness of breath.   atorvastatin  (LIPITOR) 20 MG tablet TAKE 1 TABLET BY MOUTH EVERY MORNING FOR high cholesterol   busPIRone (BUSPAR) 15 MG tablet Take 15 mg by mouth 3 (three) times daily.   clindamycin (CLINDAGEL) 1 % gel Apply topically 2 (two) times daily.   COMBIVENT  RESPIMAT 20-100 MCG/ACT AERS respimat INHALE 1 PUFF INTO THE LUNGS EVERY 6 HOURS AS NEEDED FOR WHEEZING   Continuous Glucose Receiver (DEXCOM G7 RECEIVER) DEVI Use it to check blood glucose 3 times before meals, at bedtime and as needed.    Continuous Glucose Sensor (DEXCOM G7 SENSOR) MISC USE AS DIRECTED TO MONITOR BLOOD SUGAR LEVELS DAILY. CHANGE SENSOR EVERY 10 DAYS. check levels BEFORE MEALS AND AT BEDTIME AS NEEDED   Cyanocobalamin  (VITAMIN B-12) 2000 MCG TBCR Take 1 tablet by mouth daily.   fluticasone  (FLONASE ) 50 MCG/ACT nasal spray Place 2 sprays into both nostrils daily.   gabapentin  (NEURONTIN ) 600 MG tablet Take 1 tablet (600 mg total) by mouth 3 (three) times daily.   hydrochlorothiazide  (HYDRODIURIL ) 25 MG tablet Take 25 mg by mouth daily as needed (Leg swelling).   HYDROcodone -acetaminophen  (NORCO) 10-325 MG tablet TAKE 1 TABLET BY MOUTH DAILY AS NEEDED   hydrOXYzine (ATARAX) 25 MG tablet Take 25 mg by mouth 2 (two) times daily.   insulin  aspart (NOVOLOG  FLEXPEN) 100 UNIT/ML FlexPen Use sliding scale until you follow up with your PCP and establish a regimen for meals.   Insulin  Pen Needle 32G X 4 MM MISC 1 Needle by Does not apply route as needed.   JARDIANCE 25 MG TABS tablet TAKE 1 TABLET BY MOUTH DAILY IN THE MORNING   lamoTRIgine  (LAMICTAL ) 100 MG tablet Take 100 mg by mouth 2 (two) times daily.   LANTUS  SOLOSTAR 100 UNIT/ML Solostar Pen INJECT 50 UNITS UNDER THE SKIN AT BEDTIME   lubiprostone  (AMITIZA ) 24 MCG capsule Take 1 capsule (24 mcg total) by mouth 2 (two) times daily with a meal.   medroxyPROGESTERone  (PROVERA ) 10 MG tablet TAKE 1 TABLET BY MOUTH DAILY FOR THE first 10 DAYS of each calender MONTH   methocarbamol  (ROBAXIN ) 750 MG tablet TAKE 1 TABLET BY MOUTH UP TO FOUR TIMES DAILY FOR THE PAIN OF FIBROMYALGIA   nicotine  (NICODERM CQ  - DOSED IN MG/24 HOURS) 21 mg/24hr patch RX #1 Weeks 1-4: 21 mg x 1 patch daily. Wear for 24 hours. If you have sleep disturbances, remove at bedtime.   nicotine  polacrilex (COMMIT) 4 MG lozenge RX #1 Weeks 1-6: 1 lozenge every 1-2 hours. Use at least 9 lozenges per day for the first 6 weeks. Max 20 lozenges per day.   omeprazole (PRILOSEC) 40 MG capsule TAKE ONE CAPSULE BY MOUTH  TWICE DAILY FOR acid reflux   ondansetron  (ZOFRAN ) 4 MG tablet Take 4 mg by mouth 3 (three) times daily as needed for nausea or vomiting.   OZEMPIC , 1 MG/DOSE, 4 MG/3ML SOPN INJECT 1 MG UNDER THE SKIN ONCE A WEEK   prazosin (MINIPRESS) 2 MG capsule Take 2 mg by mouth at bedtime.   Prenatal Vit-Fe Fumarate-FA (PRENATAL COMPLETE PO) Take 1 tablet by mouth daily.  QUEtiapine  (SEROQUEL ) 300 MG tablet Take 300 mg by mouth at bedtime.   silver  sulfADIAZINE  (SILVADENE ) 1 % cream Use to areas 2-3 times daily as needed   Tiotropium Bromide-Olodaterol (STIOLTO RESPIMAT ) 2.5-2.5 MCG/ACT AERS Inhale 2 puffs into the lungs daily.   topiramate  (TOPAMAX ) 100 MG tablet Take 100 mg by mouth daily.   Vitamin D , Ergocalciferol , (DRISDOL ) 1.25 MG (50000 UNIT) CAPS capsule Take 1 capsule (50,000 Units total) by mouth every 7 (seven) days.   VRAYLAR 1.5 MG capsule TAKE ONE CAPSULE BY MOUTH DAILY   No facility-administered encounter medications on file as of 06/12/2024.    Follow-up: Return in about 4 months (around 10/11/2024) for DM.   Suzzane MARLA Blanch, MD

## 2024-06-12 NOTE — Assessment & Plan Note (Addendum)
 Her watch showed HR up to 169, lasting for 3 mins on 1 occasion and up to 159 lasting for 2 mins above 150 - both episodes in sitting position Concern for SVT  Has had Zio patch monitor - had atrial and ventricular ectopy in 2023 Advised to contact Cardiology office for recurrent episode

## 2024-06-12 NOTE — Assessment & Plan Note (Signed)
 On gabapentin  600 mg 3 times daily, refilled

## 2024-06-12 NOTE — Patient Instructions (Addendum)
 Please start taking Lantus  36 Units instead of 40 Units.  Please continue to take medications as prescribed.  Please continue to follow low carb diet and perform moderate exercise/walking as tolerated.

## 2024-06-12 NOTE — Assessment & Plan Note (Signed)
 Overall well-controlled with Stiolto and as needed Combivent  Followed by Pulmonology Needs to cut down -> quit smoking

## 2024-06-13 ENCOUNTER — Ambulatory Visit: Payer: Self-pay | Admitting: Internal Medicine

## 2024-06-13 LAB — CMP14+EGFR
ALT: 17 IU/L (ref 0–32)
AST: 14 IU/L (ref 0–40)
Albumin: 4.3 g/dL (ref 3.9–4.9)
Alkaline Phosphatase: 165 IU/L — ABNORMAL HIGH (ref 41–116)
BUN/Creatinine Ratio: 9 (ref 9–23)
BUN: 10 mg/dL (ref 6–24)
Bilirubin Total: 0.3 mg/dL (ref 0.0–1.2)
CO2: 21 mmol/L (ref 20–29)
Calcium: 9.7 mg/dL (ref 8.7–10.2)
Chloride: 105 mmol/L (ref 96–106)
Creatinine, Ser: 1.08 mg/dL — ABNORMAL HIGH (ref 0.57–1.00)
Globulin, Total: 2.7 g/dL (ref 1.5–4.5)
Glucose: 87 mg/dL (ref 70–99)
Potassium: 4.2 mmol/L (ref 3.5–5.2)
Sodium: 140 mmol/L (ref 134–144)
Total Protein: 7 g/dL (ref 6.0–8.5)
eGFR: 64 mL/min/1.73 (ref >59)

## 2024-06-13 LAB — HEMOGLOBIN A1C
Est. average glucose Bld gHb Est-mCnc: 131 mg/dL
Hgb A1c MFr Bld: 6.2 % — ABNORMAL HIGH (ref 4.8–5.6)

## 2024-06-15 NOTE — Assessment & Plan Note (Signed)
 Recurrent boils in the groin area Clindamycin gel prescribed Advised to keep area clean and dry

## 2024-06-28 DIAGNOSIS — I10 Essential (primary) hypertension: Secondary | ICD-10-CM | POA: Diagnosis not present

## 2024-07-05 ENCOUNTER — Other Ambulatory Visit: Payer: Self-pay | Admitting: Internal Medicine

## 2024-07-11 ENCOUNTER — Other Ambulatory Visit (HOSPITAL_COMMUNITY): Payer: Self-pay | Admitting: Internal Medicine

## 2024-07-11 DIAGNOSIS — N6310 Unspecified lump in the right breast, unspecified quadrant: Secondary | ICD-10-CM

## 2024-07-11 DIAGNOSIS — N63 Unspecified lump in unspecified breast: Secondary | ICD-10-CM

## 2024-07-16 ENCOUNTER — Other Ambulatory Visit: Payer: Self-pay | Admitting: Internal Medicine

## 2024-07-16 DIAGNOSIS — E1169 Type 2 diabetes mellitus with other specified complication: Secondary | ICD-10-CM

## 2024-07-18 ENCOUNTER — Ambulatory Visit: Admitting: Pulmonary Disease

## 2024-07-18 ENCOUNTER — Encounter: Payer: Self-pay | Admitting: Pulmonary Disease

## 2024-07-18 VITALS — BP 107/69 | HR 106 | Ht 66.0 in | Wt 324.0 lb

## 2024-07-18 DIAGNOSIS — J449 Chronic obstructive pulmonary disease, unspecified: Secondary | ICD-10-CM | POA: Diagnosis not present

## 2024-07-18 DIAGNOSIS — F17210 Nicotine dependence, cigarettes, uncomplicated: Secondary | ICD-10-CM | POA: Diagnosis not present

## 2024-07-18 DIAGNOSIS — Z6841 Body Mass Index (BMI) 40.0 and over, adult: Secondary | ICD-10-CM

## 2024-07-18 DIAGNOSIS — G4733 Obstructive sleep apnea (adult) (pediatric): Secondary | ICD-10-CM

## 2024-07-18 MED ORDER — NICOTINE 21 MG/24HR TD PT24: MEDICATED_PATCH | TRANSDERMAL | 6 refills | Status: AC

## 2024-07-18 NOTE — Patient Instructions (Signed)
" °  VISIT SUMMARY: You had a follow-up visit to discuss your CPAP usage and respiratory symptoms. Your CPAP therapy is well-managed, but you still experience fatigue and daytime drowsiness. We also reviewed your COPD management and smoking cessation efforts.  YOUR PLAN: OBSTRUCTIVE SLEEP APNEA: Your CPAP therapy is well-managed with a usage rate of 98% and a residual AHI of 0.1. However, you still experience fatigue and daytime drowsiness. -Continue using your CPAP machine with the current settings.  CHRONIC OBSTRUCTIVE PULMONARY DISEASE (COPD): You have mild COPD with some air trapping and mildly reduced diffusion capacity. Your shortness of breath worsens with physical activity and possibly weight. -Continue using Stiolto Respimat , 2 puffs daily in the evening. -Continue using Combivent  Respimat every 6 hours as needed. -Use your albuterol  nebulizer as needed, especially during exacerbations. -Continue taking Mucinex twice daily to manage congestion. -Work on weight loss to improve your respiratory symptoms. -Stop smoking to prevent COPD from getting worse.  CIGARETTE NICOTINE  DEPENDENCE: You have reduced your smoking from two packs to one pack per day using nicotine  patches and lozenges. Lozenges are preferred due to recent dental procedures. -Continue using nicotine  patches, 20 mg daily. -Continue using nicotine  lozenges as needed. -Aim to further reduce your smoking to half a pack per day.  Contains text generated by [employer].   "

## 2024-07-18 NOTE — Assessment & Plan Note (Signed)
 Chronic obstructive pulmonary disease Mild COPD with FEV1 at 89% and FVC at 100%. Ratio is 69%, indicating mild obstruction. Total lung capacity is elevated at 120%, and residual volume is 140%, indicating hyperinflation. Diffusion capacity is mildly reduced at 73%. Shortness of breath exacerbated by physical activity and possibly weight. Smoking cessation is crucial to prevent progression. Class B, Grade I. - Continue Stiolto Respimat  2 puffs daily in the evening. - Continue Combivent  Respimat every 6 hours as needed. - Use albuterol  nebulizer as needed, especially during exacerbations. - Continue Mucinex twice daily to manage congestion. - Encouraged weight loss to improve respiratory symptoms. - Advised smoking cessation to prevent COPD progression.

## 2024-07-18 NOTE — Assessment & Plan Note (Signed)
 Obstructive sleep apnea Well-managed with CPAP therapy. Usage is 98% with an average of 6.5 hours per night. Residual AHI is 0.1, indicating excellent control. Persistent fatigue and daytime drowsiness reported. - Continue CPAP therapy with current settings. - Advised weight loss to help improve OSA

## 2024-07-18 NOTE — Progress Notes (Signed)
 "  Established Patient Pulmonology Office Visit   Subjective:  Patient ID: [name], female    DOB: Mar 02, 1976  MRN: [id number]  CC:  Chief Complaint  Patient presents with   Sleep Apnea   COPD    Pft Shob // coughing yellowish     HPI  Ms. [name] is a 49 y/o F with a PMH significant for bipolar disorder, DM, GERD, COPD?, and obstructive sleep apnea who presents for follow up.   She was last seen on 03/14/2024, at the time, the patient was compliant to autoCPAP 5-15 cm H2O. For COPD, I switched her from Symbicort to Stiolto and referred her to pulmonary rehab and ordered PFTs. We discussed smoking cessation.  Discussed the use of AI scribe software for clinical note transcription with the patient, who gave verbal consent to proceed.  History of Present Illness [name] is a 49 year old female with sleep apnea and COPD who presents for follow-up of her CPAP usage and respiratory symptoms.  She uses her CPAP machine consistently, with a usage rate of 98% over the past 90 days, averaging at least 6 hours and 30 minutes per night. Her CPAP pressure is set between 5 to 15, and she has minimal air leak due to regular mask cushion changes. Despite a residual AHI of 0.1, she continues to experience fatigue and daytime drowsiness, often feeling the need to nap, during which she does not use her CPAP machine.  She has a history of smoking and is currently using nicotine  patches, having reduced her smoking from two packs to one pack per day. She finds nicotine  lozenges more suitable than gum due to recent dental extractions. She uses Stiolto Respimat , two puffs in the evening, to manage her COPD symptoms, particularly shortness of breath that worsens with evening activity. She has stopped using Symbicort. Additionally, she uses Combivent  every six hours daily to manage shortness of breath, and albuterol  in a nebulizer two to three times a month during exacerbations.  She has experienced two  bronchitis flare-ups this year, managed with low-dose prednisone  prescribed by her primary doctor, without affecting her blood sugar levels. Her FEV1 is 2.75 liters (89% predicted), and her forced vital capacity is 4 liters (100% predicted). Her FEV1/FVC ratio is 69%. Her total lung capacity is 120%, and her residual volume is 140%, both elevated. Her diffusion capacity is 73%, mildly reduced.  She is on Ozempic  for diabetes, which has also aided in weight loss of approximately 20 pounds. She uses Mucinex twice daily to manage congestion and prevent coughing fits, which helps in loosening phlegm. She has a nebulizer at home and uses it when symptoms worsen. She received a flu shot this year and avoids crowded places to minimize infection risk. Her grandson, who attends daycare, is often the source of her illnesses.  CAT > 10, MMRC > 2 ROS    Current Medications[1]      Objective:  BP 107/69   Pulse (!) 106   Ht 5' 6 (1.676 m)   Wt (!) 324 lb (147 kg)   LMP 09/11/2022   SpO2 93% Comment: ra  BMI 52.29 kg/m  Wt Readings from Last 3 Encounters:  07/18/24 (!) 324 lb (147 kg)  06/12/24 (!) 319 lb 12.8 oz (145.1 kg)  04/24/24 (!) 315 lb 9.6 oz (143.2 kg)   BMI Readings from Last 3 Encounters:  07/18/24 52.29 kg/m  06/12/24 51.62 kg/m  04/24/24 50.94 kg/m   SpO2 Readings from Last  3 Encounters:  07/18/24 93%  06/12/24 94%  04/24/24 95%   Physical Exam General: NAD, alert, WD, WN Eyes: PERRL, no scleral icterus ENMT: oropharynx clear, good dentition, no oral lesions, mallampati score IV Skin: warm, intact, no rashes Neck: JVD flat, ROM and lymph node assessment normal CV: RRR, no MRG, nl S1 and S2, no peripheral edema Resp: scattered expiratory wheezing, no clubbing Neuro: Awake alert oriented to person place time and situation  Diagnostic Review:  Last CBC Lab Results  Component Value Date   WBC 11.5 (H) 02/22/2024   HGB 15.2 02/22/2024   HCT 44.9 02/22/2024   MCV 96  02/22/2024   MCH 32.5 02/22/2024   RDW 14.8 02/22/2024   PLT 202 02/22/2024   Last metabolic panel Lab Results  Component Value Date   GLUCOSE 87 06/12/2024   NA 140 06/12/2024   K 4.2 06/12/2024   CL 105 06/12/2024   CO2 21 06/12/2024   BUN 10 06/12/2024   CREATININE 1.08 (H) 06/12/2024   EGFR 64 06/12/2024   CALCIUM  9.7 06/12/2024   PROT 7.0 06/12/2024   ALBUMIN 4.3 06/12/2024   LABGLOB 2.7 06/12/2024   AGRATIO 1.6 03/19/2016   BILITOT 0.3 06/12/2024   ALKPHOS 165 (H) 06/12/2024   AST 14 06/12/2024   ALT 17 06/12/2024   ANIONGAP 10 09/11/2022   PFT: mild obstructive lung disease, mild reduction in DLCO. Absolute Eos Count 200 cells/uL CT C Spine 2022 shows mild upper lobe emphysematous changes. HST 12/09/23: AHI 15, mild to moderate OSA    Assessment & Plan:   Assessment & Plan Obstructive sleep apnea of adult Obstructive sleep apnea Well-managed with CPAP therapy. Usage is 98% with an average of 6.5 hours per night. Residual AHI is 0.1, indicating excellent control. Persistent fatigue and daytime drowsiness reported. - Continue CPAP therapy with current settings. - Advised weight loss to help improve OSA Chronic obstructive pulmonary disease, unspecified COPD type (HCC) Chronic obstructive pulmonary disease Mild COPD with FEV1 at 89% and FVC at 100%. Ratio is 69%, indicating mild obstruction. Total lung capacity is elevated at 120%, and residual volume is 140%, indicating hyperinflation. Diffusion capacity is mildly reduced at 73%. Shortness of breath exacerbated by physical activity and possibly weight. Smoking cessation is crucial to prevent progression. Class B, Grade I. - Continue Stiolto Respimat  2 puffs daily in the evening. - Continue Combivent  Respimat every 6 hours as needed. - Use albuterol  nebulizer as needed, especially during exacerbations. - Continue Mucinex twice daily to manage congestion. - Encouraged weight loss to improve respiratory symptoms. -  Advised smoking cessation to prevent COPD progression. Cigarette nicotine  dependence without complication Cigarette nicotine  dependence Currently smoking one pack per day, reduced from two packs with nicotine  patches and lozenges. Lozenges preferred over gums due to recent dental procedures. Smoking cessation is critical to prevent COPD progression. - Continue nicotine  patches 20 mg daily. - Continue nicotine  lozenges Q 2 H as needed. - Encouraged further reduction in smoking with a goal of half a pack per day.  Smoking/Tobacco Cessation Counseling [name] is a current user of tobacco or nicotine  products. She is ready to quit at this time. Counseling provided today addressed the risks of continued use and the benefits of cessation. Discussed tobacco/nicotine  use history, readiness to quit, and evidence-based treatment options including behavioral strategies, support resources, and pharmacologic therapies. Provided encouragement and educational materials on steps and resources to quit smoking. Patient questions were addressed, and follow-up recommended for continued support. Total time  spent on counseling: 5 minutes.  Morbid obesity (HCC) Discussed with the patient about the importance of maintaining a healthy weight and the positive impact it can have on lung function. Encouraged them to reduce caloric intake and increase activity.  No orders of the defined types were placed in this encounter.  I spent 30 minutes reviewing patient's chart including prior consultant notes, imaging, and PFTs as well as face-to-face with the patient, over half in discussion of the diagnosis and the importance of compliance with the treatment plan.  Return in about 6 months (around 01/15/2025).   Alletta Mattos, MD     [1]  Current Outpatient Medications:    albuterol  (PROVENTIL ) (2.5 MG/3ML) 0.083% nebulizer solution, Take 3 mLs (2.5 mg total) by nebulization every 6 (six) hours as needed for wheezing or  shortness of breath., Disp: 75 mL, Rfl: 5   atorvastatin  (LIPITOR) 20 MG tablet, TAKE 1 TABLET BY MOUTH EVERY MORNING FOR high cholesterol, Disp: 90 tablet, Rfl: 3   busPIRone (BUSPAR) 15 MG tablet, Take 15 mg by mouth 3 (three) times daily., Disp: , Rfl:    clindamycin  (CLINDAGEL) 1 % gel, Apply topically 2 (two) times daily., Disp: 60 g, Rfl: 1   COMBIVENT  RESPIMAT 20-100 MCG/ACT AERS respimat, INHALE 1 PUFF INTO THE LUNGS EVERY 6 HOURS AS NEEDED FOR WHEEZING, Disp: 4 g, Rfl: 5   Continuous Glucose Receiver (DEXCOM G7 RECEIVER) DEVI, Use it to check blood glucose 3 times before meals, at bedtime and as needed., Disp: 1 each, Rfl: 0   Continuous Glucose Sensor (DEXCOM G7 SENSOR) MISC, USE AS DIRECTED TO MONITOR BLOOD SUGAR LEVELS DAILY. CHANGE SENSOR EVERY 10 DAYS. check levels BEFORE MEALS AND AT BEDTIME AS NEEDED, Disp: 3 each, Rfl: 5   Cyanocobalamin  (VITAMIN B-12) 2000 MCG TBCR, Take 1 tablet by mouth daily., Disp: , Rfl:    fluticasone  (FLONASE ) 50 MCG/ACT nasal spray, Place 2 sprays into both nostrils daily., Disp: 16 g, Rfl: 6   gabapentin  (NEURONTIN ) 600 MG tablet, Take 1 tablet (600 mg total) by mouth 3 (three) times daily., Disp: 270 tablet, Rfl: 3   hydrochlorothiazide  (HYDRODIURIL ) 25 MG tablet, Take 25 mg by mouth daily as needed (Leg swelling)., Disp: , Rfl:    HYDROcodone -acetaminophen  (NORCO) 10-325 MG tablet, TAKE 1 TABLET BY MOUTH DAILY AS NEEDED, Disp: 30 tablet, Rfl: 0   hydrOXYzine (ATARAX) 25 MG tablet, Take 25 mg by mouth 2 (two) times daily., Disp: , Rfl:    insulin  aspart (NOVOLOG  FLEXPEN) 100 UNIT/ML FlexPen, Use sliding scale until you follow up with your PCP and establish a regimen for meals., Disp: 15 mL, Rfl: 11   Insulin  Pen Needle 32G X 4 MM MISC, 1 Needle by Does not apply route as needed., Disp: 30 each, Rfl: 1   JARDIANCE 25 MG TABS tablet, TAKE 1 TABLET BY MOUTH DAILY IN THE MORNING, Disp: 90 tablet, Rfl: 3   lamoTRIgine  (LAMICTAL ) 100 MG tablet, Take 100 mg by  mouth 2 (two) times daily., Disp: , Rfl:    LANTUS  SOLOSTAR 100 UNIT/ML Solostar Pen, INJECT 50 UNITS UNDER THE SKIN AT BEDTIME, Disp: 75 mL, Rfl: 2   lubiprostone  (AMITIZA ) 24 MCG capsule, Take 1 capsule (24 mcg total) by mouth 2 (two) times daily with a meal., Disp: 180 capsule, Rfl: 3   medroxyPROGESTERone  (PROVERA ) 10 MG tablet, TAKE 1 TABLET BY MOUTH DAILY FOR THE first 10 DAYS of each calender MONTH, Disp: 10 tablet, Rfl: 11   methocarbamol  (ROBAXIN ) 750 MG  tablet, TAKE 1 TABLET BY MOUTH UP TO FOUR TIMES DAILY FOR THE PAIN OF FIBROMYALGIA, Disp: 120 tablet, Rfl: 3   nicotine  polacrilex (COMMIT) 4 MG lozenge, RX #1 Weeks 1-6: 1 lozenge every 1-2 hours. Use at least 9 lozenges per day for the first 6 weeks. Max 20 lozenges per day., Disp: 1008 lozenge, Rfl: 0   omeprazole (PRILOSEC) 40 MG capsule, TAKE ONE CAPSULE BY MOUTH TWICE DAILY FOR acid reflux, Disp: 180 capsule, Rfl: 3   ondansetron  (ZOFRAN ) 4 MG tablet, Take 4 mg by mouth 3 (three) times daily as needed for nausea or vomiting., Disp: , Rfl:    OZEMPIC , 1 MG/DOSE, 4 MG/3ML SOPN, INJECT 1 MG UNDER THE SKIN ONCE A WEEK, Disp: 3 mL, Rfl: 3   prazosin (MINIPRESS) 2 MG capsule, Take 2 mg by mouth at bedtime., Disp: , Rfl:    Prenatal Vit-Fe Fumarate-FA (PRENATAL COMPLETE PO), Take 1 tablet by mouth daily. , Disp: , Rfl:    QUEtiapine  (SEROQUEL ) 300 MG tablet, Take 300 mg by mouth at bedtime., Disp: , Rfl:    silver  sulfADIAZINE  (SILVADENE ) 1 % cream, Use to areas 2-3 times daily as needed, Disp: 50 g, Rfl: 11   Tiotropium Bromide-Olodaterol (STIOLTO RESPIMAT ) 2.5-2.5 MCG/ACT AERS, Inhale 2 puffs into the lungs daily., Disp: 1 each, Rfl: 6   topiramate  (TOPAMAX ) 100 MG tablet, Take 100 mg by mouth daily., Disp: , Rfl:    Vitamin D , Ergocalciferol , (DRISDOL ) 1.25 MG (50000 UNIT) CAPS capsule, Take 1 capsule (50,000 Units total) by mouth every 7 (seven) days., Disp: 12 capsule, Rfl: 1   VRAYLAR 1.5 MG capsule, TAKE ONE CAPSULE BY MOUTH DAILY,  Disp: 90 capsule, Rfl: 3   nicotine  (NICODERM CQ  - DOSED IN MG/24 HOURS) 21 mg/24hr patch, RX #1 Weeks 1-4: 21 mg x 1 patch daily. Wear for 24 hours. If you have sleep disturbances, remove at bedtime., Disp: 28 patch, Rfl: 6  "

## 2024-07-18 NOTE — Assessment & Plan Note (Signed)
 Discussed with the patient about the importance of maintaining a healthy weight and the positive impact it can have on lung function. Encouraged them to reduce caloric intake and increase activity.

## 2024-07-26 ENCOUNTER — Other Ambulatory Visit: Payer: Self-pay | Admitting: Internal Medicine

## 2024-07-27 ENCOUNTER — Ambulatory Visit (HOSPITAL_COMMUNITY)
Admission: RE | Admit: 2024-07-27 | Discharge: 2024-07-27 | Disposition: A | Discharge status: Home / Self Care | Source: Ambulatory Visit | Attending: Internal Medicine | Admitting: Internal Medicine

## 2024-07-27 ENCOUNTER — Encounter (HOSPITAL_COMMUNITY): Payer: Self-pay

## 2024-07-27 ENCOUNTER — Ambulatory Visit (HOSPITAL_COMMUNITY)
Admission: RE | Admit: 2024-07-27 | Discharge: 2024-07-27 | Disposition: A | Source: Ambulatory Visit | Attending: Internal Medicine

## 2024-07-27 ENCOUNTER — Other Ambulatory Visit (HOSPITAL_COMMUNITY): Payer: Self-pay | Admitting: Internal Medicine

## 2024-07-27 ENCOUNTER — Ambulatory Visit (HOSPITAL_COMMUNITY): Admission: RE | Admit: 2024-07-27 | Discharge: 2024-07-27 | Attending: Internal Medicine

## 2024-07-27 DIAGNOSIS — R928 Other abnormal and inconclusive findings on diagnostic imaging of breast: Secondary | ICD-10-CM

## 2024-07-27 DIAGNOSIS — N63 Unspecified lump in unspecified breast: Secondary | ICD-10-CM

## 2024-07-27 DIAGNOSIS — N6325 Unspecified lump in the left breast, overlapping quadrants: Secondary | ICD-10-CM | POA: Insufficient documentation

## 2024-07-27 DIAGNOSIS — N6310 Unspecified lump in the right breast, unspecified quadrant: Secondary | ICD-10-CM | POA: Insufficient documentation

## 2024-07-27 DIAGNOSIS — N6320 Unspecified lump in the left breast, unspecified quadrant: Secondary | ICD-10-CM | POA: Insufficient documentation

## 2024-08-07 ENCOUNTER — Other Ambulatory Visit (HOSPITAL_COMMUNITY): Payer: Self-pay | Admitting: Internal Medicine

## 2024-08-07 DIAGNOSIS — R928 Other abnormal and inconclusive findings on diagnostic imaging of breast: Secondary | ICD-10-CM

## 2024-08-08 ENCOUNTER — Inpatient Hospital Stay (HOSPITAL_COMMUNITY): Admission: RE | Admit: 2024-08-08 | Discharge: 2024-08-08 | Attending: Internal Medicine

## 2024-08-08 ENCOUNTER — Ambulatory Visit (HOSPITAL_COMMUNITY)
Admission: RE | Admit: 2024-08-08 | Discharge: 2024-08-08 | Disposition: A | Discharge status: Home / Self Care | Source: Ambulatory Visit | Attending: Internal Medicine | Admitting: Internal Medicine

## 2024-08-08 DIAGNOSIS — E559 Vitamin D deficiency, unspecified: Secondary | ICD-10-CM

## 2024-08-08 DIAGNOSIS — R928 Other abnormal and inconclusive findings on diagnostic imaging of breast: Secondary | ICD-10-CM | POA: Diagnosis present

## 2024-08-08 MED ORDER — LIDOCAINE-EPINEPHRINE (PF) 1 %-1:200000 IJ SOLN
30.0000 mL | Freq: Once | INTRAMUSCULAR | Status: AC
  Administered 2024-08-08: 10 mL via INTRADERMAL

## 2024-08-08 MED ORDER — LIDOCAINE HCL (PF) 2 % IJ SOLN
10.0000 mL | Freq: Once | INTRAMUSCULAR | Status: AC
  Administered 2024-08-08: 10 mL via INTRADERMAL

## 2024-08-08 MED ORDER — LIDOCAINE HCL (PF) 2 % IJ SOLN
INTRAMUSCULAR | Status: AC
  Filled 2024-08-08: qty 10

## 2024-08-08 NOTE — Progress Notes (Signed)
 Patient brought to US  in no acute distress. Breat biopsy explained, consent obtained. Prepped and draped in sterile manner. Local anesthetic admin with no complication. Access obtained with US  imaging. Samples procured, collected in solution. Access withdrawn ueventfully, bandage placed. No bleeding/hematoma apparent. DC instructions provided. Escorted to mammogram for imaging.

## 2024-08-09 ENCOUNTER — Encounter: Payer: Self-pay | Admitting: Gastroenterology

## 2024-08-09 ENCOUNTER — Ambulatory Visit: Admitting: Gastroenterology

## 2024-08-09 ENCOUNTER — Encounter: Payer: Self-pay | Admitting: *Deleted

## 2024-08-09 VITALS — BP 109/71 | HR 102 | Temp 97.9°F | Ht 66.0 in | Wt 326.6 lb

## 2024-08-09 DIAGNOSIS — R748 Abnormal levels of other serum enzymes: Secondary | ICD-10-CM

## 2024-08-09 DIAGNOSIS — K5904 Chronic idiopathic constipation: Secondary | ICD-10-CM

## 2024-08-09 DIAGNOSIS — R7989 Other specified abnormal findings of blood chemistry: Secondary | ICD-10-CM | POA: Insufficient documentation

## 2024-08-09 DIAGNOSIS — K219 Gastro-esophageal reflux disease without esophagitis: Secondary | ICD-10-CM

## 2024-08-09 DIAGNOSIS — Z1211 Encounter for screening for malignant neoplasm of colon: Secondary | ICD-10-CM

## 2024-08-09 LAB — SURGICAL PATHOLOGY

## 2024-08-09 NOTE — Patient Instructions (Signed)
 Complete labs at [facility] at your convenience.  Abdominal ultrasound to be ordered.  Complete cologuard. Continue omeprazole and amitiza .

## 2024-08-09 NOTE — Progress Notes (Signed)
 "    GI Office Note    Referring Provider: Tobie Suzzane POUR, MD Primary Care Physician:  Tobie Suzzane POUR, MD  Primary Gastroenterologist: Ozell Hollingshead, MD   Chief Complaint   Chief Complaint  Patient presents with   Follow-up    History of Present Illness   [name] is a 49 y.o. female presenting today for follow up. Last seen 04/2024. History of chronic constipation, GERD, elevated LFTs.   Since her last ov, patient elected to pursue Cologuard instead of colonoscopy. She states her insurance company advised this and sent her the kit. She has not completed yet but plans to soon.  Discussed the use of AI scribe software for clinical note transcription with the patient, who gave verbal consent to proceed.  History of Present Illness [name] is a 49 year old female with chronic constipation, GERD, COPD, and abnormal liver function tests who presents for follow-up of gastrointestinal symptoms.  Colorectal Cancer Screening: - Received a Cologuard kit from insurance for screening - Has not completed Cologuard due to loose stools on amitiza . She is worried about severe constipation if she holds medications. Plans to try to complete soon.  - She is uncertain who ordered Cologuard, was not her PCP or GI. Advised to follow up results after specimen submitted. If positive, she will need a colonoscopy. If negative, she will need colon cancer screening in 3 years.  Severe constipation: - Chronic constipation managed with Amitiza  24mcg BID. One large loose stool daily. She is happy with response and does not want dosage adjustment.   Gastroesophageal Reflux Disease (GERD): - Treated with omeprazole 40 mg twice daily - Severe heartburn occurs if a dose is missed - Once daily dosing and 20 mg over-the-counter omeprazole did not control symptoms  Abnormal Liver Function Tests: - Elevated LFTs 09/2022, see below. Since then alk phos chronically elevated.   - no recent liver  imaging. - no abdominal pain - at increased risk of fatty liver with history of HTN, HLD, DM, obesity - HCV Ab negative.  - no etoh use in over 10 years - ozempic  weekly for DM  Chronic Obstructive Pulmonary Disease (COPD) and Tobacco Use: - COPD diagnosis - Decreasing cigarette use from two packs to one pack per day - Using nicotine  patches     Wt Readings from Last 10 Encounters:  08/09/24 (!) 326 lb 9.6 oz (148.1 kg)  07/18/24 (!) 324 lb (147 kg)  06/12/24 (!) 319 lb 12.8 oz (145.1 kg)  04/24/24 (!) 315 lb 9.6 oz (143.2 kg)  03/22/24 (!) 322 lb (146.1 kg)  03/14/24 (!) 322 lb 9.6 oz (146.3 kg)  01/05/24 (!) 327 lb 6.4 oz (148.5 kg)  12/01/23 (!) 333 lb 3.2 oz (151.1 kg)  11/29/23 (!) 333 lb 3.2 oz (151.1 kg)  11/22/23 (!) 338 lb 6.4 oz (153.5 kg)     Prior Data     Results   06/12/2024: Creatinine 1.08, sodium 140, potassium 4.2, protein 7, albumin 4.3, total bilirubin 0.3, alk phos 165, AST 14, ALT 17.  A1c 6.2.  In March 2024 she presented with glucose of 611.  Her AST was 165, ALT 211, alk phos 234, total bilirubin 7.2.  Nine days later her LFTs were normal except for alk phos of 160. She has had normal LFTs in 2018.    HCV Ab negative in 09/2023.   EGD 05/2019: -esophagitis, minimal inflamed squamous mucosa -small hiatal hernia   Medications   Current Outpatient  Medications  Medication Sig Dispense Refill   albuterol  (PROVENTIL ) (2.5 MG/3ML) 0.083% nebulizer solution Take 3 mLs (2.5 mg total) by nebulization every 6 (six) hours as needed for wheezing or shortness of breath. 75 mL 5   atorvastatin  (LIPITOR) 20 MG tablet TAKE 1 TABLET BY MOUTH EVERY MORNING FOR high cholesterol 90 tablet 3   busPIRone (BUSPAR) 15 MG tablet Take 15 mg by mouth 3 (three) times daily.     clindamycin  (CLINDAGEL) 1 % gel Apply topically 2 (two) times daily. 60 g 1   COMBIVENT  RESPIMAT 20-100 MCG/ACT AERS respimat INHALE 1 PUFF INTO THE LUNGS EVERY 6 HOURS AS NEEDED FOR WHEEZING 4 g  5   Continuous Glucose Receiver (DEXCOM G7 RECEIVER) DEVI Use it to check blood glucose 3 times before meals, at bedtime and as needed. 1 each 0   Continuous Glucose Sensor (DEXCOM G7 SENSOR) MISC USE AS DIRECTED TO MONITOR BLOOD SUGAR LEVELS DAILY. CHANGE SENSOR EVERY 10 DAYS. check levels BEFORE MEALS AND AT BEDTIME AS NEEDED 3 each 5   Cyanocobalamin  (VITAMIN B-12) 2000 MCG TBCR Take 1 tablet by mouth daily.     fluticasone  (FLONASE ) 50 MCG/ACT nasal spray Place 2 sprays into both nostrils daily. 16 g 6   gabapentin  (NEURONTIN ) 600 MG tablet Take 1 tablet (600 mg total) by mouth 3 (three) times daily. 270 tablet 3   hydrochlorothiazide  (HYDRODIURIL ) 25 MG tablet Take 25 mg by mouth daily as needed (Leg swelling).     HYDROcodone -acetaminophen  (NORCO) 10-325 MG tablet TAKE 1 TABLET BY MOUTH DAILY AS NEEDED 30 tablet 0   hydrOXYzine (ATARAX) 25 MG tablet Take 25 mg by mouth 2 (two) times daily.     insulin  aspart (NOVOLOG  FLEXPEN) 100 UNIT/ML FlexPen Use sliding scale until you follow up with your PCP and establish a regimen for meals. 15 mL 11   Insulin  Pen Needle 32G X 4 MM MISC 1 Needle by Does not apply route as needed. 30 each 1   JARDIANCE 25 MG TABS tablet TAKE 1 TABLET BY MOUTH DAILY IN THE MORNING 90 tablet 3   lamoTRIgine  (LAMICTAL ) 100 MG tablet Take 100 mg by mouth 2 (two) times daily.     LANTUS  SOLOSTAR 100 UNIT/ML Solostar Pen INJECT 50 UNITS UNDER THE SKIN AT BEDTIME 75 mL 2   lubiprostone  (AMITIZA ) 24 MCG capsule Take 1 capsule (24 mcg total) by mouth 2 (two) times daily with a meal. 180 capsule 3   medroxyPROGESTERone  (PROVERA ) 10 MG tablet TAKE 1 TABLET BY MOUTH DAILY FOR THE first 10 DAYS of each calender MONTH 10 tablet 11   methocarbamol  (ROBAXIN ) 750 MG tablet TAKE 1 TABLET BY MOUTH UP TO FOUR TIMES DAILY FOR THE PAIN OF FIBROMYALGIA 120 tablet 3   nicotine  (NICODERM CQ  - DOSED IN MG/24 HOURS) 21 mg/24hr patch RX #1 Weeks 1-4: 21 mg x 1 patch daily. Wear for 24 hours. If you  have sleep disturbances, remove at bedtime. 28 patch 6   nicotine  polacrilex (COMMIT) 4 MG lozenge RX #1 Weeks 1-6: 1 lozenge every 1-2 hours. Use at least 9 lozenges per day for the first 6 weeks. Max 20 lozenges per day. 1008 lozenge 0   omeprazole (PRILOSEC) 40 MG capsule TAKE ONE CAPSULE BY MOUTH TWICE DAILY FOR acid reflux 180 capsule 3   ondansetron  (ZOFRAN ) 4 MG tablet Take 4 mg by mouth 3 (three) times daily as needed for nausea or vomiting.     OZEMPIC , 1 MG/DOSE, 4 MG/3ML SOPN INJECT  1 MG UNDER THE SKIN ONCE A WEEK 3 mL 3   prazosin (MINIPRESS) 2 MG capsule Take 2 mg by mouth at bedtime.     Prenatal Vit-Fe Fumarate-FA (PRENATAL COMPLETE PO) Take 1 tablet by mouth daily.      QUEtiapine  (SEROQUEL ) 300 MG tablet Take 300 mg by mouth at bedtime.     silver  sulfADIAZINE  (SILVADENE ) 1 % cream Use to areas 2-3 times daily as needed 50 g 11   Tiotropium Bromide-Olodaterol (STIOLTO RESPIMAT ) 2.5-2.5 MCG/ACT AERS Inhale 2 puffs into the lungs daily. 1 each 6   topiramate  (TOPAMAX ) 100 MG tablet Take 100 mg by mouth daily.     Vitamin D , Ergocalciferol , (DRISDOL ) 1.25 MG (50000 UNIT) CAPS capsule Take 1 capsule (50,000 Units total) by mouth every 7 (seven) days. 12 capsule 1   VRAYLAR 1.5 MG capsule TAKE ONE CAPSULE BY MOUTH DAILY 90 capsule 3   No current facility-administered medications for this visit.    Allergies   Allergies as of 08/09/2024 - Review Complete 08/09/2024  Allergen Reaction Noted   Penicillins Rash 12/09/2012   Sulfa antibiotics Anaphylaxis, Hives, and Swelling 12/09/2012   Latex Other (See Comments) 12/09/2012   Nisoldipine  11/30/2017   Other Other (See Comments) 04/29/2016     Past Medical History   Past Medical History:  Diagnosis Date   ADHD    Agoraphobia    Allergy 1980s   [place]   Anemia 2023   Anxiety 1988   Arthritis    Asthma 2001   Bipolar disorder (HCC)    Concussion    COPD (chronic obstructive pulmonary disease) (HCC) 2023   DDD  (degenerative disc disease), lumbar    Depression 1988   After fathers death   Diabetes mellitus without complication (HCC) 2023   Fibromyalgia    GERD (gastroesophageal reflux disease)    History of pneumonia 2015   History of substance abuse (HCC)    Hypertension 2013   Migraine    Mixed hyperlipidemia    Morbid obesity (HCC)    Neuropathy    OSA (obstructive sleep apnea)    Plantar fascial fibromatosis    Sleep apnea 2021   Spondylosis    Vitamin D  deficiency 03/27/2016    Past Surgical History   Past Surgical History:  Procedure Laterality Date   BREAST BIOPSY Left 08/08/2024   US  LT BREAST BX W LOC DEV 1ST LESION IMG BX SPEC US  GUIDE 08/08/2024 [facility]-ULTRASOUND   CYST REMOVAL HAND Left    ESOPHAGEAL DILATION     ESOPHAGOGASTRODUODENOSCOPY  2006   Dr. Rehman:small hh, esophageal dilation.   ESOPHAGOGASTRODUODENOSCOPY (EGD) WITH PROPOFOL  N/A 05/15/2019   Dr. Sander: Esophagitis with circumferential erosions or excoriations involving 10 cm segment of mid esophagus with overlying exudate.  Most proximal and distal esophagus appeared normal.  Biopsies revealed inflammation only.  Esophagus dilated.   FOOT SURGERY Right    'knot removed   MALONEY DILATION N/A 05/15/2019   Procedure: MALONEY DILATION;  Surgeon: Shaaron Lamar HERO, MD;  Location: [facility];  Service: Endoscopy;  Laterality: N/A;   MULTIPLE EXTRACTIONS WITH ALVEOLOPLASTY N/A 05/29/2016   Procedure: MULTIPLE EXTRACTIONS;  Surgeon: Glendia Primrose, DDS;  Location: [facility] OR;  Service: Oral Surgery;  Laterality: N/A;   WISDOM TOOTH EXTRACTION      Past Family History   Family History  Problem Relation Age of Onset   ADD / ADHD Brother    Hypertension Brother    Arthritis Maternal Grandmother  Hypertension Maternal Grandmother    Anemia Maternal Grandmother    Arthritis Maternal Grandfather    Asthma Maternal Grandfather    Hypertension Maternal Grandfather    Colon polyps Maternal Grandfather    Diabetes Paternal  Grandmother    Heart disease Paternal Grandmother        Deceased   Arthritis Paternal Grandmother    Breast cancer Paternal Grandmother    COPD Paternal Grandmother    Depression Paternal Grandmother    Hypertension Paternal Grandmother    Varicose Veins Paternal Grandmother    Lung cancer Paternal Grandmother    Diabetes Paternal Grandfather    Heart disease Paternal Grandfather        Deceased   Stroke Paternal Grandfather    Hypertension Paternal Grandfather    Kidney disease Paternal Grandfather    Obesity Paternal Grandfather    ADD / ADHD Daughter    Anxiety disorder Daughter    Early death Paternal Aunt    Heart disease Paternal Aunt        Died at the age of 108.   Hypertension Paternal Aunt     Past Social History   Social History   Socioeconomic History   Marital status: Single    Spouse name: Not on file   Number of children: Not on file   Years of education: Not on file   Highest education level: Associate degree: occupational, scientist, product/process development, or vocational program  Occupational History   Not on file  Tobacco Use   Smoking status: Every Day    Current packs/day: 1.00    Average packs/day: 1 pack/day for 10.0 years (10.0 ttl pk-yrs)    Types: Cigarettes   Smokeless tobacco: Never   Tobacco comments:    Pt smokes 1 ppd as of 06/12/2024  Vaping Use   Vaping status: Never Used  Substance and Sexual Activity   Alcohol use: Not Currently    Comment: heavy etoh 15 years ago (04/2024)   Drug use: Not Currently    Types: Cocaine    Comment: tried cocaine (none for 15 years (04/2024)   Sexual activity: Not Currently    Partners: Male    Birth control/protection: None, Abstinence  Other Topics Concern   Not on file  Social History Narrative   Not on file   Social Drivers of Health   Tobacco Use: High Risk (08/09/2024)   Patient History    Smoking Tobacco Use: Every Day    Smokeless Tobacco Use: Never    Passive Exposure: Not on file  Financial Resource  Strain: Low Risk (06/11/2024)   Overall Financial Resource Strain (CARDIA)    Difficulty of Paying Living Expenses: Not hard at all  Food Insecurity: No Food Insecurity (06/11/2024)   Epic    Worried About Programme Researcher, Broadcasting/film/video in the Last Year: Never true    Ran Out of Food in the Last Year: Never true  Transportation Needs: No Transportation Needs (06/11/2024)   Epic    Lack of Transportation (Medical): No    Lack of Transportation (Non-Medical): No  Physical Activity: Inactive (06/11/2024)   Exercise Vital Sign    Days of Exercise per Week: 0 days    Minutes of Exercise per Session: Not on file  Stress: No Stress Concern Present (06/11/2024)   [employer] of Occupational Health - Occupational Stress Questionnaire    Feeling of Stress: Only a little  Social Connections: Socially Isolated (06/11/2024)   Social Connection and Isolation Panel    Frequency  of Communication with Friends and Family: Once a week    Frequency of Social Gatherings with Friends and Family: Twice a week    Attends Religious Services: Never    Database Administrator or Organizations: No    Attends Engineer, Structural: Not on file    Marital Status: Never married  Intimate Partner Violence: Not At Risk (11/13/2021)   Humiliation, Afraid, Rape, and Kick questionnaire    Fear of Current or Ex-Partner: No    Emotionally Abused: No    Physically Abused: No    Sexually Abused: No  Depression (PHQ2-9): Low Risk (06/12/2024)   Depression (PHQ2-9)    PHQ-2 Score: 0  Alcohol Screen: Low Risk (11/13/2021)   Alcohol Screen    Last Alcohol Screening Score (AUDIT): 0  Housing: Low Risk (06/11/2024)   Epic    Unable to Pay for Housing in the Last Year: No    Number of Times Moved in the Last Year: 0    Homeless in the Last Year: No  Utilities: Not on file  Health Literacy: Not on file    Review of Systems   General: Negative for anorexia, weight loss, fever, chills, fatigue, weakness. ENT: Negative  for hoarseness, difficulty swallowing , nasal congestion. CV: Negative for chest pain, angina, palpitations, dyspnea on exertion, peripheral edema.  Respiratory: Negative for dyspnea at rest, dyspnea on exertion, cough, sputum, wheezing.  GI: See history of present illness. No melena, brbpr. No dysphagia GU:  Negative for dysuria, hematuria, urinary incontinence, urinary frequency, nocturnal urination.  Endo: Negative for unusual weight change.     Physical Exam   BP 109/71   Pulse (!) 102   Temp 97.9 F (36.6 C) (Oral)   Ht 5' 6 (1.676 m)   Wt (!) 326 lb 9.6 oz (148.1 kg)   LMP 09/11/2022   SpO2 94%   BMI 52.71 kg/m    General: Well-nourished, well-developed in no acute distress.  Eyes: No icterus. Mouth: Oropharyngeal mucosa moist and pink   Lungs: Clear to auscultation bilaterally.  Heart: Regular rate and rhythm, no murmurs rubs or gallops.  Abdomen: Bowel sounds are normal, nontender, nondistended, no hepatosplenomegaly or masses,  no abdominal bruits or hernia , no rebound or guarding. Limited due to body habitus Rectal: not performed Extremities: No lower extremity edema. No clubbing or deformities. Neuro: Alert and oriented x 4   Skin: Warm and dry, no jaundice.   Psych: Alert and cooperative, normal mood and affect.  Labs   See above  Imaging Studies   MM CLIP PLACEMENT LEFT Result Date: 08/08/2024 CLINICAL DATA:  Status post ultrasound-guided core needle biopsy of LEFT breast mass EXAM: 3D DIAGNOSTIC LEFT MAMMOGRAM POST ULTRASOUND BIOPSY COMPARISON:  Previous exam(s). ACR Breast Density Category b: There are scattered areas of fibroglandular density. FINDINGS: 3D Mammographic images were obtained following ultrasound guided biopsy of LEFT breast mass. The biopsy marking clip is in expected position at the site of biopsy. IMPRESSION: Appropriate positioning of the ribbon shaped biopsy marking clip at the site of biopsy in the upper LEFT breast. Final Assessment: Post  Procedure Mammograms for Marker Placement Electronically Signed   By: Aliene Lloyd M.D.   On: 08/08/2024 12:19   US  LT BREAST BX W LOC DEV 1ST LESION IMG BX SPEC US  GUIDE Result Date: 08/08/2024 CLINICAL DATA:  49 year old woman with 0.4 cm indeterminate LEFT breast mass with calcifications (12 o'clock 22 CMFN) presents for ultrasound-guided core needle biopsy. EXAM: ULTRASOUND GUIDED LEFT  BREAST CORE NEEDLE BIOPSY COMPARISON:  Previous exam(s). PROCEDURE: I met with the patient and we discussed the procedure of ultrasound-guided biopsy, including benefits and alternatives. We discussed the high likelihood of a successful procedure. We discussed the risks of the procedure, including infection, bleeding, tissue injury, clip migration, and inadequate sampling. Informed written consent was given. The usual time-out protocol was performed immediately prior to the procedure. Lesion quadrant: Upper outer quadrant Using sterile technique and 1% Lidocaine  as local anesthetic, under direct ultrasound visualization, a 14 gauge spring-loaded device was used to perform biopsy of 0.4 cm LEFT breast mass using a lateral approach. At the conclusion of the procedure ribbon shaped tissue marker clip was deployed into the biopsy cavity. Follow up 2 view mammogram was performed and dictated separately. IMPRESSION: Ultrasound guided biopsy of 0.4 cm LEFT breast mass. No apparent complications. Electronically Signed   By: Aliene Lloyd M.D.   On: 08/08/2024 12:12   MM 3D DIAGNOSTIC MAMMOGRAM BILATERAL BREAST Result Date: 07/27/2024 CLINICAL DATA:  49 year old woman presents for delayed follow-up of probably benign RIGHT breast asymmetry identified in June 2023. Annual LEFT mammogram. EXAM: DIGITAL DIAGNOSTIC BILATERAL MAMMOGRAM WITH TOMOSYNTHESIS AND CAD; ULTRASOUND LEFT BREAST LIMITED TECHNIQUE: Bilateral digital diagnostic mammography and breast tomosynthesis was performed. The images were evaluated with computer-aided detection.  ; Targeted ultrasound examination of the left breast was performed. COMPARISON:  Previous exam(s). ACR Breast Density Category b: There are scattered areas of fibroglandular density. FINDINGS: RIGHT: Mammogram: Previously seen RIGHT outer breast asymmetry has decreased in size since prior mammogram, consistent with a benign etiology. No suspicious mass, distortion, or microcalcifications are identified to suggest presence of malignancy. LEFT: Mammogram: Asymmetry with coarse heterogeneous calcifications are seen in the far posterior, upper LEFT breast. No additional suspicious mass, distortion, or microcalcifications are identified to suggest presence of malignancy. Ultrasound: Targeted sonographic evaluation of the upper breast demonstrates a mixed echogenicity shadowing mass with indistinct margins measuring 0.4 x 0.4 x 0.4 cm (12 o'clock 22 CMFN), which corresponds to the mammographic asymmetry with calcifications. IMPRESSION: 1. Indeterminate 0.4 cm LEFT breast mass with calcification (12 o'clock 22 CMFN), corresponding to asymmetry with calcifications seen on mammogram. 2. No evidence of RIGHT breast malignancy. RECOMMENDATION: Ultrasound-guided core needle biopsy of 0.4 cm LEFT breast mass (12 o'clock 22 CMFN). I have discussed the findings and recommendations with the patient. The biopsy procedure was explained to the patient and questions were answered. Patient expressed their understanding of the biopsy recommendation. Patient will be scheduled for biopsy at her earliest convenience by the schedulers. Ordering provider will be notified. If applicable, a reminder letter will be sent to the patient regarding the next appointment. BI-RADS CATEGORY  4: Suspicious. Electronically Signed   By: Aliene Lloyd M.D.   On: 07/27/2024 13:02   US  LIMITED ULTRASOUND INCLUDING AXILLA LEFT BREAST  Result Date: 07/27/2024 CLINICAL DATA:  49 year old woman presents for delayed follow-up of probably benign RIGHT breast  asymmetry identified in June 2023. Annual LEFT mammogram. EXAM: DIGITAL DIAGNOSTIC BILATERAL MAMMOGRAM WITH TOMOSYNTHESIS AND CAD; ULTRASOUND LEFT BREAST LIMITED TECHNIQUE: Bilateral digital diagnostic mammography and breast tomosynthesis was performed. The images were evaluated with computer-aided detection. ; Targeted ultrasound examination of the left breast was performed. COMPARISON:  Previous exam(s). ACR Breast Density Category b: There are scattered areas of fibroglandular density. FINDINGS: RIGHT: Mammogram: Previously seen RIGHT outer breast asymmetry has decreased in size since prior mammogram, consistent with a benign etiology. No suspicious mass, distortion, or microcalcifications are identified to suggest presence  of malignancy. LEFT: Mammogram: Asymmetry with coarse heterogeneous calcifications are seen in the far posterior, upper LEFT breast. No additional suspicious mass, distortion, or microcalcifications are identified to suggest presence of malignancy. Ultrasound: Targeted sonographic evaluation of the upper breast demonstrates a mixed echogenicity shadowing mass with indistinct margins measuring 0.4 x 0.4 x 0.4 cm (12 o'clock 22 CMFN), which corresponds to the mammographic asymmetry with calcifications. IMPRESSION: 1. Indeterminate 0.4 cm LEFT breast mass with calcification (12 o'clock 22 CMFN), corresponding to asymmetry with calcifications seen on mammogram. 2. No evidence of RIGHT breast malignancy. RECOMMENDATION: Ultrasound-guided core needle biopsy of 0.4 cm LEFT breast mass (12 o'clock 22 CMFN). I have discussed the findings and recommendations with the patient. The biopsy procedure was explained to the patient and questions were answered. Patient expressed their understanding of the biopsy recommendation. Patient will be scheduled for biopsy at her earliest convenience by the schedulers. Ordering provider will be notified. If applicable, a reminder letter will be sent to the patient  regarding the next appointment. BI-RADS CATEGORY  4: Suspicious. Electronically Signed   By: Aliene Lloyd M.D.   On: 07/27/2024 13:02    Assessment/Plan:    Assessment & Plan Colorectal cancer screening Due for screening, delayed Cologuard completion due to loose stools, average risk. - Advised completion of Cologuard, can improve stool consistency by reducing amitiza  long enough to obtain sample. - Educated on follow-up: positive Cologuard requires colonoscopy, negative requires repeat colon cancer screening in three years.  Chronic idiopathic constipation Well-controlled with Amitiza  24mcg twice daily, hesitant to reduce due to past severe constipation. -continue current dosage.   Abnormal liver function tests, possible fatty liver disease Persistent mild alkaline phosphatase elevation, possible MASLD due to metabolic risk factors, asymptomatic. - Ordered abd u/s - Ordered labs to evaluate abnormal LFTs.  Gastroesophageal reflux disease Well-controlled on omeprazole 40 mg twice daily, symptoms recur if reduced. - Reinforced continuation of omeprazole 40 mg twice daily. - Reinforced antireflux measures      Sonny RAMAN. Ezzard, MHS, PA-C [facility]  "

## 2024-08-10 ENCOUNTER — Ambulatory Visit (HOSPITAL_COMMUNITY)

## 2024-08-13 ENCOUNTER — Other Ambulatory Visit: Payer: Self-pay | Admitting: Internal Medicine

## 2024-08-13 DIAGNOSIS — M5010 Cervical disc disorder with radiculopathy, unspecified cervical region: Secondary | ICD-10-CM

## 2024-08-13 DIAGNOSIS — M51362 Other intervertebral disc degeneration, lumbar region with discogenic back pain and lower extremity pain: Secondary | ICD-10-CM

## 2024-08-15 ENCOUNTER — Other Ambulatory Visit: Payer: Self-pay | Admitting: Internal Medicine

## 2024-08-15 DIAGNOSIS — E1169 Type 2 diabetes mellitus with other specified complication: Secondary | ICD-10-CM

## 2024-08-17 ENCOUNTER — Ambulatory Visit (HOSPITAL_COMMUNITY)

## 2024-08-18 ENCOUNTER — Other Ambulatory Visit: Payer: Self-pay | Admitting: Internal Medicine

## 2024-08-18 DIAGNOSIS — E559 Vitamin D deficiency, unspecified: Secondary | ICD-10-CM

## 2024-08-29 ENCOUNTER — Ambulatory Visit (HOSPITAL_COMMUNITY)

## 2024-10-11 ENCOUNTER — Ambulatory Visit: Admitting: Internal Medicine

## 2025-01-09 ENCOUNTER — Ambulatory Visit: Admitting: Pulmonary Disease
